# Patient Record
Sex: Male | Born: 1945 | Race: White | Hispanic: No | Marital: Married | Smoking: Never smoker
Health system: Southern US, Community
[De-identification: ages and names within clinical notes are randomized; demographics above are authoritative.]

## PROBLEM LIST (undated history)

## (undated) DIAGNOSIS — I208 Other forms of angina pectoris: Secondary | ICD-10-CM

## (undated) DIAGNOSIS — N400 Enlarged prostate without lower urinary tract symptoms: Secondary | ICD-10-CM

## (undated) DIAGNOSIS — F32A Depression, unspecified: Secondary | ICD-10-CM

## (undated) DIAGNOSIS — E7800 Pure hypercholesterolemia, unspecified: Secondary | ICD-10-CM

## (undated) DIAGNOSIS — Z860101 Personal history of adenomatous and serrated colon polyps: Secondary | ICD-10-CM

## (undated) DIAGNOSIS — Z9889 Other specified postprocedural states: Secondary | ICD-10-CM

## (undated) DIAGNOSIS — I2089 Other forms of angina pectoris: Secondary | ICD-10-CM

## (undated) DIAGNOSIS — R011 Cardiac murmur, unspecified: Secondary | ICD-10-CM

## (undated) DIAGNOSIS — E291 Testicular hypofunction: Secondary | ICD-10-CM

## (undated) DIAGNOSIS — Z9289 Personal history of other medical treatment: Secondary | ICD-10-CM

## (undated) DIAGNOSIS — C4491 Basal cell carcinoma of skin, unspecified: Secondary | ICD-10-CM

## (undated) DIAGNOSIS — J309 Allergic rhinitis, unspecified: Secondary | ICD-10-CM

## (undated) DIAGNOSIS — I10 Essential (primary) hypertension: Secondary | ICD-10-CM

## (undated) DIAGNOSIS — J302 Other seasonal allergic rhinitis: Secondary | ICD-10-CM

## (undated) DIAGNOSIS — M5030 Other cervical disc degeneration, unspecified cervical region: Secondary | ICD-10-CM

## (undated) DIAGNOSIS — Z859 Personal history of malignant neoplasm, unspecified: Secondary | ICD-10-CM

## (undated) DIAGNOSIS — Z85828 Personal history of other malignant neoplasm of skin: Secondary | ICD-10-CM

## (undated) DIAGNOSIS — M1990 Unspecified osteoarthritis, unspecified site: Secondary | ICD-10-CM

## (undated) DIAGNOSIS — F419 Anxiety disorder, unspecified: Secondary | ICD-10-CM

## (undated) DIAGNOSIS — K219 Gastro-esophageal reflux disease without esophagitis: Secondary | ICD-10-CM

## (undated) DIAGNOSIS — N529 Male erectile dysfunction, unspecified: Secondary | ICD-10-CM

## (undated) DIAGNOSIS — Z86010 Personal history of colonic polyps: Secondary | ICD-10-CM

## (undated) HISTORY — PX: CARDIAC CATHETERIZATION: SHX172

## (undated) HISTORY — PX: EYE SURGERY: SHX253

## (undated) HISTORY — DX: Male erectile dysfunction, unspecified: N52.9

## (undated) HISTORY — PX: MOHS SURGERY: SUR867

## (undated) HISTORY — PX: INGUINAL HERNIA REPAIR: SUR1180

## (undated) HISTORY — DX: Other seasonal allergic rhinitis: J30.2

## (undated) HISTORY — DX: Basal cell carcinoma of skin, unspecified: C44.91

## (undated) HISTORY — DX: Pure hypercholesterolemia, unspecified: E78.00

## (undated) HISTORY — PX: FINGER SURGERY: SHX640

## (undated) HISTORY — DX: Gastro-esophageal reflux disease without esophagitis: K21.9

## (undated) HISTORY — DX: Other forms of angina pectoris: I20.8

## (undated) HISTORY — DX: Allergic rhinitis, unspecified: J30.9

## (undated) HISTORY — DX: Other forms of angina pectoris: I20.89

## (undated) HISTORY — DX: Essential (primary) hypertension: I10

## (undated) HISTORY — PX: KNEE ARTHROSCOPY: SHX127

## (undated) HISTORY — PX: JOINT REPLACEMENT: SHX530

## (undated) HISTORY — PX: APPENDECTOMY: SHX54

## (undated) HISTORY — PX: PROSTATE BIOPSY: SHX241

## (undated) SURGERY — MONITORING, ESOPHAGEAL PH, 24 HOUR

---

## 1966-12-03 HISTORY — PX: RECONSTRUCTION OF NOSE: SHX2301

## 1970-12-03 HISTORY — PX: KNEE SURGERY: SHX244

## 2002-04-16 ENCOUNTER — Observation Stay (HOSPITAL_COMMUNITY): Admission: EM | Admit: 2002-04-16 | Discharge: 2002-04-17 | Payer: Self-pay | Admitting: Emergency Medicine

## 2002-04-16 ENCOUNTER — Encounter: Payer: Self-pay | Admitting: Emergency Medicine

## 2002-11-02 ENCOUNTER — Ambulatory Visit (HOSPITAL_BASED_OUTPATIENT_CLINIC_OR_DEPARTMENT_OTHER): Admission: RE | Admit: 2002-11-02 | Discharge: 2002-11-02 | Payer: Self-pay | Admitting: *Deleted

## 2002-11-02 ENCOUNTER — Encounter (INDEPENDENT_AMBULATORY_CARE_PROVIDER_SITE_OTHER): Payer: Self-pay | Admitting: *Deleted

## 2004-11-14 ENCOUNTER — Ambulatory Visit (HOSPITAL_BASED_OUTPATIENT_CLINIC_OR_DEPARTMENT_OTHER): Admission: RE | Admit: 2004-11-14 | Discharge: 2004-11-14 | Payer: Self-pay | Admitting: Orthopedic Surgery

## 2004-11-14 ENCOUNTER — Ambulatory Visit (HOSPITAL_COMMUNITY): Admission: RE | Admit: 2004-11-14 | Discharge: 2004-11-14 | Payer: Self-pay | Admitting: Orthopedic Surgery

## 2007-12-19 ENCOUNTER — Ambulatory Visit (HOSPITAL_BASED_OUTPATIENT_CLINIC_OR_DEPARTMENT_OTHER): Admission: RE | Admit: 2007-12-19 | Discharge: 2007-12-19 | Payer: Self-pay | Admitting: Orthopedic Surgery

## 2010-12-19 ENCOUNTER — Ambulatory Visit
Admission: RE | Admit: 2010-12-19 | Discharge: 2010-12-19 | Payer: Self-pay | Source: Home / Self Care | Attending: Orthopedic Surgery | Admitting: Orthopedic Surgery

## 2011-04-17 NOTE — Op Note (Signed)
NAME:  Dale [name], Dale [name] NO.:  [id number]   MEDICAL RECORD NO.:  [id number]          PATIENT TYPE:  AMB   LOCATION:  DSC                          FACILITY:  [facility]   PHYSICIAN:  Katy Fitch. Sypher, M.D. DATE OF BIRTH:  12-22-1945   DATE OF PROCEDURE:  12/19/2007  DATE OF DISCHARGE:                               OPERATIVE REPORT   PREOPERATIVE DIAGNOSIS:  Chronic swelling of left index finger DIP  (distal interphalangeal) joint with osteochondral loose body and  multiple mucoid cyst formation on dorsal nail fold.   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   OPERATION:  1. Arthrotomy of left index finger DIP joint with removal of large      osteochondral loose body measuring 6 x 4 mm.  2. Debridement of DIP joint of cartilage debris and removal of      osteophytes.  3. Debridement of mucoid cyst left index finger.   SURGEON:  Katy Fitch. Sypher, M.D.   ASSISTANT:  Jonni Sanger, P.A.   ANESTHESIA:  Two percent lidocaine metacarpal head level block left  index finger supplemented by IV sedation.   SUPERVISING ANESTHESIOLOGIST:  Dr. Randa Evens.   INDICATIONS:  Dale [name] is a 65 year old gentleman who has had  a history of degenerative arthritis and mucoid cyst development.  He had  a large osteophyte fracture within the left index finger DIP joint  causing an effusion and development of mucoid cyst.   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  Preoperatively he was advised of the potential risks and  benefits of surgery.  He understands we cannot alter the natural history  of his degenerative arthritis.   PROCEDURE IN DETAIL:  Dale [name] is brought to the operating  room and placed in supine position on the  operating table.   Following placement of a 2% lidocaine metacarpal head level block  satisfactory anesthesia of the left index finger was achieved.  The left  arm was prepped with Betadine soap solution and sterilely draped.  A  pneumatic tourniquet was deferred.  The left index finger was  exsanguinated with a gauze wrap and a 1/2 inch Penrose drain placed over  the proximal phalangeal segment as a digital tourniquet.   The procedure commenced with parallel incisions on the dorsal ulnar and  dorsoradial aspect of the joint.  Subcutaneous tissue were carefully  divided taking care to perform a synovectomy.  The mucoid cyst was  identified distally, drained of its contents and curetted to remove its  pseudo capsule.  The joint was entered on the dorsal ulnar aspect.  Marginal osteophytes were removed at the base of the distal phalanx and  head of the middle phalanx.  The large loose body was adherent to the  undersurface of the extensor tendon.  This was teased off the tendon  with a Therapist, nutritional followed by piecemeal removal.  The dorsoradial  aspect  of the joint was opened and marginal osteophytes removed at the  base of the distal phalanx and head of the middle phalanx followed by  thorough irrigation with a blunt dental needle.   The mucoid cysts were fully decompressed.  The joint was debrided  thoroughly.  There was bone-on-bone arthropathy at the distal  interphalangeal joint.   The wounds were then repaired with simple suture of 5-0 nylon.  A  compressive dressing was applied with Xeroflo sterile gauze and Coban.  Mr. Aikey was placed on doxycycline 100 mg p.o. b.i.d. as a  postoperative prophylactic antibiotic and will also use Vicodin 5 mg one  p.o. q.4-6 h p.r.n. pain 20 tablets without refill.      Katy Fitch Sypher, M.D.  Electronically Signed     RVS/MEDQ  D:  12/19/2007  T:  12/19/2007  Job:  [id number]   cc:   C. Duane Lope, M.D.

## 2011-04-20 NOTE — Op Note (Signed)
NAME:  Dale [name], Dale [name] NO.:  [id number]   MEDICAL RECORD NO.:  [id number]          PATIENT TYPE:  AMB   LOCATION:  DSC                          FACILITY:  [facility]   PHYSICIAN:  Katy Fitch. 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   OPERATION PERFORMED:  1.  Irrigation and debridement of distal interphalangeal joint with removal      of marginal osteophytes on the adjacent surfaces of the distal and      middle phalanges on the dorsal radial and dorsal ulnar aspect of the      right long finger distal interphalangeal joint.  2.  Resection of a subcutaneous mucous cyst along the radial aspect of the      right long finger nail fold.   SURGEON:  Katy Fitch. Sypher, M.D.   ASSISTANT:  Jonni Sanger, P.A.   ANESTHESIA:  0.25% Marcaine and 2% lidocaine metacarpal head level block of  right long finger supplemented by IV sedation.   SUPERVISING ANESTHESIOLOGIST:  Maren Beach, M.D.   INDICATIONS FOR PROCEDURE:  Dale [name] is a 65 year old man referred  by Norval Gable. [place], M.D. and Al Decant. Janey Greaser, MD for evaluation and  management of a right long finger nail deformity and chronic mucous cyst  predicament.  He has well recognized advanced osteoarthrosis of his distal  interphalangeal joints of virtually all of his fingers.  He developed a  draining mucous cyst in the past and has had prior incision and drainage  elsewhere.  He was left with a rather prominent nail  deformity.  At the  suggestion of Dr. Londell Moh, he sought a hand surgery consult.  On the  clinical examination, he was noted to have a very irregular nail plate due  to probable scarring of the nail fold as well as pressure from the mucous  cyst.  He had a palpable cyst along the dorsal radial aspect of his right  long finger distal interphalangeal joint and nail fold.  We offered  resection of the mucous cyst and joint debridement in an effort to prevent  recurrences of the mucous cyst phenomenon.  Preoperatively, he was carefully  advised that we could not guarantee that his nail deformity would improve as  this may be due to chronic scarring from his prior infection and or prior  incision and drainage.  Both he and his wife completely understood this  issue.  We did however, offer joint debridement at this time in an effort to  prevent further mucous cyst formation and drainage.  After informed consent,  Dale [name] is brought to the operating room at this time.  DESCRIPTION OF PROCEDURE:  Dale [name] was brought to the operating  room and placed in supine position on the operating table.  Following light  IV sedation, the right arm was prepped with Betadine soap and solution and  sterilely draped.  A metacarpal head level block was placed with 0.25%  Marcaine and 2% lidocaine.  When anesthesia was satisfactory, the long  finger was exsanguinated with a gauze wrap and a half inch Penrose drain  placed at the P1 segment as a digital tourniquet.  The procedure commenced  with exposure of the distal interphalangeal joint and extensor mechanism  through a lazy S incision.  Subcutaneous tissue were carefully divided  taking care to spare the dorsal sensory nerves and the dorsal veins.  A  triangular resection of the capsule between the proper radial collateral  ligament and the extensor mechanism and the proper ulnar collateral ligament  and extensor mechanism was accomplished  with a 15 blade.  Marginal  osteophytes at the base of the distal phalanx and middle phalanx were  debrided with a microcurette and fine rongeur.  The joint was then irrigated  through-and-through with saline until the effluent was clear of debris and  mucinous material.  A fine rongeur was then used to debride the subcutaneous  region of the radial nail fold.  Care was taken not to injure the dorsal,  intermediate or ventral nail matrix.  The wound was carefully inspected for  bleeding points and subsequently repaired with mattress sutures of 5-0  nylon.  A compressive dressing was applied with Xeroflo, sterile gauze and a  Coban dressing.  Dale [name] was given 1 g of Ancef as an IV  prophylactic antibiotic anticipating intra-articular procedure, also this  was indicated as he had a past history of infection of the nail fold.   For aftercare he was placed on Levaquin 500 mg one by mouth daily times four  days as a prophylactic antibiotic.  He was also given Vicodin 5 mg one by  mouth every four to six hours as needed for pain, 20 tablets without refill.  He will return to our office for follow-up in one week or sooner as needed  for problems.  He is carefully advised to keep his finger dressing  completely dry.      [place]obe   RVS/MEDQ  D:  11/14/2004  T:  11/14/2004  Job:  [id number]   cc:   Norval Gable. [place], M.D.  [address] [place]  [place] [address]  Fax: [phone]   Al Decant. Janey Greaser, MD  [address]  [place]  [place] [address]  Fax: [phone]

## 2011-04-20 NOTE — Op Note (Signed)
NAME:  Dale [name], Dale [name]                    ACCOUNT NO.:  [id number]   MEDICAL RECORD NO.:  [id number]                   PATIENT TYPE:  AMB   LOCATION:  DSC                                  FACILITY:  [facility]   PHYSICIAN:  Maisie Fus B. Samuella Cota, M.D.               DATE OF BIRTH:  10/19/1946   DATE OF PROCEDURE:  11/02/2002  DATE OF DISCHARGE:                                 OPERATIVE REPORT   CCS# [id number]   PREOPERATIVE DIAGNOSIS:  Right inguinal hernia.   POSTOPERATIVE DIAGNOSIS:  Right inguinal hernia.   OPERATION:  Repair of right inguinal hernia with mesh.   SURGEON:  Maisie Fus B. Samuella Cota, M.D.   ANESTHESIA:  Local (1% Xylocaine without epinephrine, 0.25% Marcaine without  epinephrine and sodium bicarbonate) with anesthesia monitoring.   ANESTHESIOLOGIST:  Bedelia Person, M.D. and CRNA   DESCRIPTION OF PROCEDURE:  The patient was taken to the operating room and  placed on the table in the supine position.  The right lower quadrant of the  abdomen was prepped and draped as a sterile field.  The patient had a  previous left inguinal herniorrhaphy, given a block incision.  A matching  incision was outlined with a skin marker.  The local was then used to block  the ilioinguinal nerve in the area for the incision.  Once the ilioinguinal  nerve was exposed, it was blocked directly.  The incision was made through  the skin and subcutaneous tissue with subcutaneous bleeders being cauterized  with the Bovie or ligated with 3-0 Vicryl.  External oblique aponeurosis was  divided in the line of its fibers to include the external ring.  The  ilioinguinal nerve was seen and retracted laterally.  The cord structures  were isolated with a Penrose drain.  The patient was noted to have fairly  large direct inguinal hernia coming off below the recurrent epigastric  vessels.  Above the recurrent epigastric vessels, the patient did have an  indirect hernia sac which was very thin walled.  This was dissected  free  from the cord structures.  The sac was opened and some small bowel was noted  in the abdominal cavity but no bowel was adherent to the hernia sac.  Under  direct vision, the high ligation was carried out with the suture ligature of  0 chromic catgut.  The stump was allowed to retract beneath the transversus  muscle.  The direct hernia was then imbricated with a running suture of 0  chromic catgut.  A piece of 3 inch x 6 inch atria mesh was then fashioned to  cover the entire inguinal floor and extend around the cord structure  superiorly and laterally.  The mesh was anchored inferiorly with a running  suture of 2-0 Novofil with the first suture being placed in the pubic  tubercle.  The other sutures in the shelving edge of Poupart's ligament.  The mesh was anchored anteriorly superiorly and  medially with interrupted  sutures of 0 Novofil.  A slit was made in the mesh to accommodate the cord  structures and the ilioinguinal nerve at the internal ring.  The mesh was  then sutured to itself superior and lateral to the internal ring.  The mesh  seemed to be lying nicely covering the inguinal floor without too much  tension.  The cord structures and ilioinguinal nerve were then returned to  their normal anatomical position and the external oblique aponeurosis was  reapproximated with interrupted sutures of 3-0 Vicryl.  The external ring  would easily admit the tip of the index finger.  Scarpa's fascia  was then closed with 3-0 Vicryl and the skin was closed with a running  subcuticular suture of 4-0 Monocryl.  Benzoin and 1/2 inch Steri-Strips were  used to reinforce the skin closure.  Dry sterile dressing was applied.  The  patient seemed to tolerate the procedure well and was taken to the PACU in  satisfactory condition.                                               Tailor B. Samuella Cota, M.D.    TBP/MEDQ  D:  11/02/2002  T:  11/02/2002  Job:  [id number]   cc:   Al Decant. Janey Greaser, M.D.   [address]  [place]  [place] [address]  Fax: [phone]

## 2011-08-23 LAB — POCT HEMOGLOBIN-HEMACUE: Hemoglobin: 14.2

## 2012-08-21 ENCOUNTER — Other Ambulatory Visit: Payer: Self-pay | Admitting: Family Medicine

## 2012-08-21 DIAGNOSIS — Z0000 Encounter for general adult medical examination without abnormal findings: Secondary | ICD-10-CM

## 2012-08-25 ENCOUNTER — Ambulatory Visit
Admission: RE | Admit: 2012-08-25 | Discharge: 2012-08-25 | Disposition: A | Discharge status: Home / Self Care | Payer: Medicare Other | Source: Ambulatory Visit | Attending: Family Medicine | Admitting: Family Medicine

## 2012-08-25 DIAGNOSIS — Z0000 Encounter for general adult medical examination without abnormal findings: Secondary | ICD-10-CM

## 2012-08-25 IMAGING — US US AORTA SCREENING (MEDICARE)
1 series · 14 of 14 positions shown · non-contrast
Comparison: None.

CLINICAL DATA: Routine general medical exam.  Screening for AAA.

ABDOMINAL AORTA SCREENING ULTRASOUND
TECHNIQUE: Ultrasound examination of the abdominal aorta was
performed as a screening evaluation for abdominal aortic aneurysm.

[Series 1: us aorta screening (medicare) · 0.30mm/px · 14 of 14 slices shown]
[im 1/14]
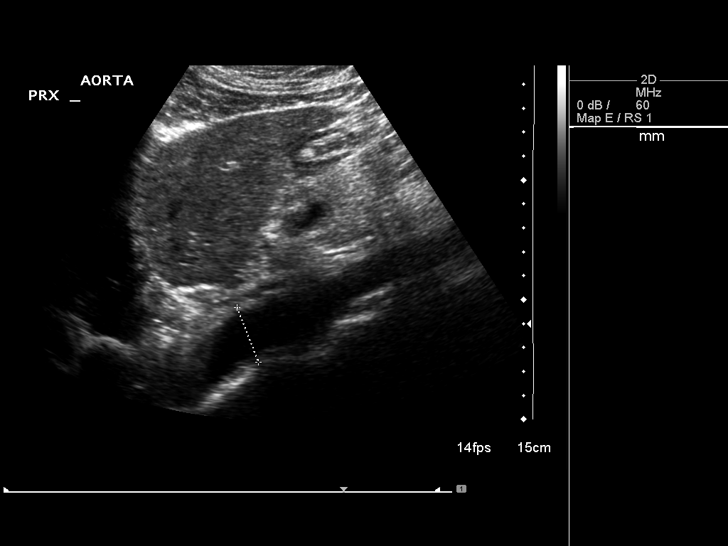
[im 2/14]
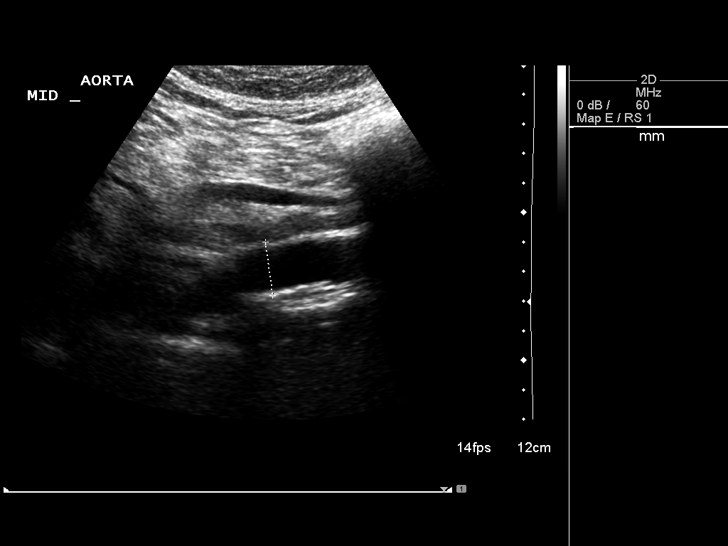
[im 3/14]
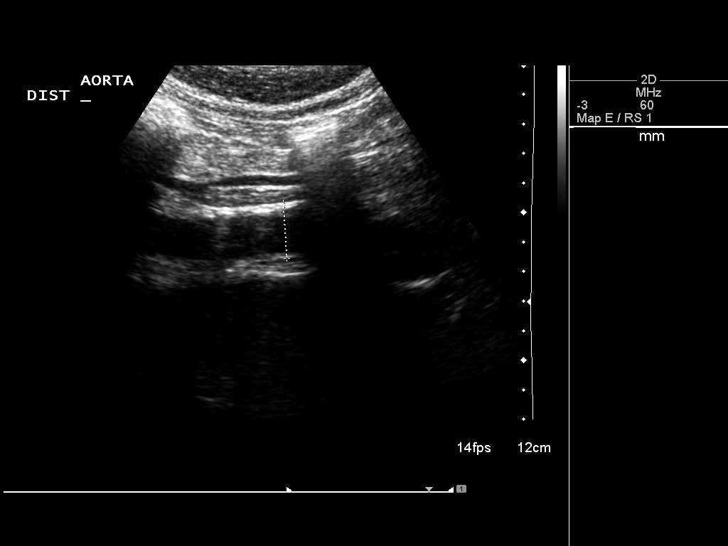
[im 4/14]
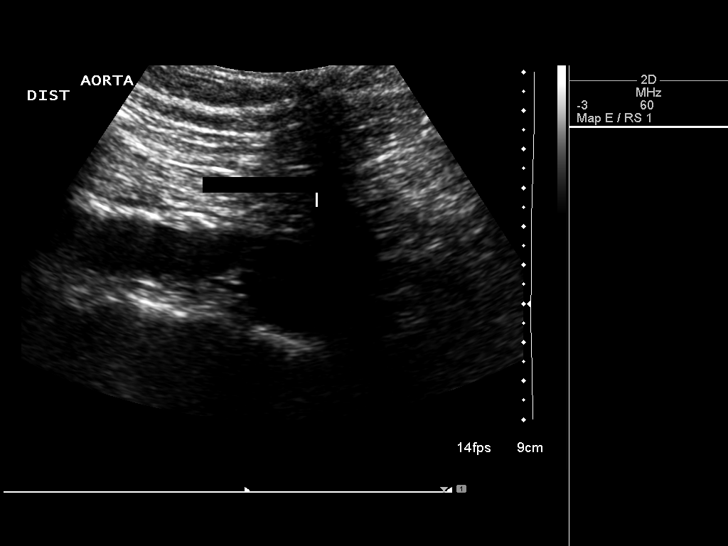
[im 5/14]
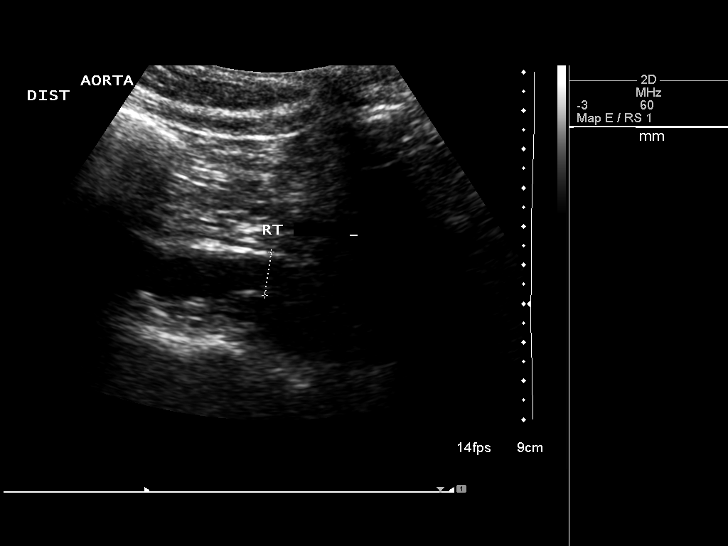
[im 6/14]
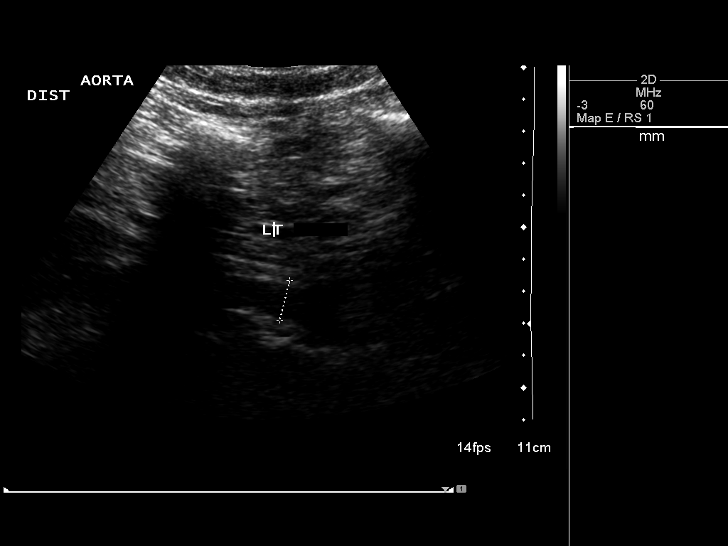
[im 7/14]
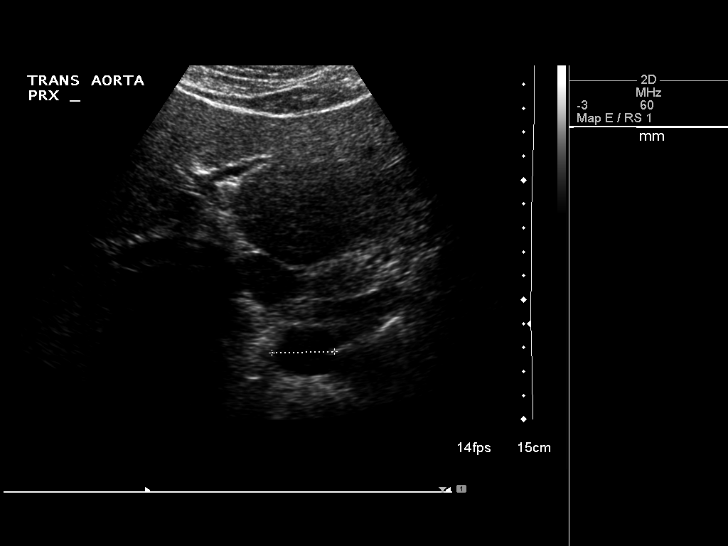
[im 8/14]
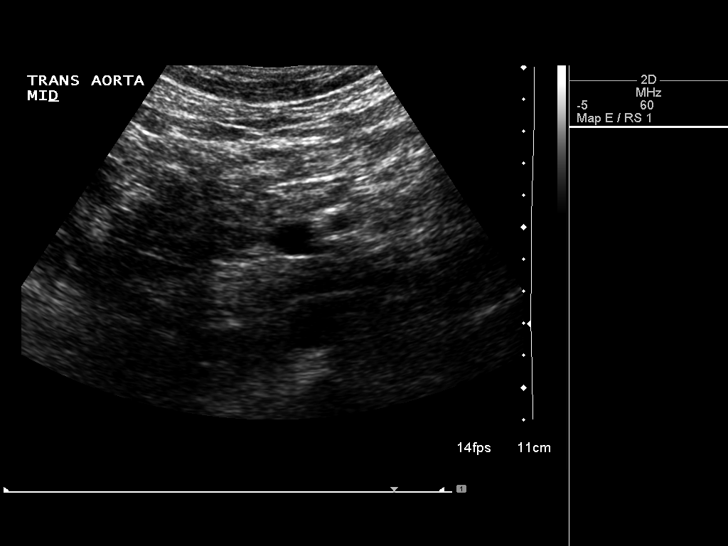
[im 9/14]
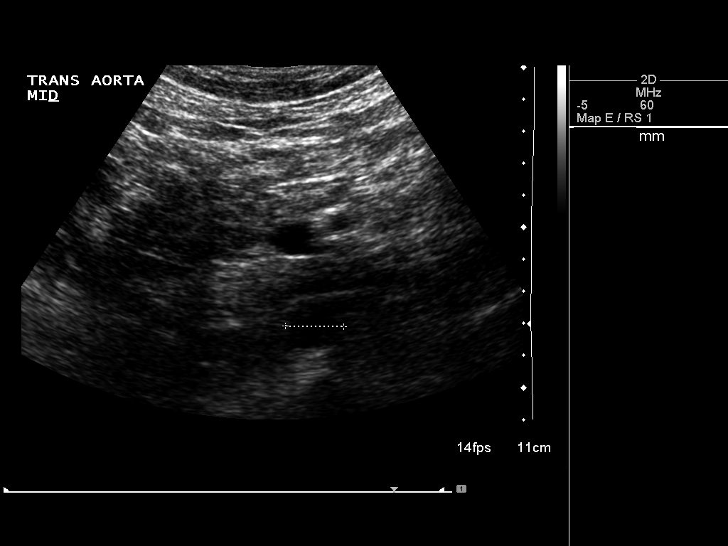
[im 10/14]
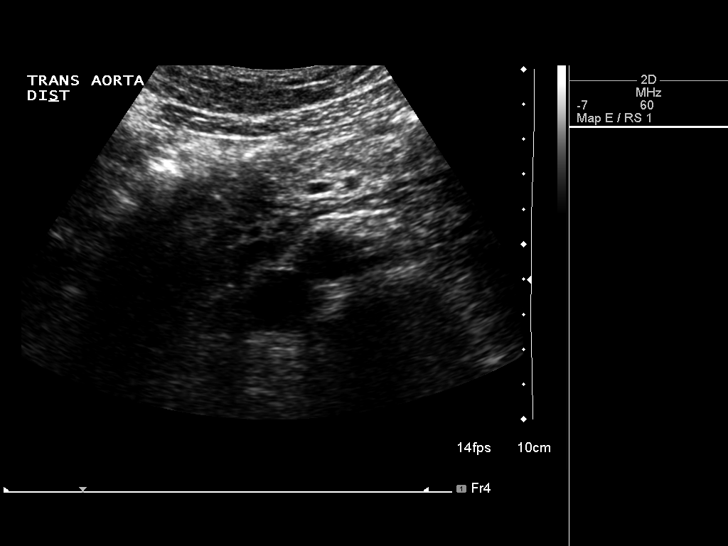
[im 11/14]
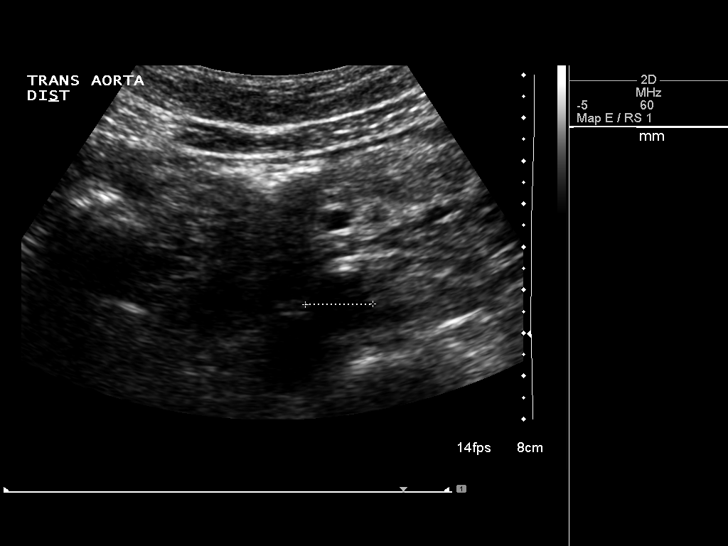
[im 12/14]
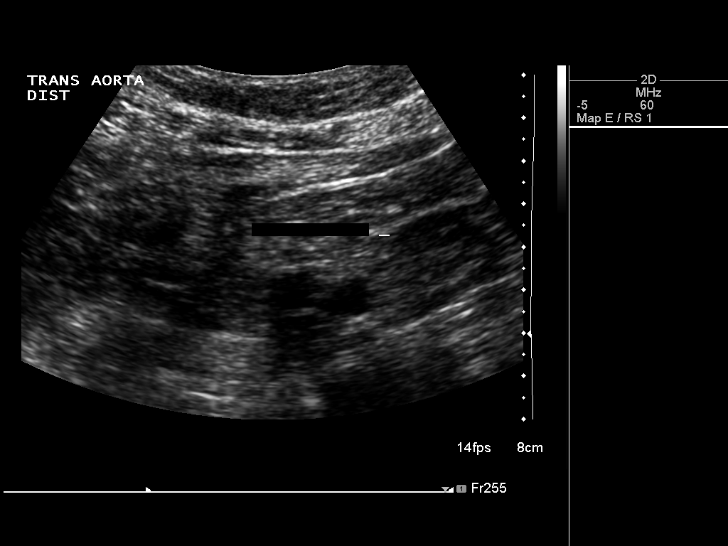
[im 13/14]
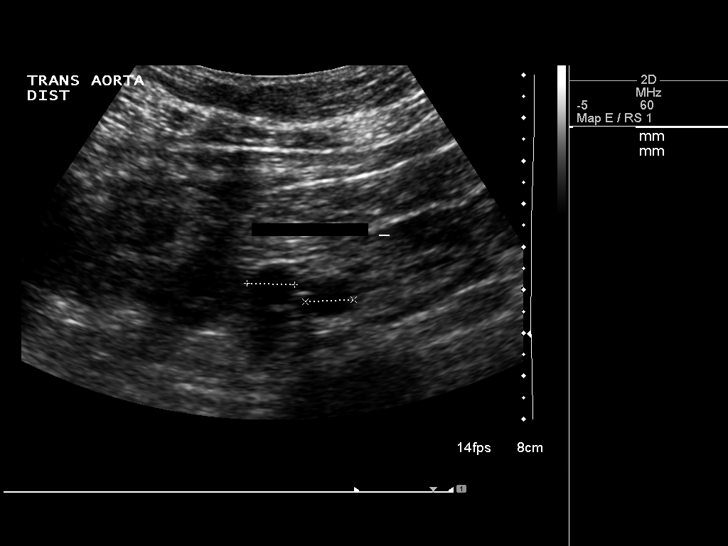
[im 14/14]
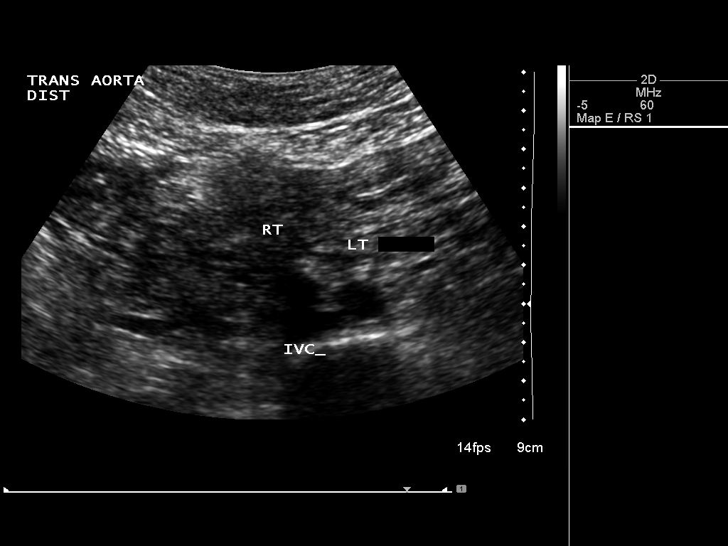

[14 of 14 positions shown; findings below may reference images not displayed]

Abdominal Aorta: No aneurysm identified.

      Maximum AP diameter:  2.5 cm.
      Maximum TRV diameter:  2.6 cm.
IMPRESSION: No abdominal aortic aneurysm identified.

## 2012-12-24 ENCOUNTER — Other Ambulatory Visit: Payer: Self-pay | Admitting: Family Medicine

## 2012-12-24 DIAGNOSIS — R1031 Right lower quadrant pain: Secondary | ICD-10-CM

## 2012-12-26 ENCOUNTER — Ambulatory Visit
Admission: RE | Admit: 2012-12-26 | Discharge: 2012-12-26 | Disposition: A | Discharge status: Home / Self Care | Payer: Medicare Other | Source: Ambulatory Visit | Attending: Family Medicine | Admitting: Family Medicine

## 2012-12-26 DIAGNOSIS — R1031 Right lower quadrant pain: Secondary | ICD-10-CM

## 2012-12-26 IMAGING — US US ABDOMEN COMPLETE
1 series · 14 of 25 positions shown · non-contrast
Comparison: [DATE]

CLINICAL DATA: Right lower quadrant pain.

COMPLETE ABDOMINAL ULTRASOUND

[Series 1: us abdomen complete · 0.37mm/px · 14 of 76 slices shown]
[im 1/76]
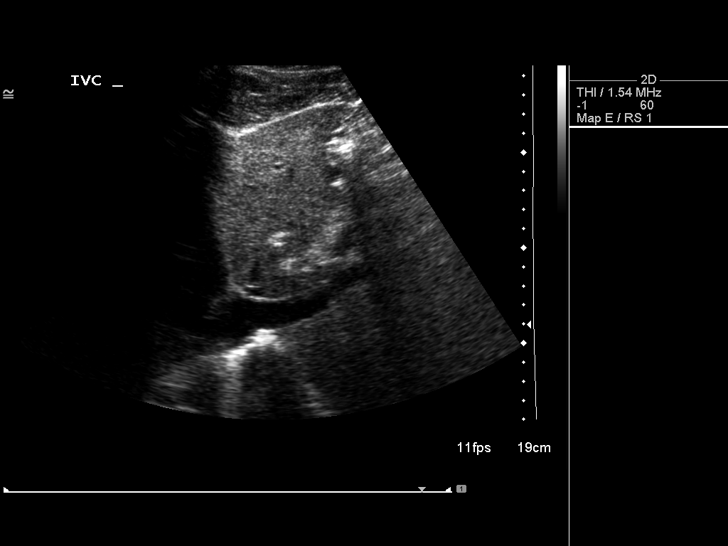
[im 7/76]
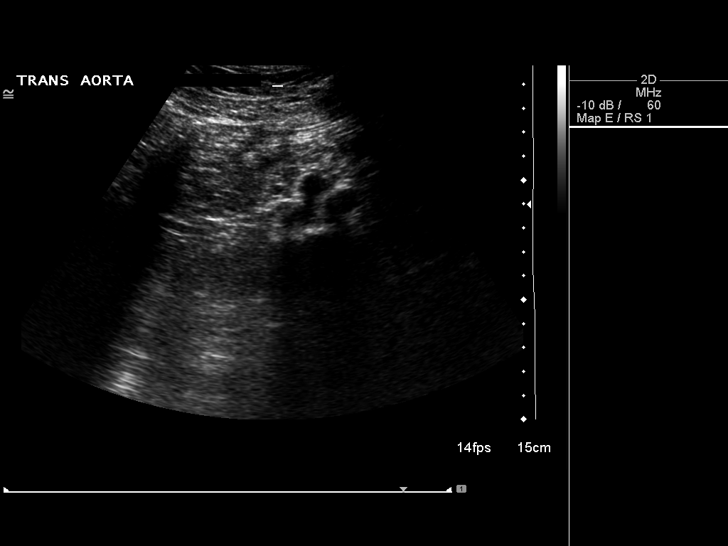
[im 13/76]
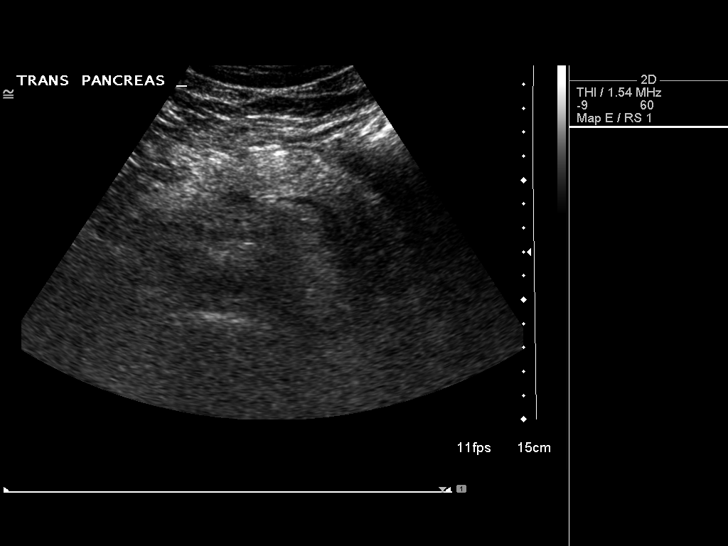
[im 19/76]
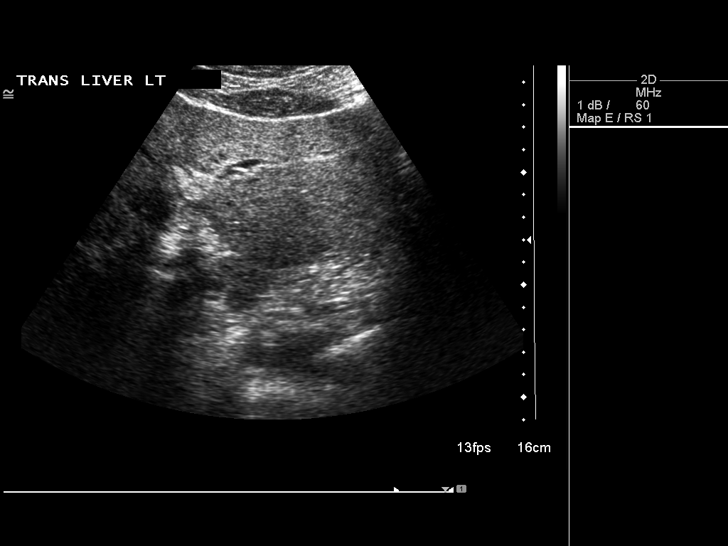
[im 26/76]
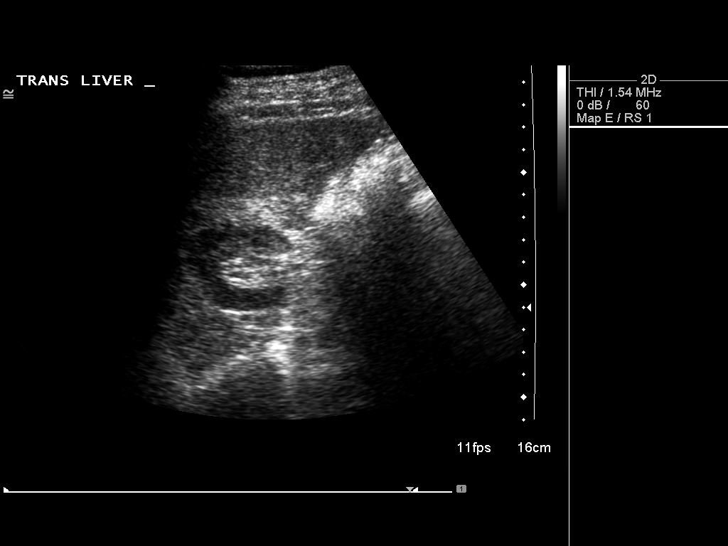
[im 29/76]
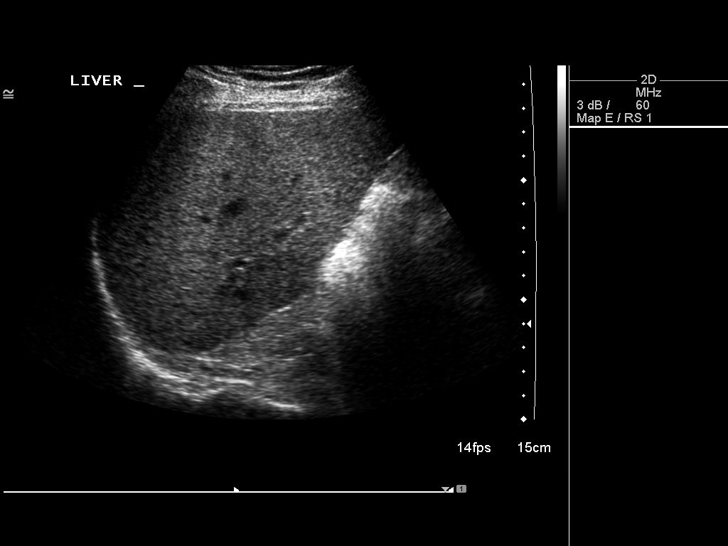
[im 35/76]
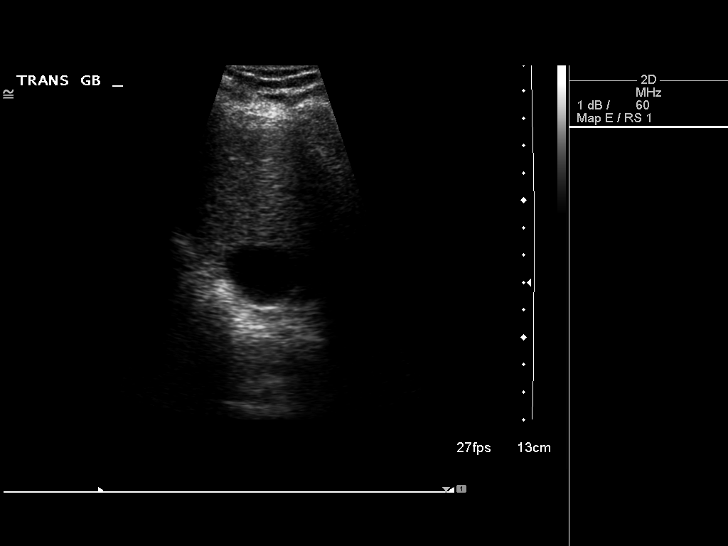
[im 41/76]
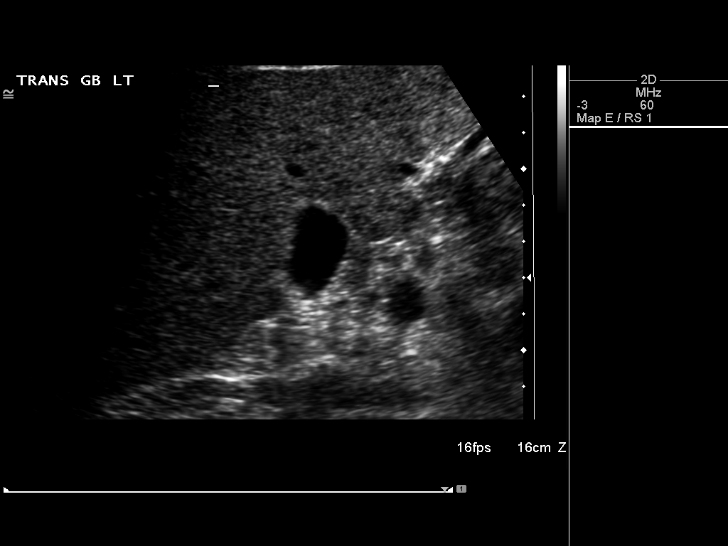
[im 47/76]
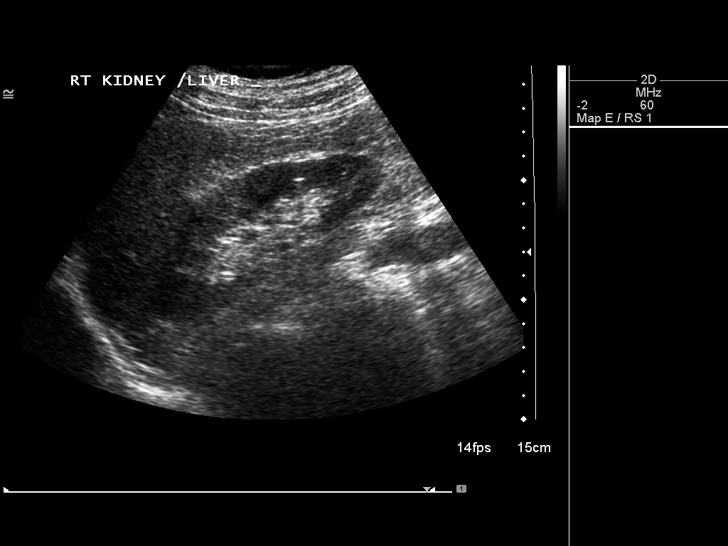
[im 51/76]
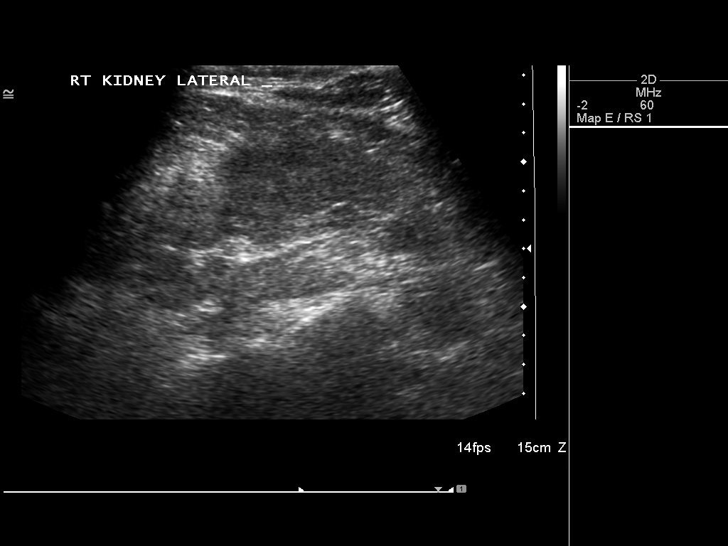
[im 57/76]
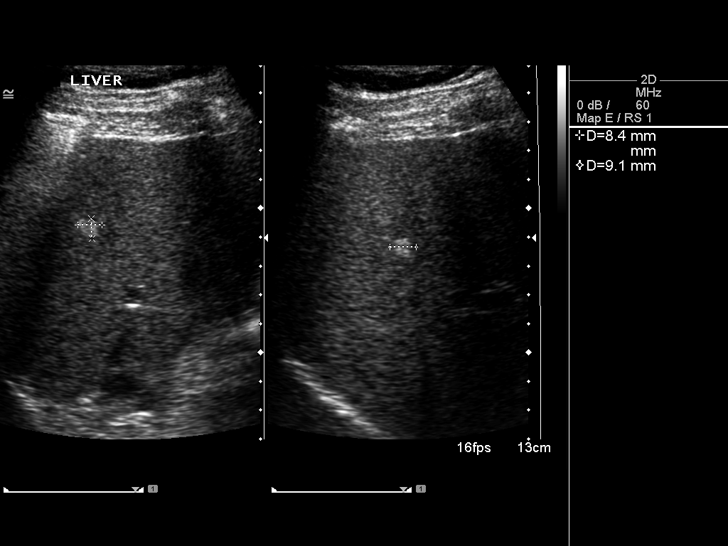
[im 63/76]
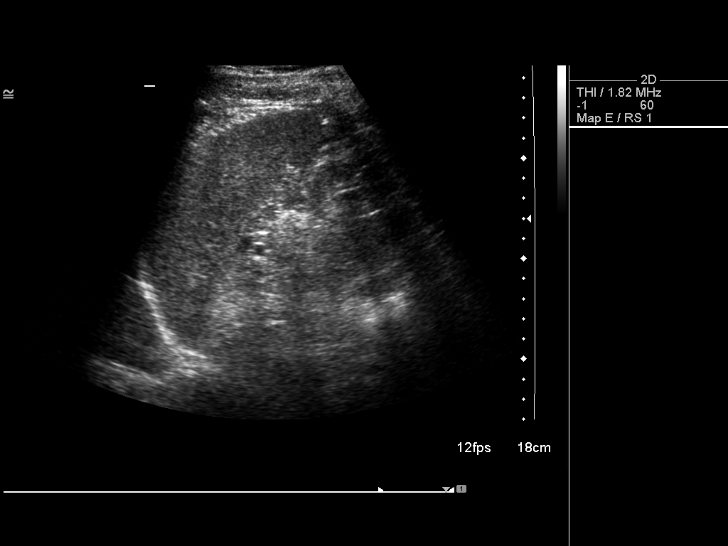
[im 69/76]
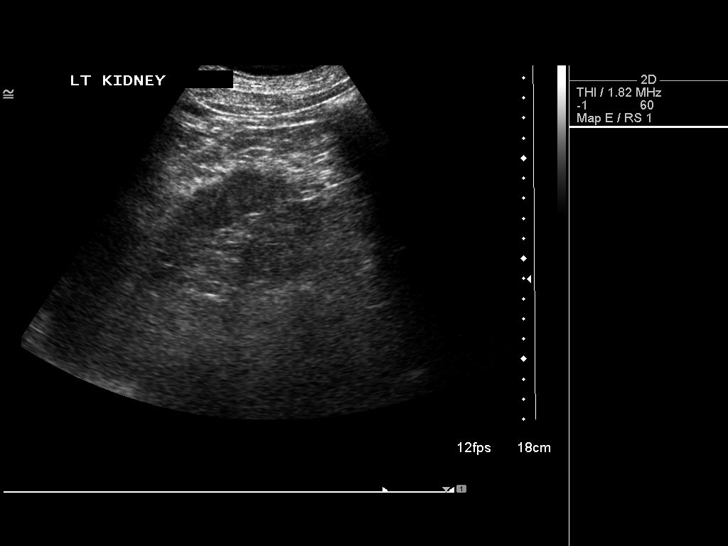
[im 76/76]
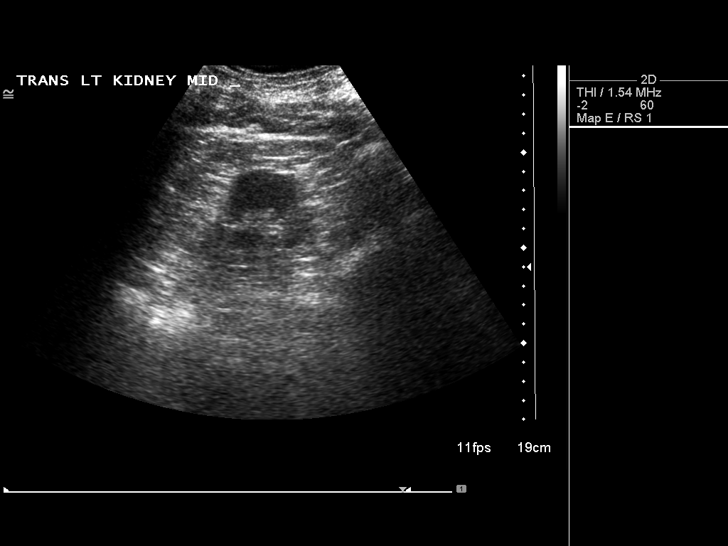

[14 of 25 positions shown; findings below may reference images not displayed]

FINDINGS: Gallbladder:  No stones or wall thickening.  Negative sonographic
CHANNING.

Common bile duct:   Normal caliber, 4 mm.

Liver:  9 mm hyperechoic focus within the right lobe of the liver,
likely small hemangioma.

IVC:  Appears normal.

Pancreas:  Suboptimal visualization of the head and tail.
Pancreatic body unremarkable.

Spleen:  Within normal limits in size and echotexture.

Right Kidney:   Normal in size and parenchymal echogenicity.  No
evidence of mass or hydronephrosis.

Left Kidney:  Normal in size and parenchymal echogenicity.  No
evidence of mass or hydronephrosis.

Abdominal aorta:  No aneurysm.
IMPRESSION: No acute findings in the abdomen.

Small hyperechoic area within the right lobe of the liver, likely
hemangioma.

## 2013-04-24 ENCOUNTER — Other Ambulatory Visit: Payer: Self-pay | Admitting: Interventional Cardiology

## 2013-04-28 ENCOUNTER — Ambulatory Visit (HOSPITAL_COMMUNITY)
Admission: RE | Admit: 2013-04-28 | Payer: Medicare Other | Source: Ambulatory Visit | Admitting: Interventional Cardiology

## 2013-04-28 ENCOUNTER — Encounter (HOSPITAL_COMMUNITY): Admission: RE | Payer: Self-pay | Source: Ambulatory Visit

## 2013-04-28 SURGERY — LEFT HEART CATHETERIZATION WITH CORONARY ANGIOGRAM
Anesthesia: LOCAL

## 2013-05-01 ENCOUNTER — Inpatient Hospital Stay (HOSPITAL_BASED_OUTPATIENT_CLINIC_OR_DEPARTMENT_OTHER)
Admission: RE | Admit: 2013-05-01 | Payer: Medicare Other | Source: Ambulatory Visit | Admitting: Interventional Cardiology

## 2013-05-01 ENCOUNTER — Encounter (HOSPITAL_COMMUNITY)
Admission: RE | Disposition: A | Discharge status: Home / Self Care | Payer: Self-pay | Source: Ambulatory Visit | Attending: Interventional Cardiology

## 2013-05-01 ENCOUNTER — Encounter (HOSPITAL_BASED_OUTPATIENT_CLINIC_OR_DEPARTMENT_OTHER): Admission: RE | Payer: Self-pay | Source: Ambulatory Visit

## 2013-05-01 ENCOUNTER — Ambulatory Visit (HOSPITAL_COMMUNITY)
Admission: RE | Admit: 2013-05-01 | Discharge: 2013-05-01 | Disposition: A | Discharge status: Home / Self Care | Payer: Medicare Other | Source: Ambulatory Visit | Attending: Interventional Cardiology | Admitting: Interventional Cardiology

## 2013-05-01 DIAGNOSIS — Z79899 Other long term (current) drug therapy: Secondary | ICD-10-CM | POA: Insufficient documentation

## 2013-05-01 DIAGNOSIS — I2510 Atherosclerotic heart disease of native coronary artery without angina pectoris: Secondary | ICD-10-CM | POA: Insufficient documentation

## 2013-05-01 DIAGNOSIS — E7800 Pure hypercholesterolemia, unspecified: Secondary | ICD-10-CM | POA: Insufficient documentation

## 2013-05-01 DIAGNOSIS — Z823 Family history of stroke: Secondary | ICD-10-CM | POA: Insufficient documentation

## 2013-05-01 DIAGNOSIS — Z791 Long term (current) use of non-steroidal anti-inflammatories (NSAID): Secondary | ICD-10-CM | POA: Insufficient documentation

## 2013-05-01 DIAGNOSIS — K219 Gastro-esophageal reflux disease without esophagitis: Secondary | ICD-10-CM | POA: Insufficient documentation

## 2013-05-01 DIAGNOSIS — I208 Other forms of angina pectoris: Secondary | ICD-10-CM | POA: Diagnosis present

## 2013-05-01 DIAGNOSIS — I739 Peripheral vascular disease, unspecified: Secondary | ICD-10-CM | POA: Insufficient documentation

## 2013-05-01 DIAGNOSIS — R9439 Abnormal result of other cardiovascular function study: Secondary | ICD-10-CM | POA: Insufficient documentation

## 2013-05-01 DIAGNOSIS — Z887 Allergy status to serum and vaccine status: Secondary | ICD-10-CM | POA: Insufficient documentation

## 2013-05-01 DIAGNOSIS — I10 Essential (primary) hypertension: Secondary | ICD-10-CM | POA: Insufficient documentation

## 2013-05-01 DIAGNOSIS — Z880 Allergy status to penicillin: Secondary | ICD-10-CM | POA: Insufficient documentation

## 2013-05-01 DIAGNOSIS — E785 Hyperlipidemia, unspecified: Secondary | ICD-10-CM | POA: Insufficient documentation

## 2013-05-01 DIAGNOSIS — Z85828 Personal history of other malignant neoplasm of skin: Secondary | ICD-10-CM | POA: Insufficient documentation

## 2013-05-01 DIAGNOSIS — N529 Male erectile dysfunction, unspecified: Secondary | ICD-10-CM | POA: Insufficient documentation

## 2013-05-01 HISTORY — PX: LEFT HEART CATHETERIZATION WITH CORONARY ANGIOGRAM: SHX5451

## 2013-05-01 SURGERY — JV LEFT HEART CATHETERIZATION WITH CORONARY ANGIOGRAM

## 2013-05-01 SURGERY — LEFT HEART CATHETERIZATION WITH CORONARY ANGIOGRAM
Anesthesia: LOCAL

## 2013-05-01 MED ORDER — LIDOCAINE HCL (PF) 1 % IJ SOLN
INTRAMUSCULAR | Status: AC
  Filled 2013-05-01: qty 30

## 2013-05-01 MED ORDER — DIAZEPAM 5 MG PO TABS
5.0000 mg | ORAL_TABLET | ORAL | Status: AC
  Administered 2013-05-01: 5 mg via ORAL
  Filled 2013-05-01: qty 1

## 2013-05-01 MED ORDER — SODIUM CHLORIDE 0.9 % IV SOLN: INTRAVENOUS | Status: DC

## 2013-05-01 MED ORDER — FENTANYL CITRATE 0.05 MG/ML IJ SOLN
INTRAMUSCULAR | Status: AC
  Filled 2013-05-01: qty 2

## 2013-05-01 MED ORDER — ASPIRIN 81 MG PO CHEW
324.0000 mg | CHEWABLE_TABLET | ORAL | Status: AC
  Administered 2013-05-01: 324 mg via ORAL
  Filled 2013-05-01: qty 4

## 2013-05-01 MED ORDER — MIDAZOLAM HCL 2 MG/2ML IJ SOLN
INTRAMUSCULAR | Status: AC
  Filled 2013-05-01: qty 2

## 2013-05-01 MED ORDER — SODIUM CHLORIDE 0.9 % IV SOLN
INTRAVENOUS | Status: DC
Start: 2013-05-01 — End: 2013-05-01
  Administered 2013-05-01: 08:00:00 via INTRAVENOUS

## 2013-05-01 MED ORDER — OXYCODONE-ACETAMINOPHEN 5-325 MG PO TABS: 1.0000 | ORAL_TABLET | ORAL | Status: DC | PRN

## 2013-05-01 MED ORDER — HEPARIN (PORCINE) IN NACL 2-0.9 UNIT/ML-% IJ SOLN
INTRAMUSCULAR | Status: AC
Start: 2013-05-01 — End: 2013-05-01
  Filled 2013-05-01: qty 1000

## 2013-05-01 MED ORDER — SODIUM CHLORIDE 0.9 % IV SOLN: 250.0000 mL | INTRAVENOUS | Status: DC | PRN

## 2013-05-01 MED ORDER — ONDANSETRON HCL 4 MG/2ML IJ SOLN: 4.0000 mg | Freq: Four times a day (QID) | INTRAMUSCULAR | Status: DC | PRN

## 2013-05-01 MED ORDER — ACETAMINOPHEN 325 MG PO TABS: 650.0000 mg | ORAL_TABLET | ORAL | Status: DC | PRN

## 2013-05-01 MED ORDER — SODIUM CHLORIDE 0.9 % IJ SOLN: 3.0000 mL | INTRAMUSCULAR | Status: DC | PRN

## 2013-05-01 MED ORDER — SODIUM CHLORIDE 0.9 % IJ SOLN: 3.0000 mL | Freq: Two times a day (BID) | INTRAMUSCULAR | Status: DC

## 2013-05-01 MED ORDER — NITROGLYCERIN IN D5W 200-5 MCG/ML-% IV SOLN
INTRAVENOUS | Status: AC
  Filled 2013-05-01: qty 250

## 2013-05-01 NOTE — CV Procedure (Signed)
     Diagnostic Cardiac Catheterization Report  Dale [name]  67 y.o.  male 06-26-1946  Procedure Date: 05/01/2013 Referring Physician: Duane Lope, MD Primary Cardiologist: HWBSmith, III, MD   PROCEDURE:  Left heart catheterization with selective coronary angiography, left ventriculogram.  INDICATIONS:  Exertional angina, abnormal nuclear study with inferolateral and apical ischemia, and nitroglycerin responsive chest discomfort.  The risks, benefits, and details of the procedure were explained to the patient.  The patient verbalized understanding and wanted to proceed.  Informed written consent was obtained.  PROCEDURE TECHNIQUE:  After Xylocaine anesthesia we will unable to successfully enter the right radial. 3 passes were made each time hitting the artery but being unable to thread the wire, likely related to vascular spasm.  We then switched to the femoral approach and placed a 5 French sheath in the right femoral artery with a single anterior needle wall stick.   Coronary angiography was done using a 5 French left and right #4 Judkins catheters.  Left ventriculography was done using a 5 [place] A2 MP catheter.    The maintenance hemostasis device was used with success.   CONTRAST:  Total of 115 cc.  COMPLICATIONS:  None.    HEMODYNAMICS:  Aortic pressure was 134/70 mmHg; LV pressure was 134/2 mmHg; LVEDP 5 mmHg.  There was no gradient between the left ventricle and aorta.    ANGIOGRAPHIC DATA:   The left main coronary artery is mildly calcified but widely patent..  The left anterior descending artery is moderately calcified with-use luminal irregularity from proximal to mid vessel. No focal obstruction is noted. Tortuosity is noted in the mid and distal vessel. 4 diagonal branches are patent and free of any significant obstruction. The ostial LAD contains less than 25% narrowing..  The left circumflex artery is essentially a large bifurcating obtuse marginal that  demonstrates no significant obstruction.  The right coronary artery is dominant giving 3 left ventricular branches. The initial image demonstrated diaphyses narrowing/constriction of the PDA and left ventricular branches. Intracoronary led to significant increase in vessel diameter. No obstructive disease is noted in the right coronary  LEFT VENTRICULOGRAM:  Left ventricular angiogram was done in the 30 RAO projection and revealed normal left ventricular wall motion and systolic function with an estimated ejection fraction of 60 %.    IMPRESSIONS:  1. No significant obstructive coronary disease is noted. Left coronary artery calcification -- diffuse plaquing is seen. 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  2. Inability to perform procedure from the right radial approach due to radial artery spasm preventing guidewire advancement on 3 separate occasions.  3. Normal left ventricular function and hemodynamics   RECOMMENDATION:  Medical therapy including aggressive risk factor modification with statin therapy, beta blocker therapy, and consideration of switching ACE inhibitor therapy to amlodipine to have a greater anti- vasoconstriction effect. Resume typical activity. Avoid pseudoephedrine.

## 2013-05-01 NOTE — H&P (Signed)
The patient has a several month history of exertional chest discomfort and an abnormal nuclear study showing the lateral and apical ischemia. He continues to have symptoms despite up titration of medical therapy with beta blocker. The studies being done to identify his anatomy and guide therapy.  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

## 2013-10-06 ENCOUNTER — Encounter: Payer: Self-pay | Admitting: Interventional Cardiology

## 2013-12-23 ENCOUNTER — Ambulatory Visit: Payer: Medicare Other | Admitting: Interventional Cardiology

## 2013-12-28 ENCOUNTER — Encounter: Payer: Self-pay | Admitting: Interventional Cardiology

## 2013-12-28 ENCOUNTER — Encounter: Payer: Self-pay | Admitting: *Deleted

## 2013-12-28 DIAGNOSIS — K219 Gastro-esophageal reflux disease without esophagitis: Secondary | ICD-10-CM | POA: Insufficient documentation

## 2013-12-28 DIAGNOSIS — E7800 Pure hypercholesterolemia, unspecified: Secondary | ICD-10-CM | POA: Insufficient documentation

## 2013-12-28 DIAGNOSIS — J309 Allergic rhinitis, unspecified: Secondary | ICD-10-CM | POA: Insufficient documentation

## 2013-12-28 DIAGNOSIS — N529 Male erectile dysfunction, unspecified: Secondary | ICD-10-CM | POA: Insufficient documentation

## 2013-12-28 DIAGNOSIS — I10 Essential (primary) hypertension: Secondary | ICD-10-CM | POA: Insufficient documentation

## 2014-01-04 ENCOUNTER — Ambulatory Visit (INDEPENDENT_AMBULATORY_CARE_PROVIDER_SITE_OTHER): Payer: Medicare Other | Admitting: Interventional Cardiology

## 2014-01-04 ENCOUNTER — Encounter: Payer: Self-pay | Admitting: Interventional Cardiology

## 2014-01-04 VITALS — BP 96/62 | HR 65 | Ht 67.0 in | Wt 147.8 lb

## 2014-01-04 DIAGNOSIS — I208 Other forms of angina pectoris: Secondary | ICD-10-CM

## 2014-01-04 DIAGNOSIS — I493 Ventricular premature depolarization: Secondary | ICD-10-CM | POA: Insufficient documentation

## 2014-01-04 DIAGNOSIS — I10 Essential (primary) hypertension: Secondary | ICD-10-CM

## 2014-01-04 DIAGNOSIS — I209 Angina pectoris, unspecified: Secondary | ICD-10-CM

## 2014-01-04 DIAGNOSIS — E7800 Pure hypercholesterolemia, unspecified: Secondary | ICD-10-CM

## 2014-01-04 DIAGNOSIS — I4949 Other premature depolarization: Secondary | ICD-10-CM

## 2014-01-04 MED ORDER — METOPROLOL SUCCINATE ER 50 MG PO TB24: 50.0000 mg | ORAL_TABLET | Freq: Every day | ORAL | Status: DC

## 2014-01-04 NOTE — Patient Instructions (Signed)
Your physician recommends that you continue on your current medications as directed. Please refer to the Current Medication list given to you today.  A refill for Metoprolol has been sent to your pharmacy  Your physician wants you to follow-up in: 1 year You will receive a reminder letter in the mail two months in advance. If you don't receive a letter, please call our office to schedule the follow-up appointment.

## 2014-01-04 NOTE — Progress Notes (Signed)
Patient ID: Dale [name], male   DOB: June 08, 1946, 68 y.o.   MRN: [id number] Past Medical History  GERD   Hyperlipidemia   HTN   Episodic arthritis   Allergies, seasonal   Colonoscopy, 2009, tubular adenoma, repeat 3 yr   Basal cell cancer, Dermatology follows- Dr Sherrye Payor   Nasal surgery   Bilateral inguinal hernia repair   Bilateral knee scopes      [address] [place],   [address] Phone: [phone] Fax:  [phone]  Date:  01/04/2014   ID:  Dale [name], DOB 20-Dec-1945, MRN [id number]  PCP:  Marcello Fennel, MD   ASSESSMENT:  1. Angina pectoris with angiographically patent coronary arteries. Presumed secondary to coronary vasoconstriction/microvascular disease. 2. Isolated PVC 3. Hypertension, and now with relatively low blood pressure after losing 15 pounds. 4. Hyperlipidemia  PLAN:  1. Continue current medical regimen, but consider discontinuing ACE inhibitor therapy 2. I will continue the beta blocker for blood pressure control and PVC suppression   SUBJECTIVE: Dale [name] is a 68 y.o. male who has widely patent coronaries but diffuse coronary spasm documented by catheterization. No angina since medication adjustments. He has lost weight and notes orthostatic dizziness. He monitors his blood pressure and has noted a significant decline in systolic pressure. 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 Exertional angina   . Abnormal nuclear stress test   . Hypercholesteremia   . GERD (gastroesophageal reflux disease)   . HTN (hypertension)   . ED (erectile dysfunction)   . Allergic rhinitis   . ED (erectile dysfunction)     Current  Outpatient Prescriptions  Medication Sig Dispense Refill  . aspirin EC 81 MG tablet Take 81 mg by mouth daily.      Marland Kitchen atorvastatin (LIPITOR) 10 MG tablet Take 10 mg by mouth at bedtime.      . Calcium Carbonate Antacid (TUMS E-X PO) Take 1,000 mg by mouth daily as needed (for stomach heartburn).      . diclofenac sodium (VOLTAREN) 1 % GEL Apply 2 g topically daily as needed (for muscle pains).      . fluticasone (FLONASE) 50 MCG/ACT nasal spray Place 1 spray into the nose daily as needed for rhinitis or allergies.      Marland Kitchen lisinopril (PRINIVIL,ZESTRIL) 10 MG tablet Take 5 mg by mouth daily.      . metaxalone (SKELAXIN) 800 MG tablet Take 800 mg by mouth daily as needed for pain.      . metoprolol succinate (TOPROL-XL) 50 MG 24 hr tablet Take 50 mg by mouth daily. Take with or immediately following a meal.      . nitroGLYCERIN (NITROSTAT) 0.4 MG SL tablet Place 0.4 mg under the tongue every 5 (five) minutes as needed for chest pain.      . pantoprazole (PROTONIX) 40 MG tablet Take 40 mg by mouth daily.      . Sildenafil Citrate (VIAGRA PO) Take by mouth as needed.      . Soft Lens Products (REWETTING DROPS) SOLN 1 drop by Does not apply route daily as needed (for dry eyes).  No current facility-administered medications for this visit.    Allergies:    Allergies  Allergen Reactions  . Penicillins Itching and Rash  . Pneumococcal Vaccines Swelling, Rash and Other (See Comments)    Low grade fever    Social History:  The patient  reports that he has never smoked. He does not have any smokeless tobacco history on file.   ROS:  Please see the history of present illness.   All other systems reviewed and negative.   OBJECTIVE: VS:  BP 96/62  Pulse 65  Ht 5\' 7"  (1.702 m)  Wt 147 lb 12.8 oz (67.042 kg)  BMI 23.14 kg/m2 Well nourished, well developed, in no acute distress, appears younger than stated age 70: normal Neck: JVD flat. Carotid bruit 2+ bilateral  Cardiac:  normal S1, S2;  RRR; no murmur Lungs:  clear to auscultation bilaterally, no wheezing, rhonchi or rales Abd: soft, nontender, no hepatomegaly Ext: Edema absent. Pulses 2+ bilateral Skin: warm and dry Neuro:  CNs 2-12 intact, no focal abnormalities noted  EKG:  Normal sinus rhythm with an isolated PVC.       Signed, Illene Labrador III, MD 01/04/2014 10:29 AM

## 2014-03-01 ENCOUNTER — Other Ambulatory Visit: Payer: Self-pay | Admitting: Gastroenterology

## 2014-03-02 ENCOUNTER — Encounter (HOSPITAL_COMMUNITY): Payer: Self-pay | Admitting: *Deleted

## 2014-03-02 ENCOUNTER — Ambulatory Visit (HOSPITAL_COMMUNITY)
Admission: RE | Admit: 2014-03-02 | Discharge: 2014-03-02 | Disposition: A | Discharge status: Home / Self Care | Payer: Medicare Other | Source: Ambulatory Visit | Attending: Gastroenterology | Admitting: Gastroenterology

## 2014-03-02 ENCOUNTER — Encounter (HOSPITAL_COMMUNITY)
Admission: RE | Disposition: A | Discharge status: Home / Self Care | Payer: Self-pay | Source: Ambulatory Visit | Attending: Gastroenterology

## 2014-03-02 DIAGNOSIS — K648 Other hemorrhoids: Secondary | ICD-10-CM | POA: Insufficient documentation

## 2014-03-02 DIAGNOSIS — Z79899 Other long term (current) drug therapy: Secondary | ICD-10-CM | POA: Insufficient documentation

## 2014-03-02 DIAGNOSIS — I10 Essential (primary) hypertension: Secondary | ICD-10-CM | POA: Insufficient documentation

## 2014-03-02 DIAGNOSIS — Z7982 Long term (current) use of aspirin: Secondary | ICD-10-CM | POA: Insufficient documentation

## 2014-03-02 DIAGNOSIS — Z9089 Acquired absence of other organs: Secondary | ICD-10-CM | POA: Insufficient documentation

## 2014-03-02 DIAGNOSIS — K219 Gastro-esophageal reflux disease without esophagitis: Secondary | ICD-10-CM | POA: Insufficient documentation

## 2014-03-02 DIAGNOSIS — E785 Hyperlipidemia, unspecified: Secondary | ICD-10-CM | POA: Insufficient documentation

## 2014-03-02 DIAGNOSIS — Z85828 Personal history of other malignant neoplasm of skin: Secondary | ICD-10-CM | POA: Insufficient documentation

## 2014-03-02 DIAGNOSIS — D126 Benign neoplasm of colon, unspecified: Secondary | ICD-10-CM

## 2014-03-02 HISTORY — PX: HOT HEMOSTASIS: SHX5433

## 2014-03-02 HISTORY — PX: COLONOSCOPY: SHX5424

## 2014-03-02 SURGERY — COLONOSCOPY
Anesthesia: Moderate Sedation

## 2014-03-02 MED ORDER — FENTANYL CITRATE 0.05 MG/ML IJ SOLN
INTRAMUSCULAR | Status: DC | PRN
  Administered 2014-03-02 (×3): 25 ug via INTRAVENOUS

## 2014-03-02 MED ORDER — MIDAZOLAM HCL 5 MG/5ML IJ SOLN
INTRAMUSCULAR | Status: DC | PRN
  Administered 2014-03-02 (×3): 2.5 mg via INTRAVENOUS

## 2014-03-02 MED ORDER — MIDAZOLAM HCL 10 MG/2ML IJ SOLN
INTRAMUSCULAR | Status: AC
  Filled 2014-03-02: qty 4

## 2014-03-02 MED ORDER — DIPHENHYDRAMINE HCL 50 MG/ML IJ SOLN
INTRAMUSCULAR | Status: AC
  Filled 2014-03-02: qty 1

## 2014-03-02 MED ORDER — FENTANYL CITRATE 0.05 MG/ML IJ SOLN
INTRAMUSCULAR | Status: AC
  Filled 2014-03-02: qty 4

## 2014-03-02 MED ORDER — SODIUM CHLORIDE 0.9 % IV SOLN: INTRAVENOUS | Status: DC

## 2014-03-02 NOTE — Discharge Instructions (Addendum)
Avoid Aspirin and Ibuprofen medicines for 2 weeks. Will call you when pathology results are complete.Colonoscopy, Care After Refer to this sheet in the next few weeks. These instructions provide you with information on caring for yourself after your procedure. Your health care provider may also give you more specific instructions. Your treatment has been planned according to current medical practices, but problems sometimes occur. Call your health care provider if you have any problems or questions after your procedure. WHAT TO EXPECT AFTER THE PROCEDURE  After your procedure, it is typical to have the following:  A small amount of blood in your stool.  Moderate amounts of gas and mild abdominal cramping or bloating. HOME CARE INSTRUCTIONS  Do not drive, operate machinery, or sign important documents for 24 hours.  You may shower and resume your regular physical activities, but move at a slower pace for the first 24 hours.  Take frequent rest periods for the first 24 hours.  Walk around or put a warm pack on your abdomen to help reduce abdominal cramping and bloating.  Drink enough fluids to keep your urine clear or pale yellow.  You may resume your normal diet as instructed by your health care provider. Avoid heavy or fried foods that are hard to digest.  Avoid drinking alcohol for 24 hours or as instructed by your health care provider.  Only take over-the-counter or prescription medicines as directed by your health care provider.  If a tissue sample (biopsy) was taken during your procedure:  Do not take aspirin or blood thinners for 7 days, or as instructed by your health care provider.  Do not drink alcohol for 7 days, or as instructed by your health care provider.  Eat soft foods for the first 24 hours. SEEK MEDICAL CARE IF: You have persistent spotting of blood in your stool 2 3 days after the procedure. SEEK IMMEDIATE MEDICAL CARE IF:  You have more than a small spotting of  blood in your stool.  You pass large blood clots in your stool.  Your abdomen is swollen (distended).  You have nausea or vomiting.  You have a fever.  You have increasing abdominal pain that is not relieved with medicine. Document Released: 07/03/2004 Document Revised: 09/09/2013 Document Reviewed: 07/27/2013 [facility] Patient Information 2014 [place].

## 2014-03-02 NOTE — Interval H&P Note (Signed)
History and Physical Interval Note:  03/02/2014 8:47 AM  Dale [name]  has presented today for surgery, with the diagnosis of hx. of polyps  The various methods of treatment have been discussed with the patient and family. After consideration of risks, benefits and other options for treatment, the patient has consented to  Procedure(s): COLONOSCOPY (N/A) HOT HEMOSTASIS (ARGON PLASMA COAGULATION/BICAP) (N/A) as a surgical intervention .  The patient's history has been reviewed, patient examined, no change in status, stable for surgery.  I have reviewed the patient's chart and labs.  Questions were answered to the patient's satisfaction.     [place] C.

## 2014-03-02 NOTE — H&P (Signed)
Date of Initial H&P: 02/23/14  History reviewed, patient examined, no change in status, stable for surgery.

## 2014-03-02 NOTE — Addendum Note (Signed)
Addended by: Nikolaus Pienta on: 03/02/2014 08:48 AM   Modules accepted: Orders  

## 2014-03-02 NOTE — Op Note (Signed)
[facility] [place], [address]   COLONOSCOPY PROCEDURE REPORT  PATIENT: Dale [name], Dale [name]  MR#: [id number] BIRTHDATE: [birth year] , 76  yrs. old GENDER: Male ENDOSCOPIST: Wilford Corner, MD REFERRED HE:RDEY Ross, M.D. PROCEDURE DATE:  03/02/2014 PROCEDURE:   Colonoscopy with snare polypectomy ASA CLASS:   Class II INDICATIONS:Patient's personal history of adenomatous colon polyps and follow up of colonic polyps. MEDICATIONS: Versed-Detailed 7.5 mg IV and Fentanyl 75 mcg IV  DESCRIPTION OF PROCEDURE:   After the risks benefits and alternatives of the procedure were thoroughly explained, informed consent was obtained.  The     endoscope was introduced through the anus and advanced to the cecum, which was identified by both the appendix and ileocecal valve , limited by No adverse events experienced.   The quality of the prep was good. .  The instrument was then slowly withdrawn as the colon was fully examined.     FINDINGS:  Rectal exam unremarkable.  Pediatric colonoscope inserted into the colon and advanced to the cecum, where the appendiceal orifice and ileocecal valve were identified. During insertion the previously tattooed area of the colon was seen at the splenic flexure where an 8 mm sessile polyp was noted likely representing residual polyp.    On careful withdrawal of the colonoscope this residual polyp was again noted and was removed with snare cautery. Retroflexion revealed small internal hemorrhoids.  COMPLICATIONS: None  IMPRESSION:     Colon polyp X 1 removed from splenic flexure Internal hemorrhoids  RECOMMENDATIONS: F/U on path; Avoid NSAIDs for 2 weeks    ______________________________ eSigned:  Wilford Corner, MD 03/02/2014 10:18 AM   CX:KGYJ Harrington Challenger, MD

## 2014-03-03 ENCOUNTER — Encounter (HOSPITAL_COMMUNITY): Payer: Self-pay | Admitting: Gastroenterology

## 2014-11-11 ENCOUNTER — Encounter (HOSPITAL_COMMUNITY): Payer: Self-pay | Admitting: Interventional Cardiology

## 2015-01-16 ENCOUNTER — Other Ambulatory Visit: Payer: Self-pay | Admitting: Interventional Cardiology

## 2015-03-03 ENCOUNTER — Ambulatory Visit (INDEPENDENT_AMBULATORY_CARE_PROVIDER_SITE_OTHER): Payer: Medicare Other | Admitting: Interventional Cardiology

## 2015-03-03 ENCOUNTER — Encounter: Payer: Self-pay | Admitting: Interventional Cardiology

## 2015-03-03 VITALS — BP 104/68 | HR 56 | Ht 66.0 in | Wt 158.1 lb

## 2015-03-03 DIAGNOSIS — I493 Ventricular premature depolarization: Secondary | ICD-10-CM | POA: Diagnosis not present

## 2015-03-03 DIAGNOSIS — I208 Other forms of angina pectoris: Secondary | ICD-10-CM | POA: Diagnosis not present

## 2015-03-03 DIAGNOSIS — E7800 Pure hypercholesterolemia, unspecified: Secondary | ICD-10-CM

## 2015-03-03 DIAGNOSIS — I10 Essential (primary) hypertension: Secondary | ICD-10-CM | POA: Diagnosis not present

## 2015-03-03 DIAGNOSIS — R9439 Abnormal result of other cardiovascular function study: Secondary | ICD-10-CM

## 2015-03-03 DIAGNOSIS — I209 Angina pectoris, unspecified: Secondary | ICD-10-CM | POA: Diagnosis not present

## 2015-03-03 DIAGNOSIS — R931 Abnormal findings on diagnostic imaging of heart and coronary circulation: Secondary | ICD-10-CM

## 2015-03-03 MED ORDER — METOPROLOL SUCCINATE ER 50 MG PO TB24: ORAL_TABLET | ORAL | Status: DC

## 2015-03-03 NOTE — Progress Notes (Signed)
Cardiology Office Note   Date:  03/03/2015   ID:  Dale [name], Dale [name] [birth year], MRN [id number]  PCP:   Melinda Crutch, MD  Cardiologist:   [name], MD   No chief complaint on file.     History of Present Illness: Dale [name] is a 69 y.o. male who presents for angina pectoris and microvascular coronary disease. He is a well with medication adjustment and had no angina. 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 He has had no limitations. No medication side effects. Lisinopril HCT dose was adjusted downward to accommodate significant lowering and the blood pressure that occur with beta blocker therapy.    Past Medical History  Diagnosis Date  . Exertional angina   . Abnormal nuclear stress test   . Hypercholesteremia   . GERD (gastroesophageal reflux disease)   . HTN (hypertension)   . ED (erectile dysfunction)   . Allergic rhinitis   . ED (erectile dysfunction)     Past Surgical History  Procedure Laterality Date  . Appendectomy    . Reconstruction of nose    . Knee surgery    . Basal cell removed from nose    . Colonoscopy N/A 03/02/2014    Procedure: COLONOSCOPY;  Surgeon: [name], MD;  Location: [facility] ENDOSCOPY;  Service: Endoscopy;  Laterality: N/A;  . Hot hemostasis N/A 03/02/2014    Procedure: HOT HEMOSTASIS (ARGON PLASMA COAGULATION/BICAP);  Surgeon: [name], MD;  Location: [facility];  Service: Endoscopy;  Laterality: N/A;  . Left heart catheterization with coronary angiogram N/A 05/01/2013    Procedure: LEFT HEART CATHETERIZATION WITH CORONARY ANGIOGRAM;  Surgeon: [name], MD;  Location: [facility] CATH LAB;  Service: Cardiovascular;  Laterality: N/A;     Current Outpatient Prescriptions  Medication Sig Dispense Refill  . aspirin EC 81 MG tablet Take 81 mg by mouth daily.    Marland Kitchen atorvastatin (LIPITOR) 10 MG tablet Take 10 mg by mouth at bedtime.    . Calcium Carbonate Antacid  (TUMS E-X PO) Take 1,000 mg by mouth daily as needed (for stomach heartburn).    . diclofenac sodium (VOLTAREN) 1 % GEL Apply 2 g topically daily as needed (for muscle pains).    . fluticasone (FLONASE) 50 MCG/ACT nasal spray Place 1 spray into the nose daily as needed for rhinitis or allergies.    Marland Kitchen lisinopril (PRINIVIL,ZESTRIL) 10 MG tablet Take 5 mg by mouth daily.    . metoprolol succinate (TOPROL-XL) 50 MG 24 hr tablet TAKE 1 TABLET (50 MG TOTAL) BY MOUTH DAILY. TAKE WITH OR IMMEDIATELY FOLLOWING A MEAL. 90 tablet 3  . nitroGLYCERIN (NITROSTAT) 0.4 MG SL tablet Place 0.4 mg under the tongue every 5 (five) minutes as needed for chest pain.    . pantoprazole (PROTONIX) 40 MG tablet Take 40 mg by mouth daily.    . Sildenafil Citrate (VIAGRA PO) Take by mouth as needed.    . Soft Lens Products (REWETTING DROPS) SOLN 1 drop by Does not apply route daily as needed (for dry eyes).    Marland Kitchen tiZANidine (ZANAFLEX) 2 MG tablet Take 2 mg by mouth daily as needed. Every 8 hours daily by mouth as needed muscle spasms.  1   No current facility-administered medications for this visit.    Allergies:   Penicillins and Pneumococcal vaccines    Social History:  The patient  reports that he has never smoked. He does not  have any smokeless tobacco history on file. He reports that he does not drink alcohol.   Family History:  The patient's family history includes Clotting disorder in his brother; Heart disease in his father; Hypertension in his mother; Stroke in his mother.    ROS:  Please see the history of present illness.   Otherwise, review of systems are positive for elevated PSA.   All other systems are reviewed and negative.    PHYSICAL EXAM: VS:  BP 104/68 mmHg  Pulse 56  Ht 5\' 6"  (1.676 m)  Wt 158 lb 1.9 oz (71.723 kg)  BMI 25.53 kg/m2 , BMI Body mass index is 25.53 kg/(m^2). GEN: Well nourished, well developed, in no acute distress HEENT: normal Neck: no JVD, carotid bruits, or masses Cardiac:  RRR; no murmurs, rubs, or gallops,no edema  Respiratory:  clear to auscultation bilaterally, normal work of breathing GI: soft, nontender, nondistended, + BS MS: no deformity or atrophy Skin: warm and dry, no rash Neuro:  Strength and sensation are intact Psych: euthymic mood, full affect   EKG:  EKG is ordered today. The ekg ordered today demonstrates sinus bradycardia 56 bpm, and otherwise normal.   Recent Labs: No results found for requested labs within last 365 days.    Lipid Panel No results found for: CHOL, TRIG, HDL, CHOLHDL, VLDL, LDLCALC, LDLDIRECT    Wt Readings from Last 3 Encounters:  03/03/15 158 lb 1.9 oz (71.723 kg)  03/02/14 145 lb (65.772 kg)  01/04/14 147 lb 12.8 oz (67.042 kg)      Other studies Reviewed: Additional studies/ records that were reviewed today include: .    ASSESSMENT AND PLAN:  Exertional angina: Resolved  Abnormal nuclear stress test  Essential hypertension: Excellent control Premature ventricular contractions  Hypercholesteremia: LDL 85 on recent evaluation. Total cholesterol 150      Current medicines are reviewed at length with the patient today.  The patient does not have concerns regarding medicines.  The following changes have been made:  None  Labs/ tests ordered today include:   Orders Placed This Encounter  Procedures  . EKG 12-Lead     Disposition:   FU with Linard Millers 1 year    Signed, [name], MD  03/03/2015 9:00 AM    [place], [place], [place]  [address] Phone: [phone]; Fax: [phone]

## 2015-03-03 NOTE — Patient Instructions (Signed)
Your physician recommends that you continue on your current medications as directed. Please refer to the Current Medication list given to you today.  Your physician wants you to follow-up in: 1 year with Dr.Smith You will receive a reminder letter in the mail two months in advance. If you don't receive a letter, please call our office to schedule the follow-up appointment.  

## 2015-09-07 ENCOUNTER — Other Ambulatory Visit: Payer: Self-pay | Admitting: Adult Health

## 2016-04-22 ENCOUNTER — Other Ambulatory Visit: Payer: Self-pay | Admitting: Interventional Cardiology

## 2016-05-21 ENCOUNTER — Other Ambulatory Visit: Payer: Self-pay | Admitting: Interventional Cardiology

## 2016-05-29 ENCOUNTER — Telehealth: Payer: Self-pay | Admitting: Interventional Cardiology

## 2016-05-29 NOTE — Telephone Encounter (Addendum)
Pt has appt with Dr.Smith 07-24-16-paper referral came in from [place] to see Smith-pt calling wanting sooner appt with Tamala Julian- pls advise -will be out of town 7-8 thru 7-15

## 2016-05-30 NOTE — Telephone Encounter (Signed)
Returned pt call. Pt aware appt with Dr.Smith moved up to 7/31 @ 12pm. Pt verbalized understanding and voiced appreciation

## 2016-06-21 ENCOUNTER — Encounter: Payer: Self-pay | Admitting: *Deleted

## 2016-06-21 ENCOUNTER — Other Ambulatory Visit: Payer: Self-pay | Admitting: Gastroenterology

## 2016-06-21 DIAGNOSIS — R1084 Generalized abdominal pain: Secondary | ICD-10-CM

## 2016-06-26 ENCOUNTER — Ambulatory Visit
Admission: RE | Admit: 2016-06-26 | Discharge: 2016-06-26 | Disposition: A | Discharge status: Home / Self Care | Payer: Medicare Other | Source: Ambulatory Visit | Attending: Gastroenterology | Admitting: Gastroenterology

## 2016-06-26 DIAGNOSIS — R1084 Generalized abdominal pain: Secondary | ICD-10-CM

## 2016-06-26 IMAGING — CT CT ABDOMEN W/ CM
3 of 5 series · 13 of 36 positions shown, 19 images · IV contrast (READICAT/WATER & [ID] ISOVUE 300)
Comparison: None.

CLINICAL DATA: RIGHT-sided pain. Pain following eating. Pain began
[DATE]. Bilateral inguinal hernia repair. Prior appendectomy.

EXAM:
CT ABDOMEN WITH CONTRAST
TECHNIQUE: Multidetector CT imaging of the abdomen was performed using the
standard protocol following bolus administration of intravenous
contrast.
CONTRAST:  100mL [FG] IOPAMIDOL ([FG]) INJECTION 61%

[Series 3: abd/pelvis with · axial · 0.77mm/px · z∈[-188,+32]mm · 6 of 62 slices shown, 11 images]
[im 9/62  soft-tissue]
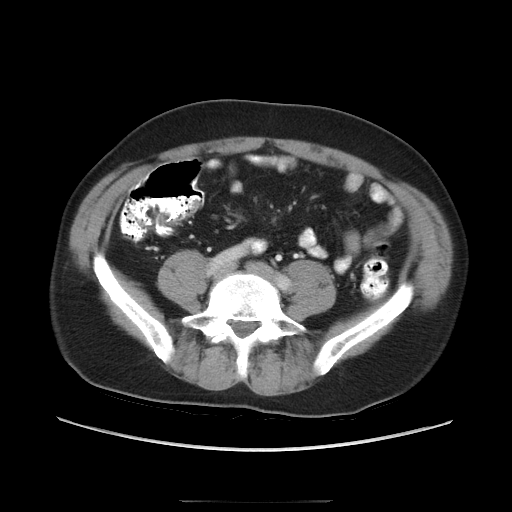
[im 9/62  bone]
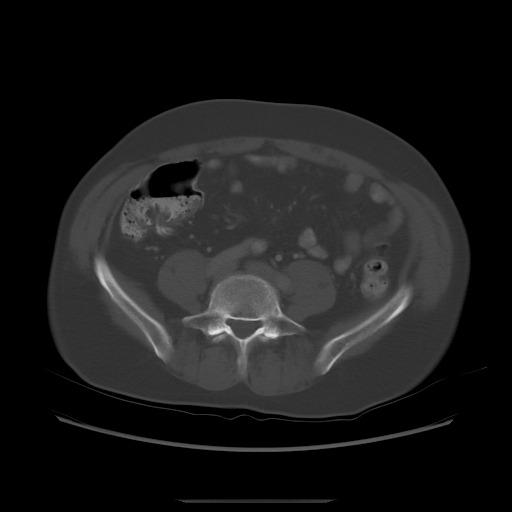
[im 18/62  soft-tissue]
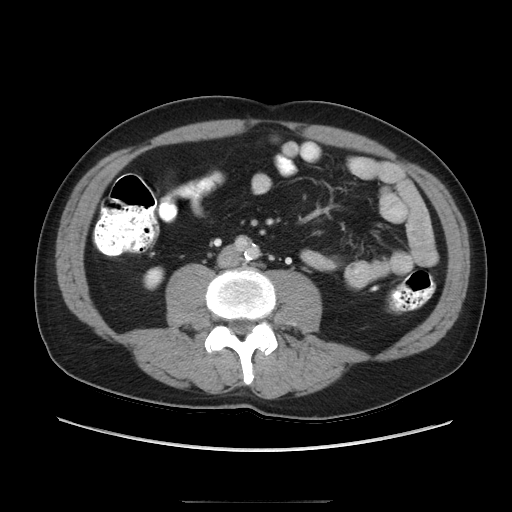
[im 27/62  soft-tissue]
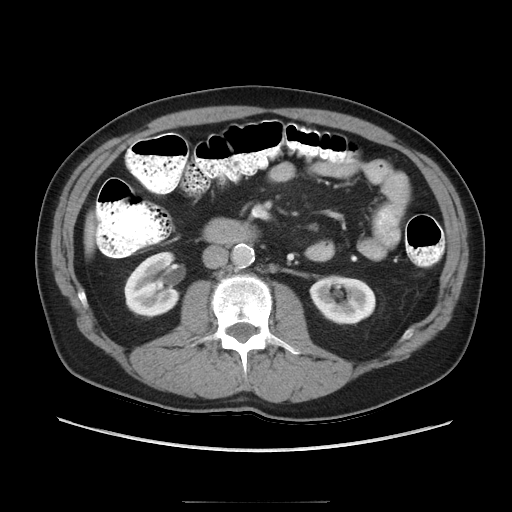
[im 27/62  lung]
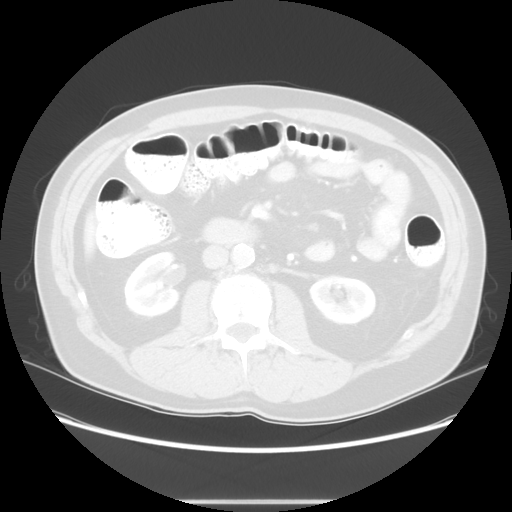
[im 35/62  soft-tissue]
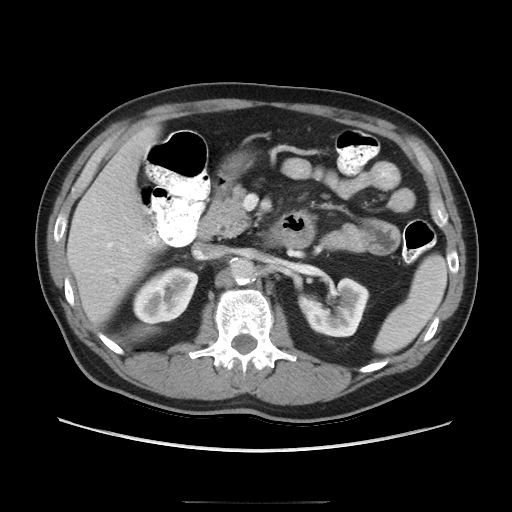
[im 35/62  lung]
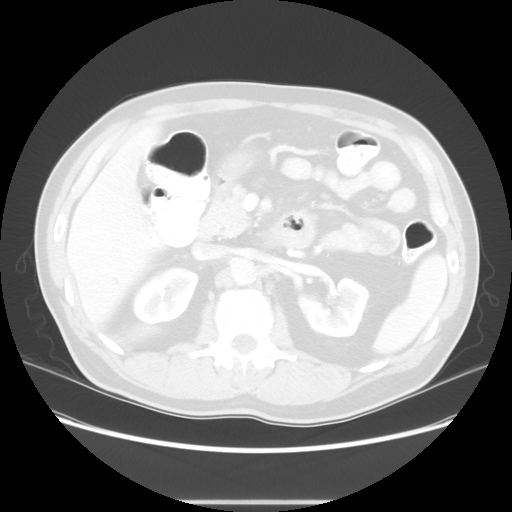
[im 44/62  soft-tissue]
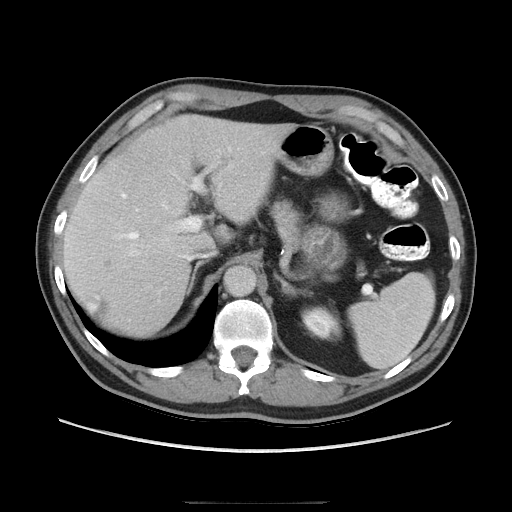
[im 44/62  lung]
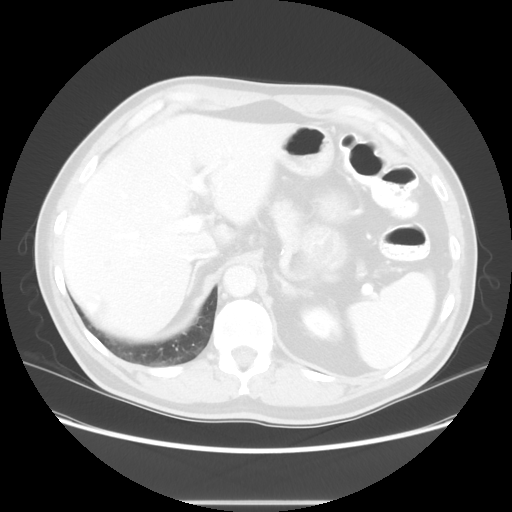
[im 53/62  soft-tissue]
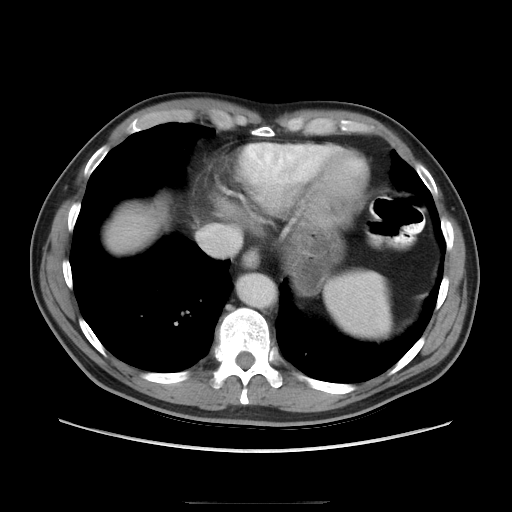
[im 53/62  lung]
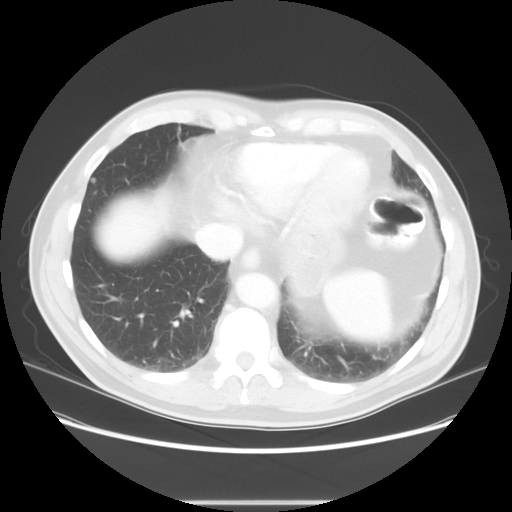

[Series 601: coronal body · coronal · 0.77mm/px · 1 of 111 slices shown, 2 images]
[im 37/111  soft-tissue]
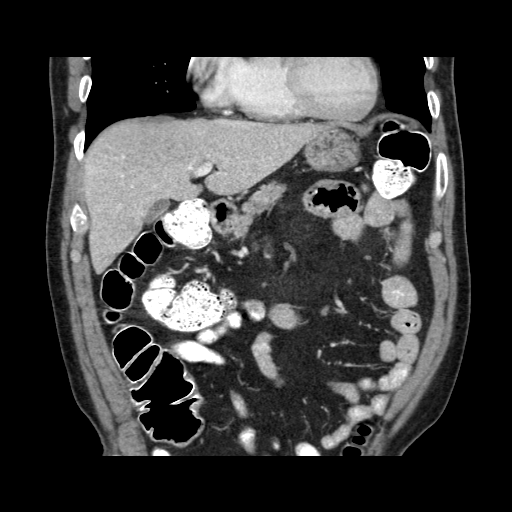
[im 37/111  bone]
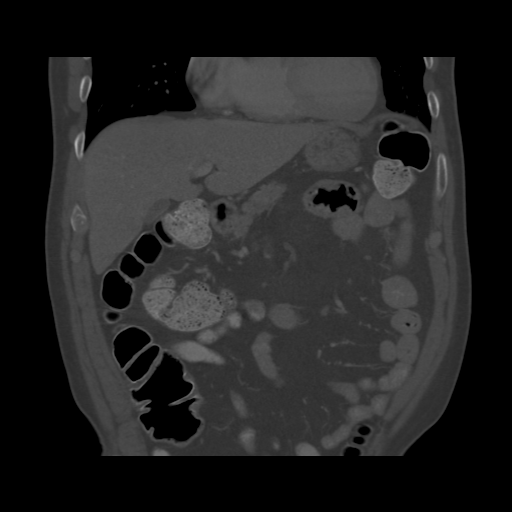

[Series 602: sagittal body · sagittal · 0.77mm/px · 6 of 145 slices shown]
[im 10/145  soft-tissue]
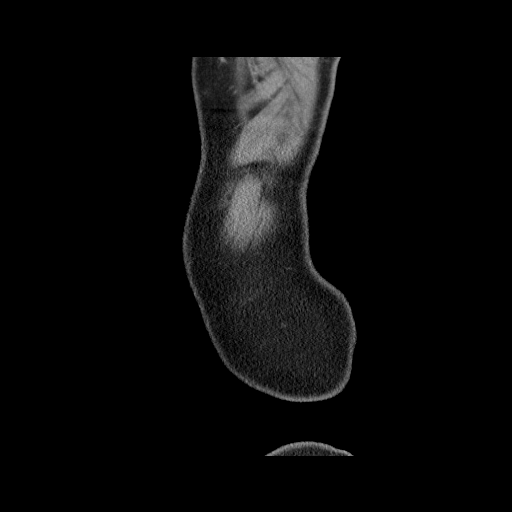
[im 28/145  soft-tissue]
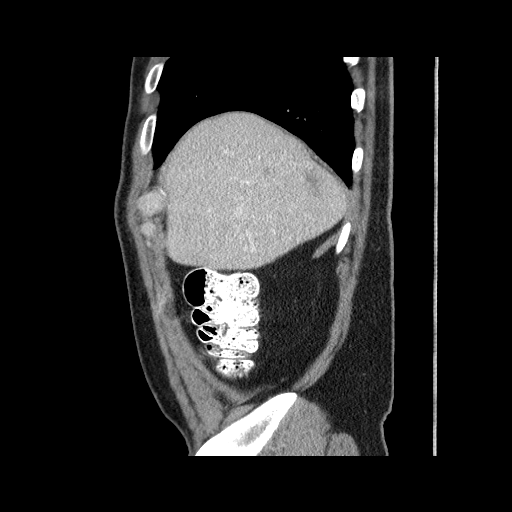
[im 46/145  soft-tissue]
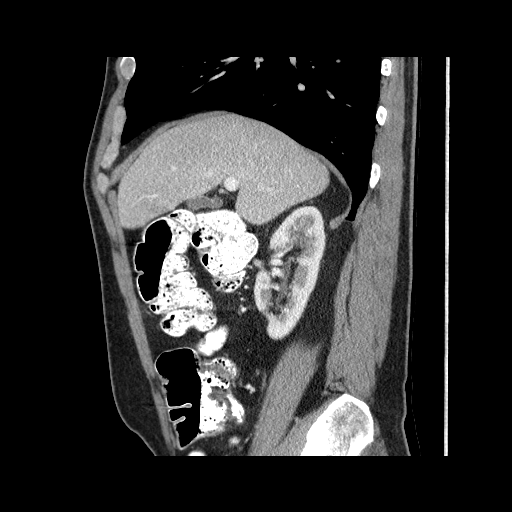
[im 64/145  soft-tissue]
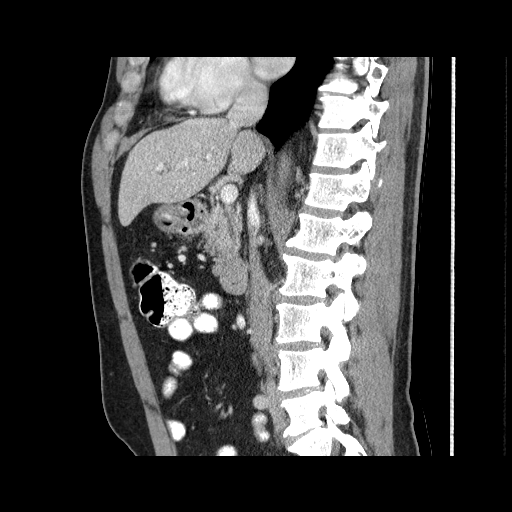
[im 82/145  soft-tissue]
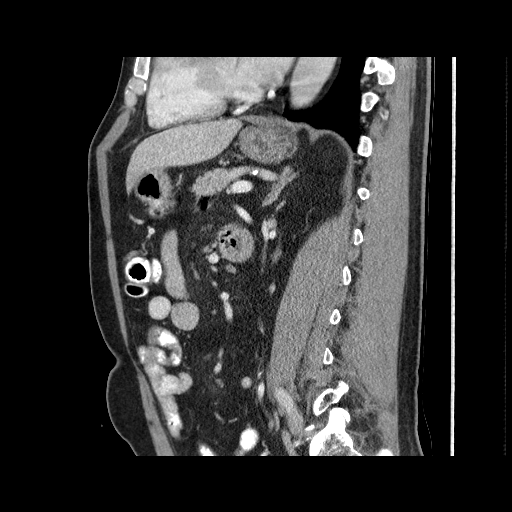
[im 100/145  soft-tissue]
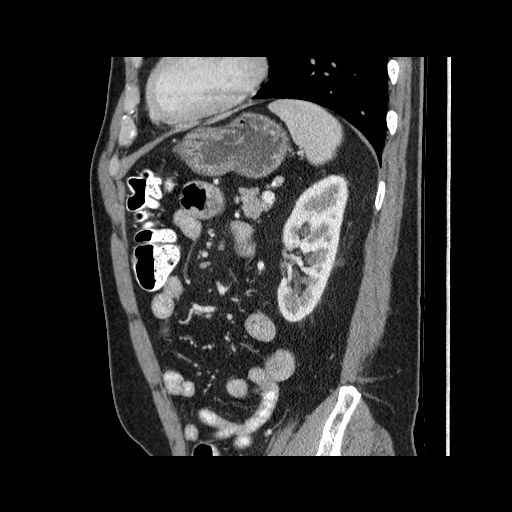

[13 of 36 positions shown; findings below may reference images not displayed]

FINDINGS: Lower chest:  Lung bases are clear.

Hepatobiliary: Peripherally enhancing lesion in the subcapsular
RIGHT hepatic lobe measuring 2.2 cm on image 20, series 3 has
imaging characteristics most suggestive of a benign hemangioma.

No biliary duct dilatation. Gallbladder is normal and collapsed.
Common bile duct normal caliber.

Pancreas: Normal pancreatic parenchymal intensity. No ductal
dilatation or inflammation.

Spleen: Normal spleen.

Adrenals/urinary tract: Adrenal glands, kidneys and upper ureters
normal.

Stomach/Bowel: Stomach and limited of the small bowel is
unremarkable

Vascular/Lymphatic: Abdominal aortic normal caliber. No
retroperitoneal periportal lymphadenopathy.

Musculoskeletal: No aggressive osseous lesion
IMPRESSION: 1. Normal gallbladder.
2. No biliary duct dilatation.
3. No pancreatic inflammation.
4. Enhancing lesions in subcapsular RIGHT hepatic lobe is most
suggestive of a benign hemangioma. Finding could be confirmed with
contrast abdominal MRI.

## 2016-06-26 MED ORDER — IOPAMIDOL (ISOVUE-300) INJECTION 61%
100.0000 mL | Freq: Once | INTRAVENOUS | Status: AC | PRN
  Administered 2016-06-26: 100 mL via INTRAVENOUS

## 2016-07-02 ENCOUNTER — Encounter: Payer: Self-pay | Admitting: Interventional Cardiology

## 2016-07-02 ENCOUNTER — Ambulatory Visit (INDEPENDENT_AMBULATORY_CARE_PROVIDER_SITE_OTHER): Payer: Medicare Other | Admitting: Interventional Cardiology

## 2016-07-02 VITALS — BP 126/68 | HR 56 | Ht 66.0 in | Wt 158.0 lb

## 2016-07-02 DIAGNOSIS — R931 Abnormal findings on diagnostic imaging of heart and coronary circulation: Secondary | ICD-10-CM | POA: Diagnosis not present

## 2016-07-02 DIAGNOSIS — I208 Other forms of angina pectoris: Secondary | ICD-10-CM

## 2016-07-02 DIAGNOSIS — R9439 Abnormal result of other cardiovascular function study: Secondary | ICD-10-CM

## 2016-07-02 DIAGNOSIS — I493 Ventricular premature depolarization: Secondary | ICD-10-CM

## 2016-07-02 DIAGNOSIS — I10 Essential (primary) hypertension: Secondary | ICD-10-CM

## 2016-07-02 DIAGNOSIS — E7800 Pure hypercholesterolemia, unspecified: Secondary | ICD-10-CM | POA: Diagnosis not present

## 2016-07-02 NOTE — Progress Notes (Signed)
Cardiology Office Note    Date:  07/02/2016   ID:  Dale [name] 10-27-1946, MRN [id number]  PCP:   Melinda Crutch, MD  Cardiologist: [name], MD   Chief Complaint  Patient presents with  . Coronary Artery Disease    History of Present Illness:  Dale [name] is a 70 y.o. 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  Dale [name] has been having gastrointestinal/right upper quadrant discomfort that occurs sporadically. He is undergoing a workup to discover the etiology. He will have further GI testing done this coming week at [facility]. He denies exertion-related chest discomfort. No significant dyspnea. 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  One episode of chest tightness occurring at rest in June that was in the epigastric area radiating up into the midsternal region. This is not recurred. He was seen by Dr. Zadie Rhine in June when this episode occurred and had a normal EKG with the exception of sinus bradycardia which is chronic.  Past Medical History:  Diagnosis Date  . Abnormal nuclear stress test   . Allergic rhinitis   . Arthritis    episodic  . ED (erectile dysfunction)   . Exertional angina (HCC)   . GERD (gastroesophageal reflux disease)   . H/O colonoscopy with polypectomy    tubular adenomas and hyperplastic polyps  . HTN (hypertension)   . Hypercholesteremia   . Seasonal allergies   . Skin cancer, basal cell     Past Surgical History:  Procedure Laterality Date  . APPENDECTOMY    . basal cell removed from nose    . COLONOSCOPY N/A 03/02/2014   Procedure: COLONOSCOPY;  Surgeon: [name], MD;  Location: [facility] ENDOSCOPY;  Service: Endoscopy;  Laterality: N/A;    . HOT HEMOSTASIS N/A 03/02/2014   Procedure: HOT HEMOSTASIS (ARGON PLASMA COAGULATION/BICAP);  Surgeon: [name], MD;  Location: [facility];  Service: Endoscopy;  Laterality: N/A;  . INGUINAL HERNIA REPAIR Bilateral   . KNEE ARTHROSCOPY Bilateral   . KNEE SURGERY    . LEFT HEART CATHETERIZATION WITH CORONARY ANGIOGRAM N/A 05/01/2013   Procedure: LEFT HEART CATHETERIZATION WITH CORONARY ANGIOGRAM;  Surgeon: [name], MD;  Location[facility] CATH LAB;  Service: Cardiovascular;  Laterality: N/A;  . RECONSTRUCTION OF NOSE      Current Medications: Outpatient Medications Prior to Visit  Medication Sig Dispense Refill  . aspirin EC 81 MG tablet Take 81 mg by mouth daily.    Marland Kitchen atorvastatin (LIPITOR) 10 MG tablet Take 10 mg by mouth at bedtime.    . Calcium Carbonate Antacid (TUMS E-X PO) Take 1,000 mg by mouth daily as needed (for stomach heartburn).    . diclofenac sodium (VOLTAREN) 1 % GEL Apply 2 g topically daily as needed (for muscle pains).    . fluticasone (FLONASE) 50 MCG/ACT nasal spray Place 1 spray into the nose daily as needed for rhinitis or allergies.    Marland Kitchen lisinopril (PRINIVIL,ZESTRIL) 10 MG tablet Take 5 mg by mouth daily.    . metoprolol succinate (TOPROL-XL) 50 MG 24 hr tablet Take 1 tablet (50 mg total) by mouth daily. 30 tablet 1  . nitroGLYCERIN (NITROSTAT) 0.4 MG SL tablet Place 0.4 mg under the tongue every 5 (five)  minutes as needed for chest pain.    . pantoprazole (PROTONIX) 40 MG tablet Take 40 mg by mouth daily.    . Sildenafil Citrate (VIAGRA PO) Take by mouth as needed.    . Soft Lens Products (REWETTING DROPS) SOLN 1 drop by Does not apply route daily as needed (for dry eyes).    Marland Kitchen tiZANidine (ZANAFLEX) 2 MG tablet Take 2 mg by mouth daily as needed. Every 8 hours daily by mouth as needed muscle spasms.  1   No facility-administered medications prior to visit.      Allergies:   Penicillins and Pneumococcal vaccines   Social History   Social  History  . Marital status: Married    Spouse name: N/A  . Number of children: 2  . Years of education: N/A   Social History Main Topics  . Smoking status: Never Smoker  . Smokeless tobacco: None  . Alcohol use No  . Drug use: Unknown  . Sexual activity: Not Asked   Other Topics Concern  . None   Social History Narrative  . None     Family History:  The patient's family history includes Clotting disorder in his brother; Heart disease in his father; Hypertension in his mother; Stroke in his mother.   ROS:   Please see the history of present illness.    Recurring abdominal pain. Low back discomfort that limits heavy physical activity such as running/spraining. Constipation.  All other systems reviewed and are negative.   PHYSICAL EXAM:   VS:  BP 126/68   Pulse (!) 56   Ht 5\' 6"  (1.676 m)   Wt 158 lb (71.7 kg)   BMI 25.50 kg/m    GEN: Well nourished, well developed, in no acute distress  HEENT: normal  Neck: no JVD, carotid bruits, or masses Cardiac: RRR; no murmurs, rubs, or gallops,no edema  Respiratory:  clear to auscultation bilaterally, normal work of breathing GI: soft, nontender, nondistended, + BS MS: no deformity or atrophy  Skin: warm and dry, no rash Neuro:  Alert and Oriented x 3, Strength and sensation are intact Psych: euthymic mood, full affect  Wt Readings from Last 3 Encounters:  07/02/16 158 lb (71.7 kg)  03/03/15 158 lb 1.9 oz (71.7 kg)  03/02/14 145 lb (65.8 kg)      Studies/Labs Reviewed:   EKG:  EKG  Performed at [facility] on 05/24/2016 reveals sinus bradycardia at 49 bpm but otherwise normal. No changes noted when compared to prior tracings.  Recent Labs: No results found for requested labs within last 8760 hours.   Lipid Panel No results found for: CHOL, TRIG, HDL, CHOLHDL, VLDL, LDLCALC, LDLDIRECT  Additional studies/ records that were reviewed today include:  No new data     ASSESSMENT:    1. Essential hypertension   2.  Abnormal nuclear stress test   3. Hypercholesteremia   4. Premature ventricular contractions   5. Exertional angina (HCC)      PLAN:  In order of problems listed above:  1. Low salt diet and continue to monitor. Target is 130/90 mmHg. 2. No further testing required 3. Followed by primary care. LDL target should be at least 100 and preferably close to 70 4. Asymptomatic 5. Asymptomatic at this time. No limitations to physical activity other than as produced by exertion related symptoms of angina.    Medication Adjustments/Labs and Tests Ordered: Current medicines are reviewed at length with the patient today.  Concerns regarding medicines are outlined above.  Medication changes, Labs and Tests ordered today are listed in the Patient Instructions below. There are no Patient Instructions on file for this visit.   Signed, [name], MD  07/02/2016 12:58 PM    [facility] [place], [place], [place]  [address] Phone: [phone]; Fax: [phone]

## 2016-07-02 NOTE — Patient Instructions (Signed)

## 2016-07-06 ENCOUNTER — Other Ambulatory Visit: Payer: Self-pay | Admitting: Gastroenterology

## 2016-07-11 ENCOUNTER — Other Ambulatory Visit (HOSPITAL_COMMUNITY): Payer: Self-pay | Admitting: Gastroenterology

## 2016-07-11 DIAGNOSIS — R1084 Generalized abdominal pain: Secondary | ICD-10-CM

## 2016-07-16 ENCOUNTER — Ambulatory Visit (HOSPITAL_COMMUNITY)
Admission: RE | Admit: 2016-07-16 | Discharge: 2016-07-16 | Disposition: A | Discharge status: Home / Self Care | Payer: Medicare Other | Source: Ambulatory Visit | Attending: Gastroenterology | Admitting: Gastroenterology

## 2016-07-16 ENCOUNTER — Other Ambulatory Visit: Payer: Self-pay | Admitting: Interventional Cardiology

## 2016-07-16 DIAGNOSIS — R1084 Generalized abdominal pain: Secondary | ICD-10-CM | POA: Diagnosis present

## 2016-07-16 IMAGING — NM NM HEPATO W/GB/PHARM/[PERSON_NAME]
2 series · 12 of 12 positions shown · non-contrast
Comparison: CT scan of the abdomen pelvis of [DATE] and
abdominal ultrasound [DATE].

CLINICAL DATA: Four months of right upper quadrant pain especially
with fatty foods

EXAM:
NUCLEAR MEDICINE HEPATOBILIARY IMAGING WITH GALLBLADDER EF
TECHNIQUE: Sequential images of the abdomen were obtained [DATE] minutes
following intravenous administration of radiopharmaceutical. After
oral ingestion of Ensure, gallbladder ejection fraction was
determined. At 60 min, normal ejection fraction is greater than 33%.
RADIOPHARMACEUTICALS:  5.2 mCi [FO]  Choletec IV

[he hepatobiliary · 3.43mm/px · 6 of 57 frames shown (1 of 2)]
[frame 5/57]
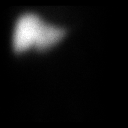
[frame 15/57]
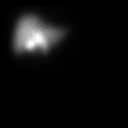
[frame 24/57]
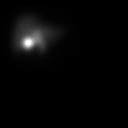
[frame 34/57]
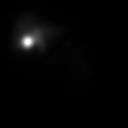
[frame 43/57]
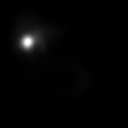
[frame 53/57]
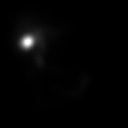

[he hepatobiliary · 3.43mm/px · 6 of 60 frames shown (2 of 2)]
[frame 6/60]
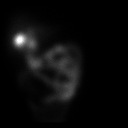
[frame 16/60]
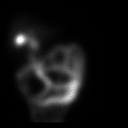
[frame 26/60]
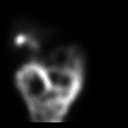
[frame 36/60]
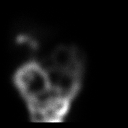
[frame 46/60]
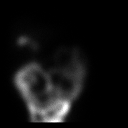
[frame 56/60]
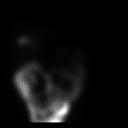

[12 of 12 positions shown; findings below may reference images not displayed]

FINDINGS: Prompt uptake and biliary excretion of activity by the liver is
seen. Gallbladder activity is visualized, consistent with patency of
cystic duct. Biliary activity passes into small bowel, consistent
with patent common bile duct.

Calculated gallbladder ejection fraction is 93%. (Normal gallbladder
ejection fraction with Ensure is greater than 33%.)
IMPRESSION: Normal hepatobiliary scan with normal gallbladder ejection fraction.

## 2016-07-16 MED ORDER — TECHNETIUM TC 99M MEBROFENIN IV KIT: 5.0000 | PACK | Freq: Once | INTRAVENOUS | Status: DC | PRN

## 2016-07-24 ENCOUNTER — Ambulatory Visit: Payer: Medicare Other | Admitting: Interventional Cardiology

## 2017-02-20 ENCOUNTER — Ambulatory Visit (INDEPENDENT_AMBULATORY_CARE_PROVIDER_SITE_OTHER): Payer: Medicare Other

## 2017-02-20 ENCOUNTER — Encounter (INDEPENDENT_AMBULATORY_CARE_PROVIDER_SITE_OTHER): Payer: Self-pay | Admitting: Orthopedic Surgery

## 2017-02-20 ENCOUNTER — Ambulatory Visit (INDEPENDENT_AMBULATORY_CARE_PROVIDER_SITE_OTHER): Payer: Medicare Other | Admitting: Orthopedic Surgery

## 2017-02-20 VITALS — BP 162/84 | HR 64 | Resp 13 | Ht 68.0 in | Wt 168.0 lb

## 2017-02-20 DIAGNOSIS — M1712 Unilateral primary osteoarthritis, left knee: Secondary | ICD-10-CM

## 2017-02-20 DIAGNOSIS — M25562 Pain in left knee: Secondary | ICD-10-CM

## 2017-02-20 MED ORDER — LIDOCAINE HCL 1 % IJ SOLN
5.0000 mL | INTRAMUSCULAR | Status: AC | PRN
  Administered 2017-02-20: 5 mL

## 2017-02-20 MED ORDER — BUPIVACAINE HCL 0.5 % IJ SOLN
3.0000 mL | INTRAMUSCULAR | Status: AC | PRN
  Administered 2017-02-20: 3 mL via INTRA_ARTICULAR

## 2017-02-20 MED ORDER — METHYLPREDNISOLONE ACETATE 40 MG/ML IJ SUSP
80.0000 mg | INTRAMUSCULAR | Status: AC | PRN
  Administered 2017-02-20: 80 mg

## 2017-02-20 NOTE — Progress Notes (Signed)
Office Visit Note   Patient: Dale [name]           Date of Birth: [birth year]           MRN: [id number] Visit Date: 02/20/2017              Requested by: [name], MD [place], [place] [address] PCP: Melinda Crutch, MD   Assessment & Plan: Visit Diagnoses:  1. Unilateral primary osteoarthritis, left knee   2. Acute pain of left knee     Plan: #1: Aspiration and injection of the left knee was performed #2: Shown bracing that may be of benefit for him. He did not take any at this time. #3: Fluid was sent for analysis #4: Limit his activity for the next 24-48 hours. #5: He may give [place] a call. Would like to consider Visco supplementation and we can precertified. #6: Certainly his exam as well as radiographic studies would tend to lean towards total knee replacement.  Follow-Up Instructions: Return if symptoms worsen or fail to improve.   Orders:  Orders Placed This Encounter  Procedures  . Large Joint Injection/Arthrocentesis  . Large Joint Injection/Arthrocentesis  . Body Fluid Culture  . XR KNEE 3 VIEW LEFT  . Synovial cell count + diff, w/ crystals   No orders of the defined types were placed in this encounter.     Procedures: Large Joint Inj Date/Time: 02/20/2017 5:43 PM Performed by: Biagio Borg D Authorized by: Biagio Borg D   Consent Given by:  Patient Timeout: prior to procedure the correct patient, procedure, and site was verified   Indications:  Pain and joint swelling Location:  Knee Site:  L knee Prep: patient was prepped and draped in usual sterile fashion   Needle Size:  25 G Needle Length:  1.5 inches Approach:  Anteromedial Ultrasound Guidance: No   Fluoroscopic Guidance: No   Arthrogram: No   Medications:  5 mL lidocaine 1 %; 80 mg methylPREDNISolone acetate 40 MG/ML; 3 mL bupivacaine 0.5 % Aspiration Attempted: Yes   Aspirate amount (mL):  75 Aspirate characteristics: The aspirate was to carefully bloody  but it was red tinged. Patient tolerance:  Patient tolerated the procedure well with no immediate complications     Clinical Data: No additional findings.   Subjective: Chief Complaint  Patient presents with  . Left Knee - Pain    Dale [name] is seen today for evaluation of his left knee. He was apparently playing pickle ball he thinks he may have hurt knee. He developed pain and discomfort in the knee and also some swelling. He iced it and used Voltaren. He also tried meloxicam and some benefit also. He complains of difficulty walking and is noting popping, clicking, and grinding noises. He feels that the knee is warm to touch. He did have a major knee surgery in 1972 that a probably medial meniscectomy open procedure he has also had 3 arthroscopic debridements to this knee by Dr. Durward Fortes. Seen today for evaluation.      Review of Systems  Constitutional: Negative.   HENT: Negative.   Respiratory: Negative.   Cardiovascular:       History of hypertension  Gastrointestinal: Negative.   Genitourinary: Negative.   Skin: Negative.   Neurological: Negative.   Hematological: Negative.   Psychiatric/Behavioral: Negative.      Objective: Vital Signs: BP (!) 162/84 (BP Location: Right Arm, Patient Position: Sitting, Cuff Size: Normal)   Pulse 64  Resp 13   Ht 5\' 8"  (1.727 m)   Wt 168 lb (76.2 kg)   BMI 25.54 kg/m   Physical Exam  Constitutional: He is oriented to person, place, and time. He appears well-developed and well-nourished.  HENT:  Head: Normocephalic and atraumatic.  Eyes: EOM are normal. Pupils are equal, round, and reactive to light.  Pulmonary/Chest: Effort normal.  Musculoskeletal:       Left knee: He exhibits effusion (2+).  Neurological: He is alert and oriented to person, place, and time.  Skin: Skin is warm and dry.  Psychiatric: He has a normal mood and affect. His behavior is normal. Judgment and thought content normal.    Left Knee Exam     Tenderness  The patient is experiencing tenderness in the medial joint line.  Range of Motion  Left knee extension: 5.  Left knee flexion: 95.   Tests  McMurray:  Medial - positive   Other  Scars: present Sensation: normal Pulse: present Effusion: effusion (2+) present  Comments:  Marked patellofemoral and tibiofemoral crepitus with range of motion. Pseudo-laxity with valgus stressing with an endpoint. Dopplerable mass posterior medial aspect consistent with the radiographic findings of calcification posteriorly.      Specialty Comments:  No specialty comments available.  Imaging: Xr Knee 3 View Left  Result Date: 02/20/2017 Three-view x-rays left knee reveals degenerative changes tricompartmental of the left knee. He has multiple loose bodies in calcification which appears to be in the medial and posterior aspect of his knee. He does have some translation of the femur medially on the tibial plateau. Periarticular bone spurs are prominent more on the medial joint space but also on the lateral. He also has cystic changes in the posterior femoral condyle. Bone-on-bone on the lateral standing film. Side degenerative changes in the patellofemoral joint with periarticular spurring.    PMFS History: Patient Active Problem List   Diagnosis Date Noted  . Unilateral primary osteoarthritis, left knee 02/20/2017  . Benign neoplasm of colon 03/02/2014  . Premature ventricular contractions 01/04/2014  . Hypercholesteremia   . GERD (gastroesophageal reflux disease)   . HTN (hypertension)   . ED (erectile dysfunction)   . Allergic rhinitis   . Exertional angina (HCC) 05/01/2013    Class: Acute  . Abnormal nuclear stress test 05/01/2013    Class: Acute   Past Medical History:  Diagnosis Date  . Abnormal nuclear stress test   . Allergic rhinitis   . Arthritis    episodic  . ED (erectile dysfunction)   . Exertional angina (HCC)   . GERD (gastroesophageal reflux disease)   .  H/O colonoscopy with polypectomy    tubular adenomas and hyperplastic polyps  . HTN (hypertension)   . Hypercholesteremia   . Seasonal allergies   . Skin cancer, basal cell     Family History  Problem Relation Age of Onset  . Heart disease Father   . Hypertension Mother   . Stroke Mother     Past Surgical History:  Procedure Laterality Date  . APPENDECTOMY    . basal cell removed from nose    . COLONOSCOPY N/A 03/02/2014   Procedure: COLONOSCOPY;  Surgeon: [name], MD;  Location: [facility];  Service: Endoscopy;  Laterality: N/A;  . HOT HEMOSTASIS N/A 03/02/2014   Procedure: HOT HEMOSTASIS (ARGON PLASMA COAGULATION/BICAP);  Surgeon: [name], MD;  Location: [facility];  Service: Endoscopy;  Laterality: N/A;  . INGUINAL HERNIA REPAIR Bilateral   . KNEE ARTHROSCOPY  Bilateral   . KNEE SURGERY    . LEFT HEART CATHETERIZATION WITH CORONARY ANGIOGRAM N/A 05/01/2013   Procedure: LEFT HEART CATHETERIZATION WITH CORONARY ANGIOGRAM;  Surgeon: [name], MD;  Location: [facility] CATH LAB;  Service: Cardiovascular;  Laterality: N/A;  . RECONSTRUCTION OF NOSE     Social History   Occupational History  . Not on file.   Social History Main Topics  . Smoking status: Never Smoker  . Smokeless tobacco: Never Used  . Alcohol use No  . Drug use: No  . Sexual activity: Not on file

## 2017-02-21 LAB — SYNOVIAL CELL COUNT + DIFF, W/ CRYSTALS
Basophils, %: 0 %
Eosinophils-Synovial: 0 % (ref 0–2)
LYMPHOCYTES-SYNOVIAL FLD: 34 % (ref 0–74)
MONOCYTE/MACROPHAGE: 7 % (ref 0–69)
NEUTROPHIL, SYNOVIAL: 59 % — AB (ref 0–24)
Synoviocytes, %: 0 % (ref 0–15)
WBC, Synovial: 5730 cells/uL — ABNORMAL HIGH (ref <150)

## 2017-02-25 LAB — BODY FLUID CULTURE
Gram Stain: NONE SEEN
Organism ID, Bacteria: NO GROWTH

## 2017-05-22 ENCOUNTER — Ambulatory Visit (INDEPENDENT_AMBULATORY_CARE_PROVIDER_SITE_OTHER): Payer: Medicare Other | Admitting: Orthopedic Surgery

## 2017-05-29 ENCOUNTER — Ambulatory Visit (INDEPENDENT_AMBULATORY_CARE_PROVIDER_SITE_OTHER): Payer: Medicare Other

## 2017-05-29 ENCOUNTER — Ambulatory Visit (INDEPENDENT_AMBULATORY_CARE_PROVIDER_SITE_OTHER): Payer: Medicare Other | Admitting: Orthopedic Surgery

## 2017-05-29 ENCOUNTER — Encounter (INDEPENDENT_AMBULATORY_CARE_PROVIDER_SITE_OTHER): Payer: Self-pay | Admitting: Orthopedic Surgery

## 2017-05-29 VITALS — BP 136/83 | HR 68 | Ht 66.0 in | Wt 156.0 lb

## 2017-05-29 DIAGNOSIS — M7542 Impingement syndrome of left shoulder: Secondary | ICD-10-CM

## 2017-05-29 DIAGNOSIS — M25512 Pain in left shoulder: Secondary | ICD-10-CM

## 2017-05-29 MED ORDER — LIDOCAINE HCL 1 % IJ SOLN
2.0000 mL | INTRAMUSCULAR | Status: AC | PRN
  Administered 2017-05-29: 2 mL

## 2017-05-29 MED ORDER — BUPIVACAINE HCL 0.5 % IJ SOLN
2.0000 mL | INTRAMUSCULAR | Status: AC | PRN
  Administered 2017-05-29: 2 mL via INTRA_ARTICULAR

## 2017-05-29 MED ORDER — METHYLPREDNISOLONE ACETATE 40 MG/ML IJ SUSP
80.0000 mg | INTRAMUSCULAR | Status: AC | PRN
  Administered 2017-05-29: 80 mg

## 2017-05-29 NOTE — Progress Notes (Signed)
Office Visit Note   Patient: Dale [name]           Date of Birth: Dec 20, 1945           MRN: [id number] Visit Date: 05/29/2017              Requested by: [name], [place], [place] [address] PCP: [name], MD   Assessment & Plan: Visit Diagnoses:  1. Acute pain of left shoulder   2. Impingement syndrome of left shoulder     Plan:  #1: Subacromial corticosteroid injection to the left shoulder #2: 1 week of complete rest of the left shoulder and then he may try to do some pickle ball. #3: If no better then call and we'll schedule an MRI scan of the left shoulder.  Follow-Up Instructions: Return if symptoms worsen or fail to improve.   Orders:  Orders Placed This Encounter  Procedures  . XR Shoulder Left   No orders of the defined types were placed in this encounter.     Procedures: Large Joint Inj Date/Time: 05/29/2017 3:03 PM Performed by: Biagio Borg D Authorized by: Biagio Borg D   Consent Given by:  Patient Timeout: prior to procedure the correct patient, procedure, and site was verified   Indications:  Pain Location:  Shoulder Site:  L subacromial bursa Prep: patient was prepped and draped in usual sterile fashion   Needle Size:  25 G Needle Length:  1.5 inches Approach:  Lateral Ultrasound Guidance: No   Fluoroscopic Guidance: No   Arthrogram: No   Medications:  80 mg methylPREDNISolone acetate 40 MG/ML; 2 mL lidocaine 1 %; 2 mL bupivacaine 0.5 % Aspiration Attempted: No   Patient tolerance:  Patient tolerated the procedure well with no immediate complications     Clinical Data: No additional findings.   Subjective: Chief Complaint  Patient presents with  . Shoulder Pain    left shoulder pain, 6 - 8 weeks. no fall, no injury. elevating arm or stretching left arm above head causes most pain.  alternates from sharp to dull pain depending on motion.  meloxicam for pain.    Dale [name] is a very  pleasant 71 year old white male who is seen today for new problem. He states over the last 6-8 weeks he is developed left shoulder pain especially when he is playing pickle ball. This started about 8 weeks ago or so and after about the 2 weeks of rest his pain improved in the shoulder area and he went back to playing. He did one game and he started having the pain again so then he took the next 4 weeks off. Since that time he has had increasing pain in the shoulder. His pain is mainly in the shoulder joint itself some anteriorly but it does radiate down to about the midportion of the proximal humerus. He denies any numbness or tingling. He did have some pain and discomfort in the lateral epicondylar area and down into the forearm but that the improved with rest and stretching. He is not complaining of pain at that area now. He also hurts more as he goes into forward flexion and lives there with his arms stretched out above his head. He has been using Voltaren gel intermittently also with his meloxicam. Seen today for evaluation. Denies any cervical spine pain. Denies any elbow pain at this time.     Review of Systems  All other systems reviewed and are negative.  Objective: Vital Signs: BP 136/83 (BP Location: Right Arm, Patient Position: Sitting, Cuff Size: Normal)   Pulse 68   Ht 5\' 6"  (1.676 m)   Wt 156 lb (70.8 kg)   BMI 25.18 kg/m   Physical Exam  Constitutional: He is oriented to person, place, and time. He appears well-developed and well-nourished.  HENT:  Head: Normocephalic and atraumatic.  Eyes: EOM are normal. Pupils are equal, round, and reactive to light.  Pulmonary/Chest: Effort normal.  Neurological: He is alert and oriented to person, place, and time.  Skin: Skin is warm and dry.  Psychiatric: He has a normal mood and affect. His behavior is normal. Judgment and thought content normal.    Ortho Exam  Exam today reveals 170 of forward flexion 160 of abduction of the  left shoulder. With the arm at 90 of abduction he has around 80 of external rotation with some pain. Internal rotation to about 70. He is not particular painful over his before meals joint. Empty can testing does give him some pain at this time but he states is mild. Negative apprehension. Range of motion of the cervical spine does not elicit any pain into her shoulder up. He has no lateral epicondylar pain today nor does he have any pain with the resistance against the dorsiflexion of the wrist with the elbow in full extension. Specialty Comments:  No specialty comments available.  Imaging: Xr Shoulder Left  Result Date: 05/29/2017 Three-view x-ray of the left shoulder reveals a type II acromion. Some acromioclavicular joint sclerosing. Maintaining joint space glenohumeral.   Current Outpatient Prescriptions  Medication Sig Dispense Refill  . aspirin EC 81 MG tablet Take 81 mg by mouth daily.    Marland Kitchen atorvastatin (LIPITOR) 10 MG tablet Take 10 mg by mouth at bedtime.    . B-D 3CC LUER-LOK SYR 22GX1" 22G X 1" 3 ML MISC See admin instructions.  0  . Calcium & Magnesium Carbonates (MYLANTA PO) Take 10 mLs by mouth. Before meals    . Calcium Carbonate Antacid (TUMS E-X PO) Take 1,000 mg by mouth daily as needed (for stomach heartburn).    . clonazePAM (KLONOPIN) 0.5 MG tablet TAKE 1/2 TAB BY MOUTH 3 TIMES A DAY AS NEEDED  0  . diclofenac sodium (VOLTAREN) 1 % GEL Apply 2 g topically daily as needed (for muscle pains).    . fluticasone (FLONASE) 50 MCG/ACT nasal spray Place 1 spray into the nose daily as needed for rhinitis or allergies.    . hydrocortisone 2.5 % cream 1 APPLICATION APPLY ON THE SKIN AS DIRECTED APPLY AS DIRECTED WITH FLARES ONLY  2  . lisinopril (PRINIVIL,ZESTRIL) 5 MG tablet Take 5 mg by mouth daily.  1  . metoprolol succinate (TOPROL-XL) 50 MG 24 hr tablet TAKE 1 TABLET (50 MG TOTAL) BY MOUTH DAILY. 30 tablet 11  . nitroGLYCERIN (NITROSTAT) 0.4 MG SL tablet Place 0.4 mg under  the tongue every 5 (five) minutes as needed for chest pain.    . pantoprazole (PROTONIX) 40 MG tablet Take 40 mg by mouth daily.    . ranitidine (ZANTAC) 150 MG tablet Take 150 mg by mouth 2 (two) times daily.    . sertraline (ZOLOFT) 50 MG tablet Take 50 mg by mouth daily.  0  . Sildenafil Citrate (VIAGRA PO) Take by mouth as needed.    . Soft Lens Products (REWETTING DROPS) SOLN 1 drop by Does not apply route daily as needed (for dry eyes).    Marland Kitchen testosterone  cypionate (DEPOTESTOSTERONE CYPIONATE) 200 MG/ML injection INJECT 1/2ML INTRAMUSCULARLY EVERY WEEK  0  . tiZANidine (ZANAFLEX) 2 MG tablet Take 2 mg by mouth daily as needed. Every 8 hours daily by mouth as needed muscle spasms.  1   No current facility-administered medications for this visit.     PMFS History: Patient Active Problem List   Diagnosis Date Noted  . Unilateral primary osteoarthritis, left knee 02/20/2017  . Benign neoplasm of colon 03/02/2014  . Premature ventricular contractions 01/04/2014  . Hypercholesteremia   . GERD (gastroesophageal reflux disease)   . HTN (hypertension)   . ED (erectile dysfunction)   . Allergic rhinitis   . Exertional angina (HCC) 05/01/2013    Class: Acute  . Abnormal nuclear stress test 05/01/2013    Class: Acute   Past Medical History:  Diagnosis Date  . Abnormal nuclear stress test   . Allergic rhinitis   . Arthritis    episodic  . ED (erectile dysfunction)   . Exertional angina (HCC)   . GERD (gastroesophageal reflux disease)   . H/O colonoscopy with polypectomy    tubular adenomas and hyperplastic polyps  . HTN (hypertension)   . Hypercholesteremia   . Seasonal allergies   . Skin cancer, basal cell     Family History  Problem Relation Age of Onset  . Heart disease Father   . Hypertension Mother   . Stroke Mother     Past Surgical History:  Procedure Laterality Date  . APPENDECTOMY    . basal cell removed from nose    . COLONOSCOPY N/A 03/02/2014   Procedure:  COLONOSCOPY;  Surgeon: [name], MD;  Location: [facility];  Service: Endoscopy;  Laterality: N/A;  . HOT HEMOSTASIS N/A 03/02/2014   Procedure: HOT HEMOSTASIS (ARGON PLASMA COAGULATION/BICAP);  Surgeon: [name], MD;  Location: [facility];  Service: Endoscopy;  Laterality: N/A;  . INGUINAL HERNIA REPAIR Bilateral   . KNEE ARTHROSCOPY Bilateral   . KNEE SURGERY    . LEFT HEART CATHETERIZATION WITH CORONARY ANGIOGRAM N/A 05/01/2013   Procedure: LEFT HEART CATHETERIZATION WITH CORONARY ANGIOGRAM;  Surgeon: [name], MD;  Location: [facility] CATH LAB;  Service: Cardiovascular;  Laterality: N/A;  . RECONSTRUCTION OF NOSE     Social History   Occupational History  . Not on file.   Social History Main Topics  . Smoking status: Never Smoker  . Smokeless tobacco: Never Used  . Alcohol use No  . Drug use: No  . Sexual activity: Not on file

## 2017-05-30 ENCOUNTER — Other Ambulatory Visit: Payer: Self-pay | Admitting: Interventional Cardiology

## 2017-06-24 NOTE — Progress Notes (Signed)
Cardiology Office Note    Date:  06/25/2017   ID:  Kayo, Zion 01/21/1946, MRN [id number]  PCP:  [name], MD  Cardiologist: [name], MD   Chief Complaint  Patient presents with  . Chest Pain    History of Present Illness:  Dale [name] is a 71 y.o. 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  He is doing well. He is exercising. His only limitation is knee arthritis. He is beginning to work out for the upcoming football season. There are no palpitations or specific cardiac complaints.  Past Medical History:  Diagnosis Date  . Abnormal nuclear stress test   . Allergic rhinitis   . Arthritis    episodic  . ED (erectile dysfunction)   . Exertional angina (HCC)   . GERD (gastroesophageal reflux disease)   . H/O colonoscopy with polypectomy    tubular adenomas and hyperplastic polyps  . HTN (hypertension)   . Hypercholesteremia   . Seasonal allergies   . Skin cancer, basal cell     Past Surgical History:  Procedure Laterality Date  . APPENDECTOMY    . basal cell removed from nose    . COLONOSCOPY N/A 03/02/2014   Procedure: COLONOSCOPY;  Surgeon: [name], MD;  Location: [facility];  Service: Endoscopy;  Laterality: N/A;  . HOT HEMOSTASIS N/A 03/02/2014   Procedure: HOT HEMOSTASIS (ARGON PLASMA COAGULATION/BICAP);  Surgeon: [name], MD;  Location: [facility];  Service: Endoscopy;  Laterality: N/A;  . INGUINAL HERNIA REPAIR Bilateral   . KNEE ARTHROSCOPY Bilateral   . KNEE SURGERY    . LEFT HEART CATHETERIZATION WITH CORONARY ANGIOGRAM N/A 05/01/2013   Procedure: LEFT HEART CATHETERIZATION WITH CORONARY ANGIOGRAM;  Surgeon: [name], MD;  Location: [facility] CATH LAB;  Service: Cardiovascular;  Laterality: N/A;  . RECONSTRUCTION OF NOSE      Current Medications: Outpatient  Medications Prior to Visit  Medication Sig Dispense Refill  . aspirin EC 81 MG tablet Take 81 mg by mouth daily.    Marland Kitchen atorvastatin (LIPITOR) 10 MG tablet Take 10 mg by mouth at bedtime.    . B-D 3CC LUER-LOK SYR 22GX1" 22G X 1" 3 ML MISC See admin instructions.  0  . Calcium & Magnesium Carbonates (MYLANTA PO) Take 10 mLs by mouth. Before meals    . Calcium Carbonate Antacid (TUMS E-X PO) Take 1,000 mg by mouth daily as needed (for stomach heartburn).    . clonazePAM (KLONOPIN) 0.5 MG tablet TAKE 1/2 TAB BY MOUTH 3 TIMES A DAY AS NEEDED  0  . diclofenac sodium (VOLTAREN) 1 % GEL Apply 2 g topically daily as needed (for muscle pains).    . fluticasone (FLONASE) 50 MCG/ACT nasal spray Place 1 spray into the nose daily as needed for rhinitis or allergies.    . hydrocortisone 2.5 % cream 1 APPLICATION APPLY ON THE SKIN AS DIRECTED APPLY AS DIRECTED WITH FLARES ONLY  2  . lisinopril (PRINIVIL,ZESTRIL) 5 MG tablet Take 5 mg by mouth daily.  1  . metoprolol succinate (TOPROL-XL) 50 MG 24 hr tablet TAKE 1 TABLET (50 MG TOTAL) BY MOUTH DAILY. 30 tablet 0  . nitroGLYCERIN (NITROSTAT) 0.4 MG SL tablet Place 0.4 mg under the tongue every 5 (five) minutes as needed for chest pain.    . pantoprazole (PROTONIX) 40 MG tablet Take 40 mg by mouth daily.    Marland Kitchen  ranitidine (ZANTAC) 150 MG tablet Take 150 mg by mouth 2 (two) times daily.    . sertraline (ZOLOFT) 50 MG tablet Take 50 mg by mouth daily.  0  . Sildenafil Citrate (VIAGRA PO) Take by mouth as needed.    . Soft Lens Products (REWETTING DROPS) SOLN 1 drop by Does not apply route daily as needed (for dry eyes).    Marland Kitchen testosterone cypionate (DEPOTESTOSTERONE CYPIONATE) 200 MG/ML injection INJECT 1/2ML INTRAMUSCULARLY EVERY WEEK  0  . tiZANidine (ZANAFLEX) 2 MG tablet Take 2 mg by mouth daily as needed. Every 8 hours daily by mouth as needed muscle spasms.  1   No facility-administered medications prior to visit.      Allergies:   Penicillins and  Pneumococcal vaccines   Social History   Social History  . Marital status: Married    Spouse name: N/A  . Number of children: 2  . Years of education: N/A   Social History Main Topics  . Smoking status: Never Smoker  . Smokeless tobacco: Never Used  . Alcohol use No  . Drug use: No  . Sexual activity: Not Asked   Other Topics Concern  . None   Social History Narrative  . None     Family History:  The patient's family history includes Heart disease in his father; Hypertension in his mother; Stroke in his mother.   ROS:   Please see the history of present illness.    Bilateral knee arthritis, easy bruising, otherwise no complaints. Has some depression and anxiety that has been straightened out with medication since the last visit. Difficulty with low back discomfort as well. All other systems reviewed and are negative.   PHYSICAL EXAM:   VS:  BP 116/70   Pulse 70   Ht 5\' 7"  (1.702 m)   Wt 158 lb (71.7 kg)   SpO2 98%   BMI 24.75 kg/m    GEN: Well nourished, well developed, in no acute distress  HEENT: normal  Neck: no JVD, carotid bruits, or masses Cardiac: RRR; no murmurs, rubs, or gallops,no edema  Respiratory:  clear to auscultation bilaterally, normal work of breathing GI: soft, nontender, nondistended, + BS MS: no deformity or atrophy  Skin: warm and dry, no rash Neuro:  Alert and Oriented x 3, Strength and sensation are intact Psych: euthymic mood, full affect  Wt Readings from Last 3 Encounters:  06/25/17 158 lb (71.7 kg)  05/29/17 156 lb (70.8 kg)  02/20/17 168 lb (76.2 kg)      Studies/Labs Reviewed:   EKG:  EKG  Normal sinus rhythm, normal overall appearance.  Recent Labs: No results found for requested labs within last 8760 hours.   Lipid Panel No results found for: CHOL, TRIG, HDL, CHOLHDL, VLDL, LDLCALC, LDLDIRECT  Additional studies/ records that were reviewed today include:  None    ASSESSMENT:    1. Exertional angina (HCC)   2.  Essential hypertension   3. Premature ventricular contractions   4. Hypercholesteremia      PLAN:  In order of problems listed above:  1. Does not limit the patient and no recent episodes over the past year. 2. Excellent control. Low salt diet and weight control area 3. Asymptomatic. 4. Lipids are managed with atorvastatin 10 mg per day. LDL target should remain less than 100 and preferably at 70.  We discussed when necessary follow-up. He prefers yearly follow-up at least for one more year until he gets to his final 4 biopsies and  is around for repeat. The plan then will be for him to return in one year.  Medication Adjustments/Labs and Tests Ordered: Current medicines are reviewed at length with the patient today.  Concerns regarding medicines are outlined above.  Medication changes, Labs and Tests ordered today are listed in the Patient Instructions below. Patient Instructions  Medication Instructions:  None  Labwork: None  Testing/Procedures: None  Follow-Up: Your physician wants you to follow-up in: 1 year with Dr. Tamala Julian.  You will receive a reminder letter in the mail two months in advance. If you don't receive a letter, please call our office to schedule the follow-up appointment.   Any Other Special Instructions Will Be Listed Below (If Applicable).     If you need a refill on your cardiac medications before your next appointment, please call your pharmacy.      Signed, [name], MD  06/25/2017 9:43 AM    [facility] [address], [place], [place]  [address] Phone: [phone]; Fax: [phone]

## 2017-06-25 ENCOUNTER — Encounter: Payer: Self-pay | Admitting: Interventional Cardiology

## 2017-06-25 ENCOUNTER — Ambulatory Visit (INDEPENDENT_AMBULATORY_CARE_PROVIDER_SITE_OTHER): Payer: Medicare Other | Admitting: Interventional Cardiology

## 2017-06-25 ENCOUNTER — Other Ambulatory Visit: Payer: Self-pay | Admitting: Interventional Cardiology

## 2017-06-25 VITALS — BP 116/70 | HR 70 | Ht 67.0 in | Wt 158.0 lb

## 2017-06-25 DIAGNOSIS — E7800 Pure hypercholesterolemia, unspecified: Secondary | ICD-10-CM | POA: Diagnosis not present

## 2017-06-25 DIAGNOSIS — I493 Ventricular premature depolarization: Secondary | ICD-10-CM | POA: Diagnosis not present

## 2017-06-25 DIAGNOSIS — I10 Essential (primary) hypertension: Secondary | ICD-10-CM | POA: Diagnosis not present

## 2017-06-25 DIAGNOSIS — I2089 Other forms of angina pectoris: Secondary | ICD-10-CM

## 2017-06-25 DIAGNOSIS — I208 Other forms of angina pectoris: Secondary | ICD-10-CM

## 2017-06-25 NOTE — Patient Instructions (Signed)

## 2017-07-10 ENCOUNTER — Ambulatory Visit (INDEPENDENT_AMBULATORY_CARE_PROVIDER_SITE_OTHER): Payer: Medicare Other

## 2017-07-10 ENCOUNTER — Encounter (INDEPENDENT_AMBULATORY_CARE_PROVIDER_SITE_OTHER): Payer: Self-pay | Admitting: Orthopedic Surgery

## 2017-07-10 ENCOUNTER — Ambulatory Visit (INDEPENDENT_AMBULATORY_CARE_PROVIDER_SITE_OTHER): Payer: Medicare Other | Admitting: Orthopedic Surgery

## 2017-07-10 VITALS — BP 132/57 | HR 60 | Resp 15 | Ht 67.0 in | Wt 155.0 lb

## 2017-07-10 DIAGNOSIS — M545 Low back pain, unspecified: Secondary | ICD-10-CM

## 2017-07-10 DIAGNOSIS — G8929 Other chronic pain: Secondary | ICD-10-CM

## 2017-07-10 DIAGNOSIS — M51379 Other intervertebral disc degeneration, lumbosacral region without mention of lumbar back pain or lower extremity pain: Secondary | ICD-10-CM | POA: Insufficient documentation

## 2017-07-10 DIAGNOSIS — M5137 Other intervertebral disc degeneration, lumbosacral region: Secondary | ICD-10-CM | POA: Diagnosis not present

## 2017-07-10 MED ORDER — PREDNISONE 5 MG (21) PO TBPK: ORAL_TABLET | ORAL | 0 refills | Status: DC

## 2017-07-10 NOTE — Progress Notes (Signed)
Office Visit Note   Patient: Dale [name]           Date of Birth: 27-Sep-1946           MRN: [id number] Visit Date: 07/10/2017              Requested by: Dale [name], [place], [place] [address] PCP: Dale Cruel, MD   Assessment & Plan: Visit Diagnoses:  1. Left-sided low back pain without sciatica, unspecified chronicity   2. Chronic bilateral low back pain without sciatica   3. DDD (degenerative disc disease), lumbosacral     Plan:  #1: Since he has had this in the past and was treated with a dose pack and physical therapy that will be our plan today. #2: If he does not have resolution of this within the next week or so then we should plan on an MRI scan of this area looking for pathology. #3: Follow back up in 1 week if he is not doing well.  Follow-Up Instructions: Return in about 1 week (around 07/17/2017).   Orders:  Orders Placed This Encounter  Procedures  . XR Lumbar Spine 2-3 Views  . XR Pelvis 1-2 Views   Meds ordered this encounter  Medications  . predniSONE (STERAPRED UNI-PAK 21 TAB) 5 MG (21) TBPK tablet    Sig: Use as directed per package instructions    Dispense:  21 tablet    Refill:  0    Order Specific Question:   Supervising Provider    Answer:   Dale [name] [[id number]]      Procedures: No procedures performed   Clinical Data: No additional findings.   Subjective: Chief Complaint  Patient presents with  . Lower Back - Pain  . Back Pain    Low back pain x 7 weeks, difficulty bending, no injury, no surgery, not diabetic, meloxicam, heat, walking, stretching, physical therapy in past, no sciatica    Dale [name] a very pleasant 71 year old white male who presents today with a seventy-week history of lower back pain without radicular symptoms. He does not remember any particular history of injury or trauma at this time. But he has noted that he is having difficulty with the pain. He's tried his stretching,  Voltaren gel, heat, meloxicam, and walking. In the past he has had difficulties and was treated with physical therapy he states as well as  prednisone. 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 Denies any numbness or tingling in the lower extremities. No bowel or bladder incontinence. He is more tender to the left side of his lumbar spine near the SI joint..          Review of Systems  Constitutional: Negative for fever.  Eyes: Negative for visual disturbance.  Respiratory: Negative for shortness of breath.   Cardiovascular: Negative for leg swelling.  Gastrointestinal: Negative for constipation.  Endocrine: Negative for polyuria.  Genitourinary: Negative for frequency.  Musculoskeletal: Positive for back pain and neck pain. Negative for arthralgias, gait problem and joint swelling.  Neurological: Negative for weakness and numbness.  Hematological: Negative for adenopathy.  Psychiatric/Behavioral: Negative for sleep disturbance. The patient is not nervous/anxious.   All other systems reviewed and are negative.    Objective: Vital Signs: BP (!) 132/57 (BP Location: Left Arm, Patient Position: Sitting, Cuff Size: Normal)   Pulse 60   Resp 15   Ht 5\' 7"  (1.702  m)   Wt 155 lb (70.3 kg)   BMI 24.28 kg/m   Physical Exam  Constitutional: He is oriented to person, place, and time. He appears well-developed and well-nourished.  HENT:  Head: Normocephalic and atraumatic.  Eyes: Pupils are equal, round, and reactive to light. EOM are normal.  Pulmonary/Chest: Effort normal.  Neurological: He is alert and oriented to person, place, and time.  Skin: Skin is warm and dry.  Psychiatric: He has a normal mood and affect. His behavior is normal. Judgment and thought content normal.    Ortho Exam  He is tender to palpation over the SI joint more on the left than on the right. He has negative straight leg raising. Sensation is intact  to light touch. Excellent strength in the lower extremities bilateral symmetric. He certainly does have crepitance of the left knee with range of motion. Deep tendon reflexes were trace to 1+ bilateral and symmetric in the lower extremities. Calves are supple nontender. Neurovascular intact distally. Internal and external rotation of the hip does cause some pain in the lumbar spine. No groin pain. Mr. Dale [name] is a very pleasant 71 year old white male who is seen today for evaluation of his left knee and thigh. He states he was walking down the sidewalk and apparently missed a step and came down fairly hard on his left leg. He did not fall.  Specialty Comments:  No specialty comments available.  Imaging: Xr Lumbar Spine 2-3 Views  Result Date: 07/10/2017 Two-view x-ray of the lumbar spine reveals L5-S1 disc space narrowing with the anterior osteophyte off of the inferior L5 vertebral body. He does have some facet changes on the lateral. He does have some left lateral osteophyte formation which appears to be at L4-5.  Xr Pelvis 1-2 Views  Result Date: 07/10/2017 Single view x-ray of the pelvis reveals some degenerative changes in the left  SI joint. Hip appears to be well-maintained with good joint space.    PMFS History: Patient Active Problem List   Diagnosis Date Noted  . DDD (degenerative disc disease), lumbosacral 07/10/2017  . Unilateral primary osteoarthritis, left knee 02/20/2017  . Benign neoplasm of colon 03/02/2014  . Premature ventricular contractions 01/04/2014  . Hypercholesteremia   . GERD (gastroesophageal reflux disease)   . HTN (hypertension)   . ED (erectile dysfunction)   . Allergic rhinitis   . Exertional angina (HCC) 05/01/2013    Class: Acute  . Abnormal nuclear stress test 05/01/2013    Class: Acute   Past Medical History:  Diagnosis Date  . Abnormal nuclear stress test   . Allergic rhinitis   . Arthritis    episodic  . ED (erectile dysfunction)   .  Exertional angina (HCC)   . GERD (gastroesophageal reflux disease)   . H/O colonoscopy with polypectomy    tubular adenomas and hyperplastic polyps  . HTN (hypertension)   . Hypercholesteremia   . Seasonal allergies   . Skin cancer, basal cell     Family History  Problem Relation Age of Onset  . Heart disease Father   . Hypertension Mother   . Stroke Mother     Past Surgical History:  Procedure Laterality Date  . APPENDECTOMY    . basal cell removed from nose    . COLONOSCOPY N/A 03/02/2014   Procedure: COLONOSCOPY;  Surgeon: [name], MD;  Location: [facility];  Service: Endoscopy;  Laterality: N/A;  . HOT HEMOSTASIS N/A 03/02/2014   Procedure: HOT HEMOSTASIS (ARGON PLASMA COAGULATION/BICAP);  Surgeon: [name], MD;  Location: [facility];  Service: Endoscopy;  Laterality: N/A;  . INGUINAL HERNIA REPAIR Bilateral   . KNEE ARTHROSCOPY Bilateral   . KNEE SURGERY    . LEFT HEART CATHETERIZATION WITH CORONARY ANGIOGRAM N/A 05/01/2013   Procedure: LEFT HEART CATHETERIZATION WITH CORONARY ANGIOGRAM;  Surgeon: [name], MD;  Location: [facility] CATH LAB;  Service: Cardiovascular;  Laterality: N/A;  . RECONSTRUCTION OF NOSE     Social History   Occupational History  . Not on file.   Social History Main Topics  . Smoking status: Never Smoker  . Smokeless tobacco: Never Used  . Alcohol use No  . Drug use: No  . Sexual activity: Not on file

## 2017-07-11 ENCOUNTER — Other Ambulatory Visit: Payer: Self-pay | Admitting: *Deleted

## 2017-07-11 ENCOUNTER — Other Ambulatory Visit (INDEPENDENT_AMBULATORY_CARE_PROVIDER_SITE_OTHER): Payer: Self-pay

## 2017-07-11 ENCOUNTER — Telehealth (INDEPENDENT_AMBULATORY_CARE_PROVIDER_SITE_OTHER): Payer: Self-pay | Admitting: Orthopaedic Surgery

## 2017-07-11 DIAGNOSIS — M545 Low back pain, unspecified: Secondary | ICD-10-CM

## 2017-07-11 MED ORDER — PREDNISONE 5 MG (21) PO TBPK: ORAL_TABLET | ORAL | 0 refills | Status: DC

## 2017-07-11 NOTE — Telephone Encounter (Signed)
Patient states rx for Prednisone dose pack did not get to pharmacy. [facility]. Please resend, and call patient to let him know when rx sent go.

## 2017-07-11 NOTE — Telephone Encounter (Signed)
Paper faxed in rx

## 2018-03-16 ENCOUNTER — Other Ambulatory Visit: Payer: Self-pay | Admitting: Interventional Cardiology

## 2018-03-17 NOTE — Telephone Encounter (Signed)
Outpatient Medication Detail    Disp Refills Start End   metoprolol succinate (TOPROL-XL) 50 MG 24 hr tablet 30 tablet 11 06/25/2017    Sig - Route: TAKE 1 TABLET (50 MG TOTAL) BY MOUTH DAILY. - Oral   Sent to pharmacy as: metoprolol succinate (TOPROL-XL) 50 MG 24 hr tablet   E-Prescribing Status: Receipt confirmed by pharmacy (06/25/2017 12:31 PM EDT)   Pharmacy   [id number] - [place] - [place]

## 2018-07-03 HISTORY — PX: CATARACT EXTRACTION W/ INTRAOCULAR LENS  IMPLANT, BILATERAL: SHX1307

## 2018-08-19 NOTE — Progress Notes (Signed)
Cardiology Office Note:    Date:  08/21/2018   ID:  LUIE LANEVE, DOB Dec 06, 1945, MRN [id number]  PCP:  [name], MD  Cardiologist:  No primary care provider on file.   Referring MD: [name], MD   Chief Complaint  Patient presents with  . Coronary Artery Disease    History of Present Illness:    Dale [name] is a 72 y.o. 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   No cardiac complaints.  Staying active but limited by her knees.  No nitroglycerin use.  Denies orthopnea, PND, and edema.   Past Medical History:  Diagnosis Date  . Abnormal nuclear stress test   . Allergic rhinitis   . Arthritis    episodic  . ED (erectile dysfunction)   . Exertional angina (HCC)   . GERD (gastroesophageal reflux disease)   . H/O colonoscopy with polypectomy    tubular adenomas and hyperplastic polyps  . HTN (hypertension)   . Hypercholesteremia   . Seasonal allergies   . Skin cancer, basal cell     Past Surgical History:  Procedure Laterality Date  . APPENDECTOMY    . basal cell removed from nose    . COLONOSCOPY N/A 03/02/2014   Procedure: COLONOSCOPY;  Surgeon: [name], MD;  Location: [facility];  Service: Endoscopy;  Laterality: N/A;  . HOT HEMOSTASIS N/A 03/02/2014   Procedure: HOT HEMOSTASIS (ARGON PLASMA COAGULATION/BICAP);  Surgeon: [name], MD;  Location: [facility];  Service: Endoscopy;  Laterality: N/A;  . INGUINAL HERNIA REPAIR Bilateral   . KNEE ARTHROSCOPY Bilateral   . KNEE SURGERY    . LEFT HEART CATHETERIZATION WITH CORONARY ANGIOGRAM N/A 05/01/2013   Procedure: LEFT HEART CATHETERIZATION WITH CORONARY ANGIOGRAM;  Surgeon: Dale Grooms, MD;  Location: [facility] CATH LAB;  Service: Cardiovascular;  Laterality: N/A;  . RECONSTRUCTION OF NOSE      Current Medications: Current  Meds  Medication Sig  . aspirin EC 81 MG tablet Take 81 mg by mouth daily.  Marland Kitchen atorvastatin (LIPITOR) 10 MG tablet Take 10 mg by mouth at bedtime.  . B-D 3CC LUER-LOK SYR 22GX1" 22G X 1" 3 ML MISC See admin instructions.  . Calcium & Magnesium Carbonates (MYLANTA PO) Take 10 mLs by mouth. Before meals  . Calcium Carbonate Antacid (TUMS E-X PO) Take 1,000 mg by mouth daily as needed (for stomach heartburn).  . clonazePAM (KLONOPIN) 0.5 MG tablet TAKE 1/2 TAB BY MOUTH 3 TIMES A DAY AS NEEDED  . diclofenac sodium (VOLTAREN) 1 % GEL Apply 2 g topically daily as needed (for muscle pains).  . fluticasone (FLONASE) 50 MCG/ACT nasal spray Place 1 spray into the nose daily as needed for rhinitis or allergies.  . hydrocortisone 2.5 % cream 1 APPLICATION APPLY ON THE SKIN AS DIRECTED APPLY AS DIRECTED WITH FLARES ONLY  . metoprolol succinate (TOPROL-XL) 50 MG 24 hr tablet TAKE 1 TABLET (50 MG TOTAL) BY MOUTH DAILY.  . nitroGLYCERIN (NITROSTAT) 0.4 MG SL tablet Place 0.4 mg under the tongue every 5 (five) minutes as needed for chest pain.  . pantoprazole (PROTONIX) 40 MG tablet Take 40 mg by mouth as needed.   . sertraline (ZOLOFT) 50 MG tablet Take 50 mg by mouth daily.  . Sildenafil Citrate (VIAGRA PO) Take by mouth as needed.  . Soft Lens Products (REWETTING DROPS) SOLN 1 drop  by Does not apply route daily as needed (for dry eyes).  Marland Kitchen testosterone cypionate (DEPOTESTOSTERONE CYPIONATE) 200 MG/ML injection INJECT 1/2ML INTRAMUSCULARLY EVERY WEEK  . tiZANidine (ZANAFLEX) 2 MG tablet Take 2 mg by mouth daily as needed. Every 8 hours daily by mouth as needed muscle spasms.  . [DISCONTINUED] lisinopril (PRINIVIL,ZESTRIL) 5 MG tablet Take 5 mg by mouth daily.     Allergies:   Penicillins and Pneumococcal vaccines   Social History   Socioeconomic History  . Marital status: Married    Spouse name: Not on file  . Number of children: 2  . Years of education: Not on file  . Highest education level: Not on  file  Occupational History  . Not on file  Social Needs  . Financial resource strain: Not on file  . Food insecurity:    Worry: Not on file    Inability: Not on file  . Transportation needs:    Medical: Not on file    Non-medical: Not on file  Tobacco Use  . Smoking status: Never Smoker  . Smokeless tobacco: Never Used  Substance and Sexual Activity  . Alcohol use: No  . Drug use: No  . Sexual activity: Not on file  Lifestyle  . Physical activity:    Days per week: Not on file    Minutes per session: Not on file  . Stress: Not on file  Relationships  . Social connections:    Talks on phone: Not on file    Gets together: Not on file    Attends religious service: Not on file    Active member of club or organization: Not on file    Attends meetings of clubs or organizations: Not on file    Relationship status: Not on file  Other Topics Concern  . Not on file  Social History Narrative  . Not on file     Family History: The patient's family history includes Heart disease in his father; Hypertension in his mother; Stroke in his mother.  ROS:   Please see the history of present illness.    Bilateral significant knee deformity with ACL and osteoarthritis.  Low testosterone levels being supplemented.  All other systems reviewed and are negative.  EKGs/Labs/Other Studies Reviewed:    The following studies were reviewed today: None  EKG:  EKG iso ordered today.  The ekg ordered today demonstrates sinus rhythm with left ventricular hypertrophy.  No change compared to prior  Recent Labs: No results found for requested labs within last 8760 hours.  Recent Lipid Panel No results found for: CHOL, TRIG, HDL, CHOLHDL, VLDL, LDLCALC, LDLDIRECT  Physical Exam:    VS:  BP (!) 154/76   Pulse (!) 59   Ht 5\' 7"  (1.702 m)   Wt 158 lb (71.7 kg)   BMI 24.75 kg/m     Wt Readings from Last 3 Encounters:  08/21/18 158 lb (71.7 kg)  07/10/17 155 lb (70.3 kg)  06/25/17 158 lb  (71.7 kg)     GEN:  Well nourished, well developed in no acute distress HEENT: Normal NECK: No JVD. LYMPHATICS: No lymphadenopathy CARDIAC: RRR, nomurmur, nogallop, no edema. VASCULAR: 2+ bilateral radial and carotid pulses.  No bruits. RESPIRATORY:  Clear to auscultation without rales, wheezing or rhonchi  ABDOMEN: Soft, non-tender, non-distended, No pulsatile mass, MUSCULOSKELETAL: No deformity  SKIN: Warm and dry NEUROLOGIC:  Alert and oriented x 3 PSYCHIATRIC:  Normal affect   ASSESSMENT:    1. Exertional angina (HCC)  2. Essential hypertension   3. Hypercholesteremia   4. Systolic murmur    PLAN:    In order of problems listed above:  1. None. 2. Elevated and multifactorial including testosterone injections and intrinsic tendency towards hypertension.  Increase lisinopril to 20 mg/day.  Bmet in 2 weeks.  Blood pressure clinic in 1 month.  May need to have lisinopril switch to lisinopril HCTZ if we are not at target. 3. LDL target was adequate when evaluated in April 2019 with LDL of 85.  Clinical follow-up with me in 1 year blood pressure clinic in 1 month.  Basic metabolic panel in 1 to 2 weeks.   Medication Adjustments/Labs and Tests Ordered: Current medicines are reviewed at length with the patient today.  Concerns regarding medicines are outlined above.  Orders Placed This Encounter  Procedures  . Basic metabolic panel  . EKG 12-Lead   Meds ordered this encounter  Medications  . DISCONTD: lisinopril (PRINIVIL,ZESTRIL) 20 MG tablet    Sig: Take 1 tablet (20 mg total) by mouth daily.    Dispense:  90 tablet    Refill:  3    Dose change  . lisinopril (PRINIVIL,ZESTRIL) 20 MG tablet    Sig: Take 1 tablet (20 mg total) by mouth daily.    Dispense:  90 tablet    Refill:  3    Dose change    Patient Instructions  Medication Instructions:  1) INCREASE Lisinopril to 20mg  once daily  Labwork: Your physician recommends that you return for lab work in: 7-14  days (BMET)   Testing/Procedures: None  Follow-Up: Your physician recommends that you schedule a follow-up appointment in: 3-4 weeks with [facility]  Your physician wants you to follow-up in: 1 year with Dr. Tamala Julian.  You will receive a reminder letter in the mail two months in advance. If you don't receive a letter, please call our office to schedule the follow-up appointment.    Any Other Special Instructions Will Be Listed Below (If Applicable).     If you need a refill on your cardiac medications before your next appointment, please call your pharmacy.      Signed, Dale Grooms, MD  08/21/2018 9:17 AM    [place]

## 2018-08-21 ENCOUNTER — Ambulatory Visit (INDEPENDENT_AMBULATORY_CARE_PROVIDER_SITE_OTHER): Payer: Medicare Other | Admitting: Interventional Cardiology

## 2018-08-21 ENCOUNTER — Encounter: Payer: Self-pay | Admitting: Interventional Cardiology

## 2018-08-21 VITALS — BP 154/76 | HR 59 | Ht 67.0 in | Wt 158.0 lb

## 2018-08-21 DIAGNOSIS — I2089 Other forms of angina pectoris: Secondary | ICD-10-CM

## 2018-08-21 DIAGNOSIS — I10 Essential (primary) hypertension: Secondary | ICD-10-CM

## 2018-08-21 DIAGNOSIS — E7800 Pure hypercholesterolemia, unspecified: Secondary | ICD-10-CM

## 2018-08-21 DIAGNOSIS — I208 Other forms of angina pectoris: Secondary | ICD-10-CM

## 2018-08-21 DIAGNOSIS — R011 Cardiac murmur, unspecified: Secondary | ICD-10-CM

## 2018-08-21 MED ORDER — LISINOPRIL 20 MG PO TABS: 20.0000 mg | ORAL_TABLET | Freq: Every day | ORAL | 3 refills | Status: DC

## 2018-08-21 NOTE — Patient Instructions (Signed)
Medication Instructions:  1) INCREASE Lisinopril to 20mg  once daily  Labwork: Your physician recommends that you return for lab work in: 7-14 days (BMET)   Testing/Procedures: None  Follow-Up: Your physician recommends that you schedule a follow-up appointment in: 3-4 weeks with Hypertension Clinic  Your physician wants you to follow-up in: 1 year with Dr. Tamala Julian.  You will receive a reminder letter in the mail two months in advance. If you don't receive a letter, please call our office to schedule the follow-up appointment.    Any Other Special Instructions Will Be Listed Below (If Applicable).     If you need a refill on your cardiac medications before your next appointment, please call your pharmacy.

## 2018-09-01 ENCOUNTER — Telehealth: Payer: Self-pay

## 2018-09-01 NOTE — Telephone Encounter (Signed)
   [facility] Pre-operative Risk Assessment    Request for surgical clearance:  1. What type of surgery is being performed?  Left Total Knee   2. When is this surgery scheduled? 09/16/18   3. What type of clearance is required (medical clearance vs. Pharmacy clearance to hold med vs. Both)? Both  4. Are there any medications that need to be held prior to surgery and how long? 7 days prior; ASA   5. Practice name and name of physician performing surgery? Dr Vida Roller [facility]   6. What is your office phone number [phone]    7.   What is your office fax number [phone]  8.   Anesthesia type (None, local, MAC, general) ? General   Dale [name] 09/01/2018, 5:32 PM  _________________________________________________________________   (provider comments below)

## 2018-09-02 NOTE — Telephone Encounter (Signed)
The patient is cleared for upcoming surgery.  Okay to hold aspirin 7 days prior to procedure.  Recently seen in the office and was very stable.  No preprocedure testing is needed.

## 2018-09-02 NOTE — Telephone Encounter (Signed)
   Primary Cardiologist: [name], MD  Chart reviewed as part of pre-operative protocol coverage.   Dr. Tamala Julian, can you provide clearance? You have seen the patient on 08/21/18.  Please forward your response to [facility] PREOP.   Thank you  Leanor Kail, PA 09/02/2018, 1:16 PM

## 2018-09-03 ENCOUNTER — Other Ambulatory Visit: Payer: Medicare Other | Admitting: *Deleted

## 2018-09-03 DIAGNOSIS — I208 Other forms of angina pectoris: Secondary | ICD-10-CM

## 2018-09-03 DIAGNOSIS — I10 Essential (primary) hypertension: Secondary | ICD-10-CM

## 2018-09-03 LAB — BASIC METABOLIC PANEL
BUN / CREAT RATIO: 12 (ref 10–24)
BUN: 16 mg/dL (ref 8–27)
CALCIUM: 10.4 mg/dL — AB (ref 8.6–10.2)
CHLORIDE: 102 mmol/L (ref 96–106)
CO2: 26 mmol/L (ref 20–29)
CREATININE: 1.34 mg/dL — AB (ref 0.76–1.27)
GFR calc Af Amer: 61 mL/min/{1.73_m2} (ref >59)
GFR calc non Af Amer: 53 mL/min/{1.73_m2} — ABNORMAL LOW (ref >59)
GLUCOSE: 85 mg/dL (ref 65–99)
Potassium: 4.1 mmol/L (ref 3.5–5.2)
Sodium: 143 mmol/L (ref 134–144)

## 2018-09-08 NOTE — Patient Instructions (Signed)
Dale [name]  02/08/1946    Your procedure is scheduled on:  09-16-2018    Report to [facility]  Entrance,  Report to admitting at 10:30 AM    Call this number if you have problems the morning of surgery [phone]     Remember: Do not eat food After Midnight.  May have clear liquid diet from midnight until 6:45 AM day of surgery then nothing by mouth.               BRUSH YOUR TEETH MORNING OF SURGERY AND RINSE YOUR MOUTH OUT, NO CHEWING GUM CANDY OR MINTS.     Take these medicines the morning of surgery with A SIP OF WATER:  Sertraline(zoloft), Metoprolol, Pantoprazole(protonix)                                You may not have any metal on your body including hair pins and              piercings               Do not wear jewelry lotions, powders or perfumes, deodorant              Men may shave face and neck.   Do not bring valuables to the hospital. [place].  Contacts, dentures or bridgework may not be worn into surgery.  Leave suitcase in the car. After surgery it may be brought to your room.     _____________________________________________________________________    CLEAR LIQUID DIET   Foods Allowed                                                                     Foods Excluded  Coffee and tea, regular and decaf                             liquids that you cannot  Plain Jell-O in any flavor                                             see through such as: Fruit ices (not with fruit pulp)                                     milk, soups, orange juice  Iced Popsicles                                    All solid food Carbonated beverages, regular and diet  Cranberry, grape and apple juices Sports drinks like Gatorade Lightly seasoned clear broth or consume(fat free) Sugar, honey syrup  Sample Menu Breakfast                                 Lunch                                     Supper Cranberry juice                    Beef broth                            Chicken broth Jell-O                                     Grape juice                           Apple juice Coffee or tea                        Jell-O                                      Popsicle                                                Coffee or tea                        Coffee or tea  _____________________________________________________________________            [facility] - Preparing for Surgery Before surgery, you can play an important role.  Because skin is not sterile, your skin needs to be as free of germs as possible.  You can reduce the number of germs on your skin by washing with CHG (chlorahexidine gluconate) soap before surgery.  CHG is an antiseptic cleaner which kills germs and bonds with the skin to continue killing germs even after washing. Please DO NOT use if you have an allergy to CHG or antibacterial soaps.  If your skin becomes reddened/irritated stop using the CHG and inform your nurse when you arrive at Short Stay. Do not shave (including legs and underarms) for at least 48 hours prior to the first CHG shower.  You may shave your face/neck. Please follow these instructions carefully:  1.  Shower with CHG Soap the night before surgery and the  morning of Surgery.  2.  If you choose to wash your hair, wash your hair first as usual with your  normal  shampoo.  3.  After you shampoo, rinse your hair and body thoroughly to remove the  shampoo.                            4.  Use CHG as you would any other liquid soap.  You can apply chg  directly  to the skin and wash                       Gently with a scrungie or clean washcloth.  5.  Apply the CHG Soap to your body ONLY FROM THE NECK DOWN.   Do not use on face/ open                           Wound or open sores. Avoid contact with eyes, ears mouth and genitals (private parts).                        Wash face,  Genitals (private parts) with your normal soap.             6.  Wash thoroughly, paying special attention to the area where your surgery  will be performed.  7.  Thoroughly rinse your body with warm water from the neck down.  8.  DO NOT shower/wash with your normal soap after using and rinsing off  the CHG Soap.             9.  Pat yourself dry with a clean towel.            10.  Wear clean pajamas.            11.  Place clean sheets on your bed the night of your first shower and do not  sleep with pets. Day of Surgery : Do not apply any lotions/deodorants the morning of surgery.  Please wear clean clothes to the hospital/surgery center.  FAILURE TO FOLLOW THESE INSTRUCTIONS MAY RESULT IN THE CANCELLATION OF YOUR SURGERY PATIENT SIGNATURE_________________________________  NURSE SIGNATURE__________________________________  ________________________________________________________________________   Dale [name]  An incentive spirometer is a tool that can help keep your lungs clear and active. This tool measures how well you are filling your lungs with each breath. Taking long deep breaths may help reverse or decrease the chance of developing breathing (pulmonary) problems (especially infection) following:  A long period of time when you are unable to move or be active. BEFORE THE PROCEDURE   If the spirometer includes an indicator to show your best effort, your nurse or respiratory therapist will set it to a desired goal.  If possible, sit up straight or lean slightly forward. Try not to slouch.  Hold the incentive spirometer in an upright position. INSTRUCTIONS FOR USE  1. Sit on the edge of your bed if possible, or sit up as far as you can in bed or on a chair. 2. Hold the incentive spirometer in an upright position. 3. Breathe out normally. 4. Place the mouthpiece in your mouth and seal your lips tightly around it. 5. Breathe in slowly and as deeply as  possible, raising the piston or the ball toward the top of the column. 6. Hold your breath for 3-5 seconds or for as long as possible. Allow the piston or ball to fall to the bottom of the column. 7. Remove the mouthpiece from your mouth and breathe out normally. 8. Rest for a few seconds and repeat Steps 1 through 7 at least 10 times every 1-2 hours when you are awake. Take your time and take a few normal breaths between deep breaths. 9. The spirometer may include an indicator to show your best effort. Use the indicator as a goal to work toward during each repetition. 10. After each  set of 10 deep breaths, practice coughing to be sure your lungs are clear. If you have an incision (the cut made at the time of surgery), support your incision when coughing by placing a pillow or rolled up towels firmly against it. Once you are able to get out of bed, walk around indoors and cough well. You may stop using the incentive spirometer when instructed by your caregiver.  RISKS AND COMPLICATIONS  Take your time so you do not get dizzy or light-headed.  If you are in pain, you may need to take or ask for pain medication before doing incentive spirometry. It is harder to take a deep breath if you are having pain. AFTER USE  Rest and breathe slowly and easily.  It can be helpful to keep track of a log of your progress. Your caregiver can provide you with a simple table to help with this. If you are using the spirometer at home, follow these instructions: [place] IF:   You are having difficultly using the spirometer.  You have trouble using the spirometer as often as instructed.  Your pain medication is not giving enough relief while using the spirometer.  You develop fever of 100.5 F (38.1 C) or higher. SEEK IMMEDIATE MEDICAL CARE IF:   You cough up bloody sputum that had not been present before.  You develop fever of 102 F (38.9 C) or greater.  You develop worsening pain at or near  the incision site. MAKE SURE YOU:   Understand these instructions.  Will watch your condition.  Will get help right away if you are not doing well or get worse. Document Released: 04/01/2007 Document Revised: 02/11/2012 Document Reviewed: 06/02/2007 ExitCare Patient Information 2014 Exit[employer], [place].   ________________________________________________________________________  WHAT IS A BLOOD TRANSFUSION? Blood Transfusion Information  A transfusion is the replacement of blood or some of its parts. Blood is made up of multiple cells which provide different functions.  Red blood cells carry oxygen and are used for blood loss replacement.  White blood cells fight against infection.  Platelets control bleeding.  Plasma helps clot blood.  Other blood products are available for specialized needs, such as hemophilia or other clotting disorders. BEFORE THE TRANSFUSION  Who gives blood for transfusions?   Healthy volunteers who are fully evaluated to make sure their blood is safe. This is blood bank blood. Transfusion therapy is the safest it has ever been in the practice of medicine. Before blood is taken from a donor, a complete history is taken to make sure that person has no history of diseases nor engages in risky social behavior (examples are intravenous drug use or sexual activity with multiple partners). The donor's travel history is screened to minimize risk of transmitting infections, such as malaria. The donated blood is tested for signs of infectious diseases, such as HIV and hepatitis. The blood is then tested to be sure it is compatible with you in order to minimize the chance of a transfusion reaction. If you or a relative donates blood, this is often done in anticipation of surgery and is not appropriate for emergency situations. It takes many days to process the donated blood. RISKS AND COMPLICATIONS Although transfusion therapy is very safe and saves many lives, the main  dangers of transfusion include:   Getting an infectious disease.  Developing a transfusion reaction. This is an allergic reaction to something in the blood you were given. Every precaution is taken to prevent this. The decision to have  a blood transfusion has been considered carefully by your caregiver before blood is given. Blood is not given unless the benefits outweigh the risks. AFTER THE TRANSFUSION  Right after receiving a blood transfusion, you will usually feel much better and more energetic. This is especially true if your red blood cells have gotten low (anemic). The transfusion raises the level of the red blood cells which carry oxygen, and this usually causes an energy increase.  The nurse administering the transfusion will monitor you carefully for complications. HOME CARE INSTRUCTIONS  No special instructions are needed after a transfusion. You may find your energy is better. Speak with your caregiver about any limitations on activity for underlying diseases you may have. SEEK MEDICAL CARE IF:   Your condition is not improving after your transfusion.  You develop redness or irritation at the intravenous (IV) site. SEEK IMMEDIATE MEDICAL CARE IF:  Any of the following symptoms occur over the next 12 hours:  Shaking chills.  You have a temperature by mouth above 102 F (38.9 C), not controlled by medicine.  Chest, back, or muscle pain.  People around you feel you are not acting correctly or are confused.  Shortness of breath or difficulty breathing.  Dizziness and fainting.  You get a rash or develop hives.  You have a decrease in urine output.  Your urine turns a dark color or changes to pink, red, or brown. Any of the following symptoms occur over the next 10 days:  You have a temperature by mouth above 102 F (38.9 C), not controlled by medicine.  Shortness of breath.  Weakness after normal activity.  The white part of the eye turns yellow  (jaundice).  You have a decrease in the amount of urine or are urinating less often.  Your urine turns a dark color or changes to pink, red, or brown. Document Released: 11/16/2000 Document Revised: 02/11/2012 Document Reviewed: 07/05/2008 [facility] Patient Information 2014 [place], [place].  _______________________________________________________________________

## 2018-09-09 ENCOUNTER — Encounter (HOSPITAL_COMMUNITY): Payer: Self-pay

## 2018-09-09 ENCOUNTER — Other Ambulatory Visit: Payer: Self-pay

## 2018-09-09 ENCOUNTER — Encounter (HOSPITAL_COMMUNITY)
Admission: RE | Admit: 2018-09-09 | Discharge: 2018-09-09 | Disposition: A | Discharge status: Home / Self Care | Payer: Medicare Other | Source: Ambulatory Visit | Attending: Orthopedic Surgery | Admitting: Orthopedic Surgery

## 2018-09-09 DIAGNOSIS — Z01818 Encounter for other preprocedural examination: Secondary | ICD-10-CM | POA: Diagnosis not present

## 2018-09-09 DIAGNOSIS — M1712 Unilateral primary osteoarthritis, left knee: Secondary | ICD-10-CM | POA: Insufficient documentation

## 2018-09-09 HISTORY — DX: Personal history of malignant neoplasm, unspecified: Z85.9

## 2018-09-09 HISTORY — DX: Personal history of adenomatous and serrated colon polyps: Z86.0101

## 2018-09-09 HISTORY — DX: Other specified postprocedural states: Z98.890

## 2018-09-09 HISTORY — DX: Testicular hypofunction: E29.1

## 2018-09-09 HISTORY — DX: Personal history of other malignant neoplasm of skin: Z98.890

## 2018-09-09 HISTORY — DX: Cardiac murmur, unspecified: R01.1

## 2018-09-09 HISTORY — DX: Unspecified osteoarthritis, unspecified site: M19.90

## 2018-09-09 HISTORY — DX: Personal history of colonic polyps: Z86.010

## 2018-09-09 HISTORY — DX: Benign prostatic hyperplasia without lower urinary tract symptoms: N40.0

## 2018-09-09 HISTORY — DX: Personal history of other malignant neoplasm of skin: Z85.828

## 2018-09-09 LAB — CBC
HEMATOCRIT: 49.5 % (ref 39.0–52.0)
Hemoglobin: 17.1 g/dL — ABNORMAL HIGH (ref 13.0–17.0)
MCH: 33.3 pg (ref 26.0–34.0)
MCHC: 34.5 g/dL (ref 30.0–36.0)
MCV: 96.3 fL (ref 80.0–100.0)
PLATELETS: 191 10*3/uL (ref 150–400)
RBC: 5.14 MIL/uL (ref 4.22–5.81)
RDW: 13.2 % (ref 11.5–15.5)
WBC: 7.2 10*3/uL (ref 4.0–10.5)
nRBC: 0 % (ref 0.0–0.2)

## 2018-09-09 LAB — SURGICAL PCR SCREEN
MRSA, PCR: NEGATIVE
Staphylococcus aureus: NEGATIVE

## 2018-09-09 LAB — ABO/RH: ABO/RH(D): A POS

## 2018-09-09 NOTE — Progress Notes (Signed)
Surgical clearance from pt pcp, dr c. Melinda Crutch dated 09-05-2018  with chart. Telephone surgical cardiologist clearance, dr h. Smith dated 09-02-2018 in epic. [place] cardiology note , dr h. Tamala Julian, dated 08-21-2018 in epic. EKG dated 08-21-2018 in epic. BMP result dated 09-03-2018 in epic.

## 2018-09-15 NOTE — H&P (Signed)
TOTAL KNEE ADMISSION H&P  Patient is being admitted for left total knee arthroplasty.  Subjective:  Chief Complaint:  Left knee primary OA / pain  HPI: Nawaf Strange Gorder, 72 y.o. male, has a history of pain and functional disability in the left knee due to arthritis and has failed non-surgical conservative treatments for greater than 12 weeks to include NSAID's and/or analgesics, corticosteriod injections and activity modification.  Onset of symptoms was gradual, starting >10 years ago with gradually worsening course since that time. The patient noted prior procedures on the knee to include  arthroscopy and open menisectomy on the left knee(s).  Patient currently rates pain in the left knee(s) at 6 out of 10 with activity. Patient has worsening of pain with activity and weight bearing, pain that interferes with activities of daily living, pain with passive range of motion, crepitus and joint swelling.  Patient has evidence of periarticular osteophytes and joint space narrowing by imaging studies.  There is no active infection.  Risks, benefits and expectations were discussed with the patient.  Risks including but not limited to the risk of anesthesia, blood clots, nerve damage, blood vessel damage, failure of the prosthesis, infection and up to and including death.  Patient understand the risks, benefits and expectations and wishes to proceed with surgery.   PCP: [name], MD  D/C Plans:       Home   Post-op Meds:       No Rx given   Tranexamic Acid:      To be given - IV   Decadron:      Is to be given  FYI:      ASA  [place]  DME:   Pt already has equipment   PT:   OPPT Rx given    Patient Active Problem List   Diagnosis Date Noted  . DDD (degenerative disc disease), lumbosacral 07/10/2017  . Unilateral primary osteoarthritis, left knee 02/20/2017  . Benign neoplasm of colon 03/02/2014  . Premature ventricular contractions 01/04/2014  . Hypercholesteremia   . GERD  (gastroesophageal reflux disease)   . HTN (hypertension)   . ED (erectile dysfunction)   . Allergic rhinitis   . Exertional angina (HCC) 05/01/2013    Class: Acute  . Abnormal nuclear stress test 05/01/2013    Class: Acute   Past Medical History:  Diagnosis Date  . Allergic rhinitis   . BPH (benign prostatic hyperplasia)   . ED (erectile dysfunction)   . Exertional angina [facility])    cardiologist-  [name]---  04-21-2013 abnormal myoview with epicardial coronaries ;  per cardiac cath 05-01-2013 no sig. obstructive CAD (LAD calcification, diffuse plaqueing) ;  evidence dRCA and LV branch vasoconstriction  . GERD (gastroesophageal reflux disease)   . History of adenomatous polyp of colon   . History of basal cell carcinoma (BCC) excision   . History of squamous cell carcinoma excision    09/ 27/ 2019  right lower leg   . HTN (hypertension)   . Hypercholesteremia   . Hypogonadism in male   . OA (osteoarthritis)    knees, lumbar, fingers  . Seasonal allergies   . Skin cancer, basal cell   . Systolic murmur     Past Surgical History:  Procedure Laterality Date  . APPENDECTOMY  age 82  . CARDIAC CATHETERIZATION    . CATARACT EXTRACTION W/ INTRAOCULAR LENS  IMPLANT, BILATERAL  07/2018  . COLONOSCOPY N/A 03/02/2014   Procedure: COLONOSCOPY;  Surgeon: [name],  MD;  Location: [facility] ENDOSCOPY;  Service: Endoscopy;  Laterality: N/A;  . FINGER SURGERY     11-14-2004 right long finger;  12-19-2007 left index finger (both done by dr sypher)  . HOT HEMOSTASIS N/A 03/02/2014   Procedure: HOT HEMOSTASIS (ARGON PLASMA COAGULATION/BICAP);  Surgeon: [name], MD;  Location: [facility];  Service: Endoscopy;  Laterality: N/A;  . INGUINAL HERNIA REPAIR Bilateral left , early 2000s;  right 12/ 2003  . KNEE ARTHROSCOPY Bilateral right- last one 1996;  left , last one ,yrs ago  . KNEE SURGERY Left 1972   open  . LEFT HEART CATHETERIZATION WITH CORONARY ANGIOGRAM N/A 05/01/2013    Procedure: LEFT HEART CATHETERIZATION WITH CORONARY ANGIOGRAM;  Surgeon: [name], MD;  Location: [facility] CATH LAB;  Service: Cardiovascular;  Laterality: N/A;  . MOHS SURGERY  yrs ago   nose  . RECONSTRUCTION OF NOSE  1968    No current facility-administered medications for this encounter.    Current Outpatient Medications  Medication Sig Dispense Refill Last Dose  . aspirin EC 81 MG tablet Take 81 mg by mouth daily.   09/09/2018 at Unknown time  . atorvastatin (LIPITOR) 10 MG tablet Take 10 mg by mouth at bedtime.   Taking  . Calcium & Magnesium Carbonates (MYLANTA PO) Take 10 mLs by mouth daily as needed (indigestion).    Taking  . Calcium Carbonate Antacid (TUMS E-X PO) Take 1,000 mg by mouth daily as needed (for stomach heartburn).   Taking  . cholecalciferol (VITAMIN D) 1000 units tablet Take 1,000 Units by mouth daily.   09/09/2018 at Unknown time  . diclofenac sodium (VOLTAREN) 1 % GEL Apply 2 g topically daily as needed (for muscle pains).   Taking  . fluticasone (FLONASE) 50 MCG/ACT nasal spray Place 1 spray into the nose daily as needed for rhinitis or allergies.   Taking  . hydrocortisone cream 1 % Apply 1 application topically daily as needed for itching.   2 Taking  . lisinopril (PRINIVIL,ZESTRIL) 20 MG tablet Take 1 tablet (20 mg total) by mouth daily. (Patient taking differently: Take 20 mg by mouth every morning. ) 90 tablet 3   . metaxalone (SKELAXIN) 800 MG tablet Take 800 mg by mouth daily as needed for muscle spasms.     . metoprolol succinate (TOPROL-XL) 50 MG 24 hr tablet TAKE 1 TABLET (50 MG TOTAL) BY MOUTH DAILY. (Patient taking differently: Take 50 mg by mouth every morning. ) 30 tablet 11 Taking  . nitroGLYCERIN (NITROSTAT) 0.4 MG SL tablet Place 0.4 mg under the tongue every 5 (five) minutes as needed for chest pain.   Taking  . pantoprazole (PROTONIX) 40 MG tablet Take 40 mg by mouth as needed.    Taking  . sertraline (ZOLOFT) 50 MG tablet Take 50 mg by mouth  every morning.   0 Taking  . sildenafil (REVATIO) 20 MG tablet Take 40 mg by mouth daily as needed (ED).   99   . testosterone cypionate (DEPOTESTOSTERONE CYPIONATE) 200 MG/ML injection Inject 100 mg into the muscle once a week.   0 Taking  . B-D 3CC LUER-LOK SYR 22GX1" 22G X 1" 3 ML MISC See admin instructions.  0 Taking   Allergies  Allergen Reactions  . Latex Itching    redness  . Penicillins Itching and Rash    fever Has patient had a PCN reaction causing immediate rash, facial/tongue/throat swelling, SOB or lightheadedness with hypotension: No Has patient had a PCN reaction  causing severe rash involving mucus membranes or skin necrosis: No Has patient had a PCN reaction that required hospitalization: No Has patient had a PCN reaction occurring within the last 10 years: No If all of the above answers are "NO", then may proceed with Cephalosporin use.   . Pneumococcal Vaccines Swelling, Rash and Other (See Comments)    Low grade fever    Social History   Tobacco Use  . Smoking status: Never Smoker  . Smokeless tobacco: Never Used  Substance Use Topics  . Alcohol use: No    Family History  Problem Relation Age of Onset  . Heart disease Father   . Hypertension Mother   . Stroke Mother      Review of Systems  Constitutional: Negative.   HENT: Negative.   Eyes: Negative.   Respiratory: Negative.   Cardiovascular: Negative.   Gastrointestinal: Positive for heartburn.  Genitourinary: Positive for frequency.  Musculoskeletal: Positive for back pain and joint pain.  Skin: Negative.   Neurological: Negative.   Endo/Heme/Allergies: Positive for environmental allergies.  Psychiatric/Behavioral: The patient is nervous/anxious.     Objective:  Physical Exam  Constitutional: He is oriented to person, place, and time. He appears well-developed.  HENT:  Head: Normocephalic.  Eyes: Pupils are equal, round, and reactive to light.  Neck: Neck supple. No JVD present. No  tracheal deviation present. No thyromegaly present.  Cardiovascular: Normal rate, regular rhythm and intact distal pulses.  Murmur heard. Respiratory: Effort normal and breath sounds normal. No respiratory distress. He has no wheezes.  GI: Soft. There is no tenderness. There is no guarding.  Musculoskeletal:       Left knee: He exhibits decreased range of motion, swelling and bony tenderness. He exhibits no ecchymosis, no deformity, no laceration and no erythema. Tenderness found.  Lymphadenopathy:    He has no cervical adenopathy.  Neurological: He is alert and oriented to person, place, and time.  Skin: Skin is warm and dry.  Psychiatric: He has a normal mood and affect.      Labs:  Estimated body mass index is 24.79 kg/m as calculated from the following:   Height as of 09/09/18: 5\' 7"  (1.702 m).   Weight as of 09/09/18: 71.8 kg.   Imaging Review Plain radiographs demonstrate severe degenerative joint disease of the left knee(s).  The bone quality appears to be good for age and reported activity level.   Preoperative templating of the joint replacement has been completed, documented, and submitted to the Operating Room personnel in order to optimize intra-operative equipment management.    Patient's anticipated LOS is less than 2 midnights, meeting these requirements: - Lives within 1 hour of care - Has a competent adult at home to recover with post-op recover - NO history of  - Chronic pain requiring opiods  - Diabetes  - Coronary Artery Disease  - Heart failure  - Heart attack  - Stroke  - DVT/VTE  - Cardiac arrhythmia  - Respiratory Failure/COPD  - Renal failure  - Anemia  - Advanced Liver disease        Assessment/Plan:  End stage arthritis, left knee   The patient history, physical examination, clinical judgment of the provider and imaging studies are consistent with end stage degenerative joint disease of the left knee(s) and total knee arthroplasty is  deemed medically necessary. The treatment options including medical management, injection therapy arthroscopy and arthroplasty were discussed at length. The risks and benefits of total knee arthroplasty were presented and  reviewed. The risks due to aseptic loosening, infection, stiffness, patella tracking problems, thromboembolic complications and other imponderables were discussed. The patient acknowledged the explanation, agreed to proceed with the plan and consent was signed. Patient is being admitted for inpatient treatment for surgery, pain control, PT, OT, prophylactic antibiotics, VTE prophylaxis, progressive ambulation and ADL's and discharge planning. The patient is planning to be discharged home.    [place] Malikiah Debarr   PA-C  09/15/2018, 7:39 AM

## 2018-09-16 ENCOUNTER — Encounter (HOSPITAL_COMMUNITY): Payer: Self-pay | Admitting: *Deleted

## 2018-09-16 ENCOUNTER — Other Ambulatory Visit: Payer: Self-pay

## 2018-09-16 ENCOUNTER — Inpatient Hospital Stay (HOSPITAL_COMMUNITY): Payer: Medicare Other | Admitting: Anesthesiology

## 2018-09-16 ENCOUNTER — Encounter (HOSPITAL_COMMUNITY)
Admission: RE | Disposition: A | Discharge status: Home / Self Care | Payer: Self-pay | Source: Ambulatory Visit | Attending: Orthopedic Surgery

## 2018-09-16 ENCOUNTER — Inpatient Hospital Stay (HOSPITAL_COMMUNITY)
Admission: RE | Admit: 2018-09-16 | Discharge: 2018-09-17 | DRG: 470 | Disposition: A | Discharge status: Home / Self Care | Payer: Medicare Other | Source: Ambulatory Visit | Attending: Orthopedic Surgery | Admitting: Orthopedic Surgery

## 2018-09-16 DIAGNOSIS — Z85828 Personal history of other malignant neoplasm of skin: Secondary | ICD-10-CM

## 2018-09-16 DIAGNOSIS — Z7951 Long term (current) use of inhaled steroids: Secondary | ICD-10-CM

## 2018-09-16 DIAGNOSIS — Z7982 Long term (current) use of aspirin: Secondary | ICD-10-CM

## 2018-09-16 DIAGNOSIS — N400 Enlarged prostate without lower urinary tract symptoms: Secondary | ICD-10-CM | POA: Diagnosis present

## 2018-09-16 DIAGNOSIS — E7800 Pure hypercholesterolemia, unspecified: Secondary | ICD-10-CM | POA: Diagnosis present

## 2018-09-16 DIAGNOSIS — Z791 Long term (current) use of non-steroidal anti-inflammatories (NSAID): Secondary | ICD-10-CM

## 2018-09-16 DIAGNOSIS — Z79899 Other long term (current) drug therapy: Secondary | ICD-10-CM

## 2018-09-16 DIAGNOSIS — K219 Gastro-esophageal reflux disease without esophagitis: Secondary | ICD-10-CM | POA: Diagnosis present

## 2018-09-16 DIAGNOSIS — Z96652 Presence of left artificial knee joint: Secondary | ICD-10-CM

## 2018-09-16 DIAGNOSIS — I10 Essential (primary) hypertension: Secondary | ICD-10-CM | POA: Diagnosis present

## 2018-09-16 DIAGNOSIS — M1712 Unilateral primary osteoarthritis, left knee: Principal | ICD-10-CM | POA: Diagnosis present

## 2018-09-16 DIAGNOSIS — Z96659 Presence of unspecified artificial knee joint: Secondary | ICD-10-CM

## 2018-09-16 HISTORY — PX: TOTAL KNEE ARTHROPLASTY: SHX125

## 2018-09-16 LAB — TYPE AND SCREEN
ABO/RH(D): A POS
Antibody Screen: NEGATIVE

## 2018-09-16 SURGERY — ARTHROPLASTY, KNEE, TOTAL
Anesthesia: Spinal | Site: Knee | Laterality: Left

## 2018-09-16 MED ORDER — POLYETHYLENE GLYCOL 3350 17 G PO PACK
17.0000 g | PACK | Freq: Two times a day (BID) | ORAL | Status: DC
  Administered 2018-09-16 – 2018-09-17 (×2): 17 g via ORAL
  Filled 2018-09-16 (×2): qty 1

## 2018-09-16 MED ORDER — BUPIVACAINE HCL (PF) 0.25 % IJ SOLN
INTRAMUSCULAR | Status: DC | PRN
  Administered 2018-09-16: 30 mL

## 2018-09-16 MED ORDER — HYDROCODONE-ACETAMINOPHEN 5-325 MG PO TABS
1.0000 | ORAL_TABLET | ORAL | Status: DC | PRN
  Administered 2018-09-16 – 2018-09-17 (×2): 2 via ORAL
  Filled 2018-09-16 (×2): qty 2

## 2018-09-16 MED ORDER — DEXAMETHASONE SODIUM PHOSPHATE 10 MG/ML IJ SOLN
INTRAMUSCULAR | Status: AC
  Filled 2018-09-16: qty 1

## 2018-09-16 MED ORDER — ASPIRIN 81 MG PO CHEW: 81.0000 mg | CHEWABLE_TABLET | Freq: Two times a day (BID) | ORAL | 0 refills | Status: AC

## 2018-09-16 MED ORDER — SODIUM CHLORIDE 0.9 % IJ SOLN
INTRAMUSCULAR | Status: DC | PRN
  Administered 2018-09-16: 30 mL

## 2018-09-16 MED ORDER — SERTRALINE HCL 50 MG PO TABS
50.0000 mg | ORAL_TABLET | Freq: Every morning | ORAL | Status: DC
  Administered 2018-09-17: 50 mg via ORAL
  Filled 2018-09-16: qty 1

## 2018-09-16 MED ORDER — ATORVASTATIN CALCIUM 10 MG PO TABS
10.0000 mg | ORAL_TABLET | Freq: Every day | ORAL | Status: DC
  Administered 2018-09-16: 10 mg via ORAL
  Filled 2018-09-16: qty 1

## 2018-09-16 MED ORDER — HYDROCODONE-ACETAMINOPHEN 7.5-325 MG PO TABS: 1.0000 | ORAL_TABLET | ORAL | Status: DC | PRN

## 2018-09-16 MED ORDER — ROPIVACAINE HCL 7.5 MG/ML IJ SOLN
INTRAMUSCULAR | Status: DC | PRN
  Administered 2018-09-16: 20 mL via PERINEURAL

## 2018-09-16 MED ORDER — CLONIDINE HCL (ANALGESIA) 100 MCG/ML EP SOLN
EPIDURAL | Status: DC | PRN
  Administered 2018-09-16: 50 ug

## 2018-09-16 MED ORDER — DOCUSATE SODIUM 100 MG PO CAPS
100.0000 mg | ORAL_CAPSULE | Freq: Two times a day (BID) | ORAL | Status: DC
  Administered 2018-09-16 – 2018-09-17 (×2): 100 mg via ORAL
  Filled 2018-09-16 (×2): qty 1

## 2018-09-16 MED ORDER — DOCUSATE SODIUM 100 MG PO CAPS: 100.0000 mg | ORAL_CAPSULE | Freq: Two times a day (BID) | ORAL | 0 refills | Status: DC

## 2018-09-16 MED ORDER — ALUM & MAG HYDROXIDE-SIMETH 200-200-20 MG/5ML PO SUSP: 15.0000 mL | ORAL | Status: DC | PRN

## 2018-09-16 MED ORDER — NITROGLYCERIN 0.4 MG SL SUBL: 0.4000 mg | SUBLINGUAL_TABLET | SUBLINGUAL | Status: DC | PRN

## 2018-09-16 MED ORDER — CELECOXIB 200 MG PO CAPS
200.0000 mg | ORAL_CAPSULE | Freq: Two times a day (BID) | ORAL | Status: DC
  Administered 2018-09-16 – 2018-09-17 (×2): 200 mg via ORAL
  Filled 2018-09-16 (×2): qty 1

## 2018-09-16 MED ORDER — ONDANSETRON HCL 4 MG PO TABS: 4.0000 mg | ORAL_TABLET | Freq: Four times a day (QID) | ORAL | Status: DC | PRN

## 2018-09-16 MED ORDER — PROMETHAZINE HCL 25 MG/ML IJ SOLN: 6.2500 mg | INTRAMUSCULAR | Status: DC | PRN

## 2018-09-16 MED ORDER — 0.9 % SODIUM CHLORIDE (POUR BTL) OPTIME
TOPICAL | Status: DC | PRN
  Administered 2018-09-16: 1000 mL

## 2018-09-16 MED ORDER — ONDANSETRON HCL 4 MG/2ML IJ SOLN
INTRAMUSCULAR | Status: AC
  Filled 2018-09-16: qty 2

## 2018-09-16 MED ORDER — TRANEXAMIC ACID-NACL 1000-0.7 MG/100ML-% IV SOLN
1000.0000 mg | INTRAVENOUS | Status: AC
  Administered 2018-09-16: 1000 mg via INTRAVENOUS

## 2018-09-16 MED ORDER — KETOROLAC TROMETHAMINE 30 MG/ML IJ SOLN
INTRAMUSCULAR | Status: AC
  Filled 2018-09-16: qty 1

## 2018-09-16 MED ORDER — PROPOFOL 10 MG/ML IV BOLUS
INTRAVENOUS | Status: AC
  Filled 2018-09-16: qty 20

## 2018-09-16 MED ORDER — HYDROCODONE-ACETAMINOPHEN 7.5-325 MG PO TABS: 1.0000 | ORAL_TABLET | ORAL | 0 refills | Status: DC | PRN

## 2018-09-16 MED ORDER — LACTATED RINGERS IV SOLN
INTRAVENOUS | Status: DC
  Administered 2018-09-16 (×2): via INTRAVENOUS

## 2018-09-16 MED ORDER — ASPIRIN 81 MG PO CHEW
81.0000 mg | CHEWABLE_TABLET | Freq: Two times a day (BID) | ORAL | Status: DC
  Administered 2018-09-16 – 2018-09-17 (×2): 81 mg via ORAL
  Filled 2018-09-16 (×2): qty 1

## 2018-09-16 MED ORDER — TRANEXAMIC ACID-NACL 1000-0.7 MG/100ML-% IV SOLN
1000.0000 mg | Freq: Once | INTRAVENOUS | Status: AC
  Administered 2018-09-16: 1000 mg via INTRAVENOUS
  Filled 2018-09-16: qty 100

## 2018-09-16 MED ORDER — METHOCARBAMOL 500 MG IVPB - SIMPLE MED
500.0000 mg | Freq: Four times a day (QID) | INTRAVENOUS | Status: DC | PRN
  Filled 2018-09-16: qty 50

## 2018-09-16 MED ORDER — BUPIVACAINE IN DEXTROSE 0.75-8.25 % IT SOLN
INTRATHECAL | Status: DC | PRN
  Administered 2018-09-16: 1.8 mL via INTRATHECAL

## 2018-09-16 MED ORDER — HYDROMORPHONE HCL 1 MG/ML IJ SOLN: 0.2500 mg | INTRAMUSCULAR | Status: DC | PRN

## 2018-09-16 MED ORDER — TRANEXAMIC ACID-NACL 1000-0.7 MG/100ML-% IV SOLN
INTRAVENOUS | Status: AC
  Filled 2018-09-16: qty 100

## 2018-09-16 MED ORDER — METHOCARBAMOL 500 MG PO TABS: 500.0000 mg | ORAL_TABLET | Freq: Four times a day (QID) | ORAL | 0 refills | Status: DC | PRN

## 2018-09-16 MED ORDER — ONDANSETRON HCL 4 MG/2ML IJ SOLN
INTRAMUSCULAR | Status: DC | PRN
  Administered 2018-09-16: 4 mg via INTRAVENOUS

## 2018-09-16 MED ORDER — METOCLOPRAMIDE HCL 5 MG PO TABS: 5.0000 mg | ORAL_TABLET | Freq: Three times a day (TID) | ORAL | Status: DC | PRN

## 2018-09-16 MED ORDER — SODIUM CHLORIDE 0.9 % IJ SOLN
INTRAMUSCULAR | Status: AC
  Filled 2018-09-16: qty 50

## 2018-09-16 MED ORDER — MORPHINE SULFATE (PF) 2 MG/ML IV SOLN: 0.5000 mg | INTRAVENOUS | Status: DC | PRN

## 2018-09-16 MED ORDER — ACETAMINOPHEN 325 MG PO TABS: 325.0000 mg | ORAL_TABLET | Freq: Four times a day (QID) | ORAL | Status: DC | PRN

## 2018-09-16 MED ORDER — CEFAZOLIN SODIUM-DEXTROSE 2-4 GM/100ML-% IV SOLN
2.0000 g | Freq: Four times a day (QID) | INTRAVENOUS | Status: AC
  Administered 2018-09-16 (×2): 2 g via INTRAVENOUS
  Filled 2018-09-16 (×2): qty 100

## 2018-09-16 MED ORDER — ONDANSETRON HCL 4 MG/2ML IJ SOLN: 4.0000 mg | Freq: Four times a day (QID) | INTRAMUSCULAR | Status: DC | PRN

## 2018-09-16 MED ORDER — METOPROLOL SUCCINATE ER 50 MG PO TB24
50.0000 mg | ORAL_TABLET | Freq: Every morning | ORAL | Status: DC
  Administered 2018-09-17: 50 mg via ORAL
  Filled 2018-09-16: qty 1

## 2018-09-16 MED ORDER — CEFAZOLIN SODIUM-DEXTROSE 2-4 GM/100ML-% IV SOLN
INTRAVENOUS | Status: AC
  Filled 2018-09-16: qty 100

## 2018-09-16 MED ORDER — POLYETHYLENE GLYCOL 3350 17 G PO PACK: 17.0000 g | PACK | Freq: Two times a day (BID) | ORAL | 0 refills | Status: DC

## 2018-09-16 MED ORDER — KETOROLAC TROMETHAMINE 30 MG/ML IJ SOLN
INTRAMUSCULAR | Status: DC | PRN
  Administered 2018-09-16: 30 mg via INTRAMUSCULAR

## 2018-09-16 MED ORDER — FERROUS SULFATE 325 (65 FE) MG PO TABS: 325.0000 mg | ORAL_TABLET | Freq: Three times a day (TID) | ORAL | 3 refills | Status: DC

## 2018-09-16 MED ORDER — FERROUS SULFATE 325 (65 FE) MG PO TABS
325.0000 mg | ORAL_TABLET | Freq: Two times a day (BID) | ORAL | Status: DC
  Administered 2018-09-17: 325 mg via ORAL
  Filled 2018-09-16: qty 1

## 2018-09-16 MED ORDER — MIDAZOLAM HCL 2 MG/2ML IJ SOLN
1.0000 mg | INTRAMUSCULAR | Status: DC
  Administered 2018-09-16: 2 mg via INTRAVENOUS

## 2018-09-16 MED ORDER — MENTHOL 3 MG MT LOZG: 1.0000 | LOZENGE | OROMUCOSAL | Status: DC | PRN

## 2018-09-16 MED ORDER — MAGNESIUM CITRATE PO SOLN: 1.0000 | Freq: Once | ORAL | Status: DC | PRN

## 2018-09-16 MED ORDER — BISACODYL 10 MG RE SUPP: 10.0000 mg | Freq: Every day | RECTAL | Status: DC | PRN

## 2018-09-16 MED ORDER — PROPOFOL 500 MG/50ML IV EMUL
INTRAVENOUS | Status: DC | PRN
  Administered 2018-09-16: 100 ug/kg/min via INTRAVENOUS

## 2018-09-16 MED ORDER — DEXAMETHASONE SODIUM PHOSPHATE 10 MG/ML IJ SOLN
10.0000 mg | Freq: Once | INTRAMUSCULAR | Status: AC
  Administered 2018-09-17: 10 mg via INTRAVENOUS
  Filled 2018-09-16: qty 1

## 2018-09-16 MED ORDER — METHOCARBAMOL 500 MG PO TABS
500.0000 mg | ORAL_TABLET | Freq: Four times a day (QID) | ORAL | Status: DC | PRN
  Administered 2018-09-16: 500 mg via ORAL
  Filled 2018-09-16: qty 1

## 2018-09-16 MED ORDER — CEFAZOLIN SODIUM-DEXTROSE 2-4 GM/100ML-% IV SOLN
2.0000 g | INTRAVENOUS | Status: AC
  Administered 2018-09-16: 2 g via INTRAVENOUS

## 2018-09-16 MED ORDER — BUPIVACAINE HCL (PF) 0.25 % IJ SOLN
INTRAMUSCULAR | Status: AC
  Filled 2018-09-16: qty 30

## 2018-09-16 MED ORDER — PANTOPRAZOLE SODIUM 40 MG PO TBEC
40.0000 mg | DELAYED_RELEASE_TABLET | Freq: Every day | ORAL | Status: DC | PRN
  Administered 2018-09-16: 40 mg via ORAL
  Filled 2018-09-16: qty 1

## 2018-09-16 MED ORDER — PHENOL 1.4 % MT LIQD
1.0000 | OROMUCOSAL | Status: DC | PRN
  Filled 2018-09-16: qty 177

## 2018-09-16 MED ORDER — CHLORHEXIDINE GLUCONATE 4 % EX LIQD: 60.0000 mL | Freq: Once | CUTANEOUS | Status: DC

## 2018-09-16 MED ORDER — DIPHENHYDRAMINE HCL 12.5 MG/5ML PO ELIX: 12.5000 mg | ORAL_SOLUTION | ORAL | Status: DC | PRN

## 2018-09-16 MED ORDER — PROPOFOL 10 MG/ML IV BOLUS
INTRAVENOUS | Status: AC
  Filled 2018-09-16: qty 40

## 2018-09-16 MED ORDER — METOCLOPRAMIDE HCL 5 MG/ML IJ SOLN: 5.0000 mg | Freq: Three times a day (TID) | INTRAMUSCULAR | Status: DC | PRN

## 2018-09-16 MED ORDER — MIDAZOLAM HCL 2 MG/2ML IJ SOLN
INTRAMUSCULAR | Status: AC
  Administered 2018-09-16: 2 mg via INTRAVENOUS
  Filled 2018-09-16: qty 2

## 2018-09-16 MED ORDER — DEXAMETHASONE SODIUM PHOSPHATE 10 MG/ML IJ SOLN
10.0000 mg | Freq: Once | INTRAMUSCULAR | Status: AC
  Administered 2018-09-16: 10 mg via INTRAVENOUS

## 2018-09-16 MED ORDER — FENTANYL CITRATE (PF) 100 MCG/2ML IJ SOLN
INTRAMUSCULAR | Status: AC
  Administered 2018-09-16: 100 ug via INTRAVENOUS
  Filled 2018-09-16: qty 2

## 2018-09-16 MED ORDER — SODIUM CHLORIDE 0.9 % IR SOLN
Status: DC | PRN
  Administered 2018-09-16: 1000 mL

## 2018-09-16 MED ORDER — FENTANYL CITRATE (PF) 100 MCG/2ML IJ SOLN
50.0000 ug | INTRAMUSCULAR | Status: DC
  Administered 2018-09-16: 100 ug via INTRAVENOUS

## 2018-09-16 MED ORDER — SODIUM CHLORIDE 0.9 % IV SOLN
INTRAVENOUS | Status: DC
  Administered 2018-09-16 – 2018-09-17 (×2): via INTRAVENOUS

## 2018-09-16 SURGICAL SUPPLY — 59 items
ATTUNE MED ANAT PAT 38 KNEE (Knees) ×2 IMPLANT
ATTUNE MED ANAT PAT 38MM KNEE (Knees) ×1 IMPLANT
ATTUNE PSFEM LTSZ6 NARCEM KNEE (Femur) ×3 IMPLANT
ATTUNE PSRP INSR SZ6 8 KNEE (Insert) ×2 IMPLANT
ATTUNE PSRP INSR SZ6 8MM KNEE (Insert) ×1 IMPLANT
BAG ZIPLOCK 12X15 (MISCELLANEOUS) ×3 IMPLANT
BANDAGE ACE 6X5 VEL STRL LF (GAUZE/BANDAGES/DRESSINGS) IMPLANT
BASE TIBIA ATTUNE KNEE SYS SZ6 (Knees) ×1 IMPLANT
BLADE SAW SGTL 11.0X1.19X90.0M (BLADE) IMPLANT
BLADE SAW SGTL 13.0X1.19X90.0M (BLADE) ×3 IMPLANT
BNDG ELASTIC 6X10 VLCR STRL LF (GAUZE/BANDAGES/DRESSINGS) ×3 IMPLANT
BOWL SMART MIX CTS (DISPOSABLE) ×3 IMPLANT
CEMENT HV SMART SET (Cement) ×6 IMPLANT
COVER SURGICAL LIGHT HANDLE (MISCELLANEOUS) ×3 IMPLANT
COVER WAND RF STERILE (DRAPES) ×3 IMPLANT
CUFF TOURN SGL QUICK 34 (TOURNIQUET CUFF) ×2
CUFF TRNQT CYL 34X4X40X1 (TOURNIQUET CUFF) ×1 IMPLANT
DECANTER SPIKE VIAL GLASS SM (MISCELLANEOUS) ×3 IMPLANT
DERMABOND ADVANCED (GAUZE/BANDAGES/DRESSINGS) ×2
DERMABOND ADVANCED .7 DNX12 (GAUZE/BANDAGES/DRESSINGS) ×1 IMPLANT
DRAPE U-SHAPE 47X51 STRL (DRAPES) ×3 IMPLANT
DRESSING AQUACEL AG SP 3.5X10 (GAUZE/BANDAGES/DRESSINGS) ×1 IMPLANT
DRSG AQUACEL AG SP 3.5X10 (GAUZE/BANDAGES/DRESSINGS) ×3
DURAPREP 26ML APPLICATOR (WOUND CARE) ×6 IMPLANT
ELECT REM PT RETURN 15FT ADLT (MISCELLANEOUS) ×3 IMPLANT
GLOVE BIOGEL M 7.0 STRL (GLOVE) IMPLANT
GLOVE BIOGEL PI IND STRL 7.5 (GLOVE) ×1 IMPLANT
GLOVE BIOGEL PI IND STRL 8.5 (GLOVE) ×1 IMPLANT
GLOVE BIOGEL PI INDICATOR 7.5 (GLOVE) ×2
GLOVE BIOGEL PI INDICATOR 8.5 (GLOVE) ×2
GLOVE ECLIPSE 8.0 STRL XLNG CF (GLOVE) IMPLANT
GLOVE ORTHO TXT STRL SZ7.5 (GLOVE) IMPLANT
GLOVE SURG SS PI 7.5 STRL IVOR (GLOVE) ×6 IMPLANT
GLOVE SURG SS PI 8.0 STRL IVOR (GLOVE) ×3 IMPLANT
GOWN STRL REUS W/TWL 2XL LVL3 (GOWN DISPOSABLE) ×3 IMPLANT
GOWN STRL REUS W/TWL LRG LVL3 (GOWN DISPOSABLE) ×3 IMPLANT
HANDPIECE INTERPULSE COAX TIP (DISPOSABLE) ×2
HOLDER FOLEY CATH W/STRAP (MISCELLANEOUS) ×3 IMPLANT
MANIFOLD NEPTUNE II (INSTRUMENTS) ×3 IMPLANT
NDL SAFETY ECLIPSE 18X1.5 (NEEDLE) IMPLANT
NEEDLE HYPO 18GX1.5 SHARP (NEEDLE)
PACK TOTAL KNEE CUSTOM (KITS) ×3 IMPLANT
PIN FIX SIGMA HP QUICK REL (PIN) ×3 IMPLANT
PIN THREADED HEADED SIGMA (PIN) ×3 IMPLANT
POSITIONER SURGICAL ARM (MISCELLANEOUS) ×3 IMPLANT
SET HNDPC FAN SPRY TIP SCT (DISPOSABLE) ×1 IMPLANT
SET PAD KNEE POSITIONER (MISCELLANEOUS) ×3 IMPLANT
SUT MNCRL AB 4-0 PS2 18 (SUTURE) ×3 IMPLANT
SUT STRATAFIX PDS+ 0 24IN (SUTURE) ×3 IMPLANT
SUT VIC AB 1 CT1 36 (SUTURE) ×3 IMPLANT
SUT VIC AB 2-0 CT1 27 (SUTURE) ×6
SUT VIC AB 2-0 CT1 TAPERPNT 27 (SUTURE) ×3 IMPLANT
SYRINGE 3CC LL L/F (MISCELLANEOUS) ×3 IMPLANT
TIBIA ATTUNE KNEE SYS BASE SZ6 (Knees) ×3 IMPLANT
TRAY FOLEY BAG SILVER LF 16FR (CATHETERS) ×3 IMPLANT
TRAY FOLEY MTR SLVR 16FR STAT (SET/KITS/TRAYS/PACK) IMPLANT
WATER STERILE IRR 1000ML POUR (IV SOLUTION) ×6 IMPLANT
WRAP KNEE MAXI GEL POST OP (GAUZE/BANDAGES/DRESSINGS) ×3 IMPLANT
YANKAUER SUCT BULB TIP 10FT TU (MISCELLANEOUS) ×3 IMPLANT

## 2018-09-16 NOTE — Interval H&P Note (Signed)
History and Physical Interval Note:  09/16/2018 11:21 AM  Dale [name]  has presented today for surgery, with the diagnosis of Left knee osteoarthritis  The various methods of treatment have been discussed with the patient and family. After consideration of risks, benefits and other options for treatment, the patient has consented to  Procedure(s) with comments: LEFT TOTAL KNEE ARTHROPLASTY (Left) - 70 mins as a surgical intervention .  The patient's history has been reviewed, patient examined, no change in status, stable for surgery.  I have reviewed the patient's chart and labs.  Questions were answered to the patient's satisfaction.     [name]

## 2018-09-16 NOTE — Transfer of Care (Signed)
Immediate Anesthesia Transfer of Care Note  Patient: Toddy Boyd Randon  Procedure(s) Performed: LEFT TOTAL KNEE ARTHROPLASTY (Left Knee)  Patient Location: PACU  Anesthesia Type:Regional and Spinal  Level of Consciousness: sedated  Airway & Oxygen Therapy: Patient Spontanous Breathing and Patient connected to face mask oxygen  Post-op Assessment: Report given to RN and Post -op Vital signs reviewed and stable  Post vital signs: Reviewed and stable  Last Vitals:  Vitals Value Taken Time  BP    Temp    Pulse 50 09/16/2018  2:18 PM  Resp 13 09/16/2018  2:18 PM  SpO2 97 % 09/16/2018  2:18 PM  Vitals shown include unvalidated device data.  Last Pain:  Vitals:   09/16/18 1100  TempSrc:   PainSc: 2       Patients Stated Pain Goal: 5 (96/43/83 8184)  Complications: No apparent anesthesia complications

## 2018-09-16 NOTE — Discharge Instructions (Signed)

## 2018-09-16 NOTE — Anesthesia Preprocedure Evaluation (Addendum)
Anesthesia Evaluation  Patient identified by MRN, date of birth, ID band Patient awake    Reviewed: Allergy & Precautions, NPO status , Patient's Chart, lab work & pertinent test results, reviewed documented beta blocker date and time   Airway Mallampati: II  TM Distance: >3 FB Neck ROM: Full    Dental  (+) Dental Advisory Given   Pulmonary neg pulmonary ROS,    breath sounds clear to auscultation       Cardiovascular hypertension, Pt. on medications and Pt. on home beta blockers + Valvular Problems/Murmurs  Rhythm:Regular Rate:Normal  LHC in 2014. Non obstructive CAD. No intervention. Normal LVEF.   Neuro/Psych negative neurological ROS     GI/Hepatic Neg liver ROS, GERD  ,  Endo/Other  negative endocrine ROS  Renal/GU Renal InsufficiencyRenal disease     Musculoskeletal  (+) Arthritis ,   Abdominal   Peds  Hematology negative hematology ROS (+)   Anesthesia Other Findings   Reproductive/Obstetrics                             Lab Results  Component Value Date   WBC 7.2 09/09/2018   HGB 17.1 (H) 09/09/2018   HCT 49.5 09/09/2018   MCV 96.3 09/09/2018   PLT 191 09/09/2018   Lab Results  Component Value Date   CREATININE 1.34 (H) 09/03/2018   BUN 16 09/03/2018   NA 143 09/03/2018   K 4.1 09/03/2018   CL 102 09/03/2018   CO2 26 09/03/2018    Anesthesia Physical Anesthesia Plan  ASA: II  Anesthesia Plan: MAC and Spinal   Post-op Pain Management:  Regional for Post-op pain   Induction: Intravenous  PONV Risk Score and Plan: 1 and Ondansetron, Propofol infusion and Treatment may vary due to age or medical condition  Airway Management Planned: Natural Airway and Simple Face Mask  Additional Equipment:   Intra-op Plan:   Post-operative Plan:   Informed Consent: I have reviewed the patients History and Physical, chart, labs and discussed the procedure including the risks,  benefits and alternatives for the proposed anesthesia with the patient or authorized representative who has indicated his/her understanding and acceptance.     Plan Discussed with: CRNA  Anesthesia Plan Comments:        Anesthesia Quick Evaluation

## 2018-09-16 NOTE — Plan of Care (Signed)
Plan of care 

## 2018-09-16 NOTE — Anesthesia Procedure Notes (Signed)
Anesthesia Regional Block: Adductor canal block   Pre-Anesthetic Checklist: ,, timeout performed, Correct Patient, Correct Site, Correct Laterality, Correct Procedure, Correct Position, site marked, Risks and benefits discussed,  Surgical consent,  Pre-op evaluation,  At surgeon's request and post-op pain management  Laterality: Left  Prep: chloraprep       Needles:  Injection technique: Single-shot  Needle Type: Echogenic Needle     Needle Length: 9cm  Needle Gauge: 21     Additional Needles:   Procedures:,,,, ultrasound used (permanent image in chart),,,,  Narrative:  Start time: 09/16/2018 2:05 PM End time: 09/16/2018 2:10 PM Injection made incrementally with aspirations every 5 mL.  Performed by: Personally  Anesthesiologist: Suzette Battiest, MD

## 2018-09-16 NOTE — Op Note (Signed)
NAME:  Dale [name]                      MEDICAL RECORD NO.:  [id number]                             FACILITY:  [facility]      PHYSICIAN:  Pietro Cassis. Alvan Dame, M.D.  DATE OF BIRTH:  1945-12-06      DATE OF PROCEDURE:  09/16/2018                                     OPERATIVE REPORT         PREOPERATIVE DIAGNOSIS:  Left knee osteoarthritis.      POSTOPERATIVE DIAGNOSIS:  Left knee osteoarthritis.      FINDINGS:  The patient was noted to have complete loss of cartilage and   bone-on-bone arthritis with associated osteophytes in the medial and patellofemoral compartments of   the knee with a significant synovitis and associated effusion.  The patient had failed months of conservative treatment including medications, injection therapy, activity modification.     PROCEDURE:  Right total knee replacement.      COMPONENTS USED:  DePuy Attune rotating platform posterior stabilized knee   system, a size 6N femur, 6 tibia, size 8 mm PS AOX insert, and 38 anatomic patellar   button.      SURGEON:  Pietro Cassis. Alvan Dame, M.D.      ASSISTANT:  Danae Orleans, PA-C.      ANESTHESIA:  Spinal.      SPECIMENS:  None.      COMPLICATION:  None.      DRAINS:  None.  EBL: <100cc      TOURNIQUET TIME:   Total Tourniquet Time Documented: Thigh (Left) - 39 minutes Total: Thigh (Left) - 39 minutes  .      The patient was stable to the recovery room.      INDICATION FOR PROCEDURE:  Dale [name] is a 72 y.o. male patient of   mine.  The patient had been seen, evaluated, and treated for months conservatively in the   office with medication, activity modification, and injections.  The patient had   radiographic changes of bone-on-bone arthritis with endplate sclerosis and osteophytes noted.  Based on the radiographic changes and failed conservative measures, the patient   decided to proceed with definitive treatment, total knee replacement.  Risks of infection, DVT, component failure, need  for revision surgery, neurovascular injury were reviewed in the office setting.  The postop course was reviewed stressing the efforts to maximize post-operative satisfaction and function.  Consent was obtained for benefit of pain   relief.      PROCEDURE IN DETAIL:  The patient was brought to the operative theater.   Once adequate anesthesia, preoperative antibiotics, 2 gm of Ancef,1 gm of Tranexamic Acid, and 10 mg of Decadron administered, the patient was positioned supine with a left thigh tourniquet placed.  The  left lower extremity was prepped and draped in sterile fashion.  A time-   out was performed identifying the patient, planned procedure, and the appropriate extremity.      The left lower extremity was placed in the [facility] leg holder.  The leg was   exsanguinated, tourniquet elevated to 250 mmHg.  A midline incision was   made followed by  median parapatellar arthrotomy.  Following initial   exposure, attention was first directed to the patella.  Precut   measurement was noted to be 25 mm.  I resected down to 14 mm and used a   38 anatomic patellar button to restore patellar height as well as cover the cut surface.      The lug holes were drilled and a metal shim was placed to protect the   patella from retractors and saw blade during the procedure.      At this point, attention was now directed to the femur.  The femoral   canal was opened with a drill, irrigated to try to prevent fat emboli.  An   intramedullary rod was passed at 5 degrees valgus, 9 mm of bone was   resected off the distal femur.  Following this resection, the tibia was   subluxated anteriorly.  Using the extramedullary guide, 2 mm of bone was resected off   the proximal medial tibia.  We confirmed the gap would be   stable medially and laterally with a size 6 spacer block as well as confirmed that the tibial cut was perpendicular in the coronal plane, checking with an alignment rod.      Once this was done, I  sized the femur to be a size 6 in the anterior-   posterior dimension, chose a narrow component based on medial and   lateral dimension.  The size 6 rotation block was then pinned in   position anterior referenced using the C-clamp to set rotation.  The   anterior, posterior, and  chamfer cuts were made without difficulty nor   notching making certain that I was along the anterior cortex to help   with flexion gap stability.      The final box cut was made off the lateral aspect of distal femur.   Laminar spreader was used to open up the posterior aspect of the knee to remove posterior osteophytes.  He was noted to large calcification likely within a popliteal cyst that was extra-articular and not pursued during this procedure     At this point, the tibia was sized to be a size 6.  The size 6 tray was   then pinned in position through the medial third of the tubercle,   drilled, and keel punched.   There was a large posterior medial cyst on the tibia that was debrided and then packed with autologous bone graft from previously resected bone cuts.  I did this with the keel in place so as not to effect placement of final component. Trial reduction was now carried with a 6 femur,  6 tibia, a size 6 up to the 8 mm PS insert, and the 38 anatomic patella botton.  The knee was brought to full extension with good flexion stability with the patella   tracking through the trochlea without application of pressure.  Given   all these findings the trial components removed.  Sclerotic bone was drilled.  Final components were   opened and cement was mixed.  The knee was irrigated with normal saline solution and pulse lavage.  The synovial lining was   then injected with 30 cc of 0.25% Marcaine with epinephrine, 1 cc of Toradol and 30 cc of NS for a total of 61 cc.     Final implants were then cemented onto cleaned and dried cut surfaces of bone with the knee brought to extension with a size 8 mm PS trial  insert.      Once the cement had fully cured, excess cement was removed   throughout the knee.  I confirmed that I was satisfied with the range of   motion and stability, and the final size 8 mm PS AOX insert was chosen.  It was   placed into the knee.      The tourniquet had been let down at 39 minutes.  No significant   hemostasis was required.  The extensor mechanism was then reapproximated using #1 Vicryl and #1 Stratafix sutures with the knee   in flexion.  The   remaining wound was closed with 2-0 Vicryl and running 4-0 Monocryl.   The knee was cleaned, dried, dressed sterilely using Dermabond and   Aquacel dressing.  The patient was then   brought to recovery room in stable condition, tolerating the procedure   well.   Please note that Physician Assistant, Danae Orleans, PA-C was present for the entirety of the case, and was utilized for pre-operative positioning, peri-operative retractor management, general facilitation of the procedure and for primary wound closure at the end of the case.              Pietro Cassis Alvan Dame, M.D.    09/16/2018 1:59 PM

## 2018-09-16 NOTE — Anesthesia Procedure Notes (Signed)
Spinal  Patient location during procedure: OR Start time: 09/16/2018 12:17 PM End time: 09/16/2018 12:09 PM Staffing Resident/CRNA: Lind Covert, CRNA Performed: resident/CRNA  Preanesthetic Checklist Completed: patient identified, site marked, surgical consent, pre-op evaluation, timeout performed, IV checked, risks and benefits discussed and monitors and equipment checked Spinal Block Patient position: sitting Prep: DuraPrep Patient monitoring: cardiac monitor, heart rate, continuous pulse ox and blood pressure Approach: midline Location: L3-4 Injection technique: single-shot Needle Needle type: Pencan  Needle gauge: 24 G Needle length: 10 cm Needle insertion depth: 7 cm Assessment Sensory level: T6 Additional Notes Timeout performed. SAB kit date checked. SAB without difficulty

## 2018-09-16 NOTE — Progress Notes (Signed)
Pt has noted scab below right knee. 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 Ted hose applied.

## 2018-09-16 NOTE — Evaluation (Signed)
Physical Therapy Evaluation Patient Details Name: Dale [name] MRN: [id number] DOB: 1946-11-26 Today's Date: 09/16/2018   History of Present Illness  Dale [name] is a 72 YO male s/p L TKR on 09/16/18. PMH includes lumbosacral DDD, PVCs, GERD, HTN, angina. Past surgical history includes cardiac catheterization, cataracts extraction, knee scope bilaterally.   Clinical Impression   Dale [name] presents with mild L knee pain, difficulty performing mobility tasks, and decreased tolerance for ambulation. Dale [name] to benefit from acute Dale [name] to address deficits. Dale [name] ambulated room distance with RW with min guard assist today. Dale [name] had to limit Dale [name], due to slightly decreased sensation of gluteal region. Dale [name] highly motivated to progress mobility. Dale [name] to continue to follow acutely, and will progress mobility as able.     Follow Up Recommendations Follow surgeon's recommendation for DC plan and follow-up therapies;Supervision for mobility/OOB(OPPT )    Equipment Recommendations  Rolling walker with 5" wheels    Recommendations for Other Services       Precautions / Restrictions Precautions Precautions: Fall Restrictions Weight Bearing Restrictions: No Other Position/Activity Restrictions: WBAT       Mobility  Bed Mobility Overal bed mobility: Needs Assistance Bed Mobility: Supine to Sit     Supine to sit: Min guard;HOB elevated     General bed mobility comments: Min guard for safety, verbal cuing for sequencing to EOB. Dale [name] stated decreased sensation in gluteal region, but strongly motivated to stand.   Transfers Overall transfer level: Needs assistance Equipment used: Rolling walker (2 wheeled) Transfers: Sit to/from Stand Sit to Stand: Min guard         General transfer comment: min guard for safety. Dale [name] stood without difficulty, performed hip extension in standing but still stating his glutes felt like they had decreased sensation but Dale [name] could weight shift L and R and perform hip flexion and extension  in closed chain without difficulty.   Ambulation/Gait Ambulation/Gait assistance: Min guard Gait Distance (Feet): 5 Feet Assistive device: Rolling walker (2 wheeled) Gait Pattern/deviations: Step-to pattern;Decreased stride length Gait velocity: decr    General Gait Details: Dale [name] limited Dale [name]'s ambulation to 5 ft in room with recliner close in case Dale [name] needed to sit, given his high motivation to ambulate and Dale [name] was safe with pre-gait tasks. Veral cuing for sequencing, placement in RW.   Stairs            Wheelchair Mobility    Modified Rankin (Stroke Patients Only)       Balance Overall balance assessment: Mild deficits observed, not formally tested                                           Pertinent Vitals/Pain Pain Assessment: 0-10 Pain Score: 2  Pain Location: L knee  Pain Descriptors / Indicators: Sore Pain Intervention(s): Limited activity within patient's tolerance;Monitored during session;Ice applied;Repositioned    Home Living Family/patient expects to be discharged to:: Private residence Living Arrangements: Spouse/significant other Available Help at Discharge: Family;Available PRN/intermittently Type of Home: House Home Access: Stairs to enter Entrance Stairs-Rails: None Entrance Stairs-Number of Steps: 3 Home Layout: One level Home Equipment: Clinical cytogeneticist - 2 wheels;Cane - single point;Crutches;Walker - 4 wheels      Prior Function Level of Independence: Independent               Hand Dominance   Dominant Hand: Left    Extremity/Trunk Assessment  Upper Extremity Assessment Upper Extremity Assessment: Overall WFL for tasks assessed    Lower Extremity Assessment Lower Extremity Assessment: Overall WFL for tasks assessed;LLE deficits/detail LLE Deficits / Details: suspected post-surgical LLE weakness; able to perform SLR x2, ankle pumps, quad sets  LLE Sensation: decreased light touch(gluteal region, upon sitting EOB  and standing )    Cervical / Trunk Assessment Cervical / Trunk Assessment: Normal  Communication   Communication: No difficulties  Cognition Arousal/Alertness: Awake/alert Behavior During Therapy: WFL for tasks assessed/performed Overall Cognitive Status: Within Functional Limits for tasks assessed                                        General Comments      Exercises Total Joint Exercises Ankle Circles/Pumps: AROM;Both;5 reps;Seated Goniometric ROM: knee AAROM approximately 5-85*, limited by pain    Assessment/Plan    Dale [name] Assessment Patient needs continued Dale [name] services  Dale [name] Problem List Decreased strength;Pain;Decreased range of motion;Decreased activity tolerance;Decreased knowledge of use of DME;Decreased balance;Decreased safety awareness;Decreased mobility       Dale [name] Treatment Interventions DME instruction;Therapeutic activities;Gait training;Therapeutic exercise;Patient/family education;Stair training;Balance training;Functional mobility training    Dale [name] Goals (Current goals can be found in the Care Plan section)  Acute Rehab Dale [name] Goals Patient Stated Goal: none stated  Dale [name] Goal Formulation: With patient Time For Goal Achievement: 09/23/18 Potential to Achieve Goals: Good    Frequency 7X/week   Barriers to discharge        Co-evaluation               AM-PAC Dale [name] "6 Clicks" Daily Activity  Outcome Measure Difficulty turning over in bed (including adjusting bedclothes, sheets and blankets)?: Unable Difficulty moving from lying on back to sitting on the side of the bed? : Unable Difficulty sitting down on and standing up from a chair with arms (e.g., wheelchair, bedside commode, etc,.)?: Unable Help needed moving to and from a bed to chair (including a wheelchair)?: None Help needed walking in hospital room?: A Little Help needed climbing 3-5 steps with a railing? : A Little 6 Click Score: 13    End of Session Equipment Utilized During Treatment:  Gait belt Activity Tolerance: Patient tolerated treatment well;Other (comment)(Dale [name] limited session due to Dale [name]'s slightly decreased gluteal sensation) Patient left: in chair;with chair alarm set;with call bell/phone within reach;with family/visitor present;with SCD's reapplied Nurse Communication: Mobility status Dale [name] Visit Diagnosis: Other abnormalities of gait and mobility (R26.89);Difficulty in walking, not elsewhere classified (R26.2)    Time: 3335-4562 Dale [name] Time Calculation (min) (ACUTE ONLY): 21 min   Charges:   Dale [name] Evaluation $Dale [name] Eval Low Complexity: 1 Low         Dale [name], Dale [name] Acute Rehabilitation Services Pager [phone]  Office [phone]  Kariana Wiles D Lakeva Hollon 09/16/2018, 9:05 PM

## 2018-09-16 NOTE — Anesthesia Postprocedure Evaluation (Signed)
Anesthesia Post Note  Patient: Andriel Omalley Vanecek  Procedure(s) Performed: LEFT TOTAL KNEE ARTHROPLASTY (Left Knee)     Patient location during evaluation: PACU Anesthesia Type: Spinal and MAC Level of consciousness: awake and alert Pain management: pain level controlled Vital Signs Assessment: post-procedure vital signs reviewed and stable Respiratory status: spontaneous breathing and respiratory function stable Cardiovascular status: blood pressure returned to baseline and stable Postop Assessment: spinal receding Anesthetic complications: no    Last Vitals:  Vitals:   09/16/18 1500 09/16/18 1515  BP: (!) 145/71 (!) 141/84  Pulse: (!) 47 (!) 45  Resp: 10 15  Temp:  (!) 36.3 C  SpO2: 97% 98%    Last Pain:  Vitals:   09/16/18 1515  TempSrc:   PainSc: 0-No pain    LLE Motor Response: Purposeful movement (09/16/18 1515) LLE Sensation: Numbness (09/16/18 1515) RLE Motor Response: Purposeful movement (09/16/18 1515) RLE Sensation: Numbness (09/16/18 1515) L Sensory Level: L4-Anterior knee, lower leg (09/16/18 1515) R Sensory Level: L4-Anterior knee, lower leg (09/16/18 1515)  Tiajuana Amass

## 2018-09-17 ENCOUNTER — Ambulatory Visit: Payer: Medicare Other

## 2018-09-17 ENCOUNTER — Encounter (HOSPITAL_COMMUNITY): Payer: Self-pay | Admitting: Orthopedic Surgery

## 2018-09-17 DIAGNOSIS — M1712 Unilateral primary osteoarthritis, left knee: Secondary | ICD-10-CM | POA: Diagnosis present

## 2018-09-17 DIAGNOSIS — Z7951 Long term (current) use of inhaled steroids: Secondary | ICD-10-CM | POA: Diagnosis not present

## 2018-09-17 DIAGNOSIS — Z85828 Personal history of other malignant neoplasm of skin: Secondary | ICD-10-CM | POA: Diagnosis not present

## 2018-09-17 DIAGNOSIS — Z7982 Long term (current) use of aspirin: Secondary | ICD-10-CM | POA: Diagnosis not present

## 2018-09-17 DIAGNOSIS — I10 Essential (primary) hypertension: Secondary | ICD-10-CM | POA: Diagnosis present

## 2018-09-17 DIAGNOSIS — K219 Gastro-esophageal reflux disease without esophagitis: Secondary | ICD-10-CM | POA: Diagnosis present

## 2018-09-17 DIAGNOSIS — Z791 Long term (current) use of non-steroidal anti-inflammatories (NSAID): Secondary | ICD-10-CM | POA: Diagnosis not present

## 2018-09-17 DIAGNOSIS — N400 Enlarged prostate without lower urinary tract symptoms: Secondary | ICD-10-CM | POA: Diagnosis present

## 2018-09-17 DIAGNOSIS — Z79899 Other long term (current) drug therapy: Secondary | ICD-10-CM | POA: Diagnosis not present

## 2018-09-17 DIAGNOSIS — E7800 Pure hypercholesterolemia, unspecified: Secondary | ICD-10-CM | POA: Diagnosis present

## 2018-09-17 LAB — BASIC METABOLIC PANEL
Anion gap: 7 (ref 5–15)
BUN: 20 mg/dL (ref 8–23)
CO2: 26 mmol/L (ref 22–32)
CREATININE: 1.24 mg/dL (ref 0.61–1.24)
Calcium: 9.6 mg/dL (ref 8.9–10.3)
Chloride: 109 mmol/L (ref 98–111)
GFR calc non Af Amer: 57 mL/min — ABNORMAL LOW (ref >60)
GLUCOSE: 150 mg/dL — AB (ref 70–99)
Potassium: 4.1 mmol/L (ref 3.5–5.1)
Sodium: 142 mmol/L (ref 135–145)

## 2018-09-17 LAB — CBC
HEMATOCRIT: 42 % (ref 39.0–52.0)
HEMOGLOBIN: 14.3 g/dL (ref 13.0–17.0)
MCH: 33.3 pg (ref 26.0–34.0)
MCHC: 34 g/dL (ref 30.0–36.0)
MCV: 97.9 fL (ref 80.0–100.0)
Platelets: 161 10*3/uL (ref 150–400)
RBC: 4.29 MIL/uL (ref 4.22–5.81)
RDW: 13.2 % (ref 11.5–15.5)
WBC: 16.4 10*3/uL — AB (ref 4.0–10.5)
nRBC: 0 % (ref 0.0–0.2)

## 2018-09-17 NOTE — Care Management Note (Signed)
Case Management Note  Patient Details  Name: Dale [name] MRN: [id number] Date of Birth: 1946/10/25  Subjective/Objective:   Confirmed plan for OP PT, already arranged.   Action/Plan: Needs a RW, contacted [facility] to deliver to the room. [phone]                Expected Discharge Date:  09/17/18               Expected Discharge Plan:  OP Rehab  In-House Referral:  NA  Discharge planning Services  CM Consult  Post Acute Care Choice:  Durable Medical Equipment Choice offered to:  Patient  DME Arranged:  [name] rolling DME Agency:  [place]:  NA [place] Agency:  NA  Status of Service:  Completed, signed off  If discussed at [place] Choice of Stay Meetings, dates discussed:    Additional Comments:  Guadalupe Maple, RN 09/17/2018, 11:06 AM

## 2018-09-17 NOTE — Progress Notes (Signed)
Physical Therapy Treatment Patient Details Name: Dale [name] MRN: [id number] DOB: 21-Dec-1945 Today's Date: 09/17/2018    History of Present Illness Pt is a 72 YO male s/p L TKR on 09/16/18. PMH includes lumbosacral DDD, PVCs, GERD, HTN, angina. Past surgical history includes cardiac catheterization, cataracts extraction, knee scope bilaterally.     PT Comments    Pt progressing well with mobility and eager for return home.  Reviewed stairs and home therex with pt and spouse.   Follow Up Recommendations  Follow surgeon's recommendation for DC plan and follow-up therapies;Supervision for mobility/OOB     Equipment Recommendations  Rolling walker with 5" wheels    Recommendations for Other Services       Precautions / Restrictions Precautions Precautions: Fall Restrictions Weight Bearing Restrictions: No Other Position/Activity Restrictions: WBAT     Mobility  Bed Mobility Overal bed mobility: Needs Assistance Bed Mobility: Supine to Sit     Supine to sit: Supervision     General bed mobility comments: min cues for safety and sequence  Transfers Overall transfer level: Needs assistance Equipment used: Rolling walker (2 wheeled) Transfers: Sit to/from Stand Sit to Stand: Min guard;Supervision         General transfer comment: cues for LE management and use of UEs to self assist  Ambulation/Gait Ambulation/Gait assistance: Min guard;Supervision Gait Distance (Feet): 160 Feet Assistive device: Rolling walker (2 wheeled) Gait Pattern/deviations: Step-to pattern;Step-through pattern;Decreased step length - right;Decreased step length - left;Shuffle;Trunk flexed     General Gait Details: cues for posture, position from RW and initial sequence   Stairs Stairs: Yes Stairs assistance: Min assist Stair Management: No rails;Step to pattern;Backwards;With walker Number of Stairs: 4 General stair comments: 2 steps twice bkwd with RW and cues for sequence  and foot/RW placement.  Spouse assisting on second attempt   Wheelchair Mobility    Modified Rankin (Stroke Patients Only)       Balance Overall balance assessment: Mild deficits observed, not formally tested                                          Cognition Arousal/Alertness: Awake/alert Behavior During Therapy: WFL for tasks assessed/performed Overall Cognitive Status: Within Functional Limits for tasks assessed                                        Exercises Total Joint Exercises Ankle Circles/Pumps: AROM;Both;Seated;15 reps Quad Sets: AROM;Both;10 reps;Supine Heel Slides: AAROM;Left;15 reps;Supine Straight Leg Raises: AAROM;AROM;Left;15 reps;Supine Goniometric ROM: AAROM L knee -8 - 100    General Comments        Pertinent Vitals/Pain Pain Assessment: 0-10 Pain Score: 3  Pain Location: L knee  Pain Descriptors / Indicators: Sore Pain Intervention(s): Limited activity within patient's tolerance;Monitored during session;Premedicated before session;Ice applied    Home Living                      Prior Function            PT Goals (current goals can now be found in the care plan section) Acute Rehab PT Goals Patient Stated Goal: none stated  PT Goal Formulation: With patient Time For Goal Achievement: 09/23/18 Potential to Achieve Goals: Good Progress towards PT goals: Progressing toward goals    Frequency  7X/week      PT Plan Current plan remains appropriate    Co-evaluation              AM-PAC PT "6 Clicks" Daily Activity  Outcome Measure  Difficulty turning over in bed (including adjusting bedclothes, sheets and blankets)?: A Lot Difficulty moving from lying on back to sitting on the side of the bed? : A Lot Difficulty sitting down on and standing up from a chair with arms (e.g., wheelchair, bedside commode, etc,.)?: A Lot Help needed moving to and from a bed to chair (including a  wheelchair)?: None Help needed walking in hospital room?: A Little Help needed climbing 3-5 steps with a railing? : A Little 6 Click Score: 16    End of Session Equipment Utilized During Treatment: Gait belt Activity Tolerance: Patient tolerated treatment well Patient left: in chair;with call bell/phone within reach;with family/visitor present Nurse Communication: Mobility status PT Visit Diagnosis: Difficulty in walking, not elsewhere classified (R26.2)     Time: 2505-3976 PT Time Calculation (min) (ACUTE ONLY): 45 min  Charges:  $Gait Training: 8-22 mins $Therapeutic Exercise: 8-22 mins $Therapeutic Activity: 8-22 mins                     [place] Pager [phone] Office [phone]     Berlinda Farve 09/17/2018, 2:40 PM

## 2018-09-17 NOTE — Plan of Care (Signed)
Pt. Is stable. Pain management in progress, effective. Plane of care reviewed. Problem: Clinical Measurements: Goal: Will remain free from infection Outcome: Progressing Goal: Respiratory complications will improve Outcome: Progressing Goal: Cardiovascular complication will be avoided Outcome: Progressing   Problem: Activity: Goal: Risk for activity intolerance will decrease Outcome: Progressing   Problem: Nutrition: Goal: Adequate nutrition will be maintained Outcome: Progressing   Problem: Coping: Goal: Level of anxiety will decrease Outcome: Progressing   Problem: Elimination: Goal: Will not experience complications related to bowel motility Outcome: Progressing Goal: Will not experience complications related to urinary retention Outcome: Progressing   Problem: Pain Managment: Goal: General experience of comfort will improve Outcome: Progressing   Problem: Safety: Goal: Ability to remain free from injury will improve Outcome: Progressing   Problem: Skin Integrity: Goal: Will show signs of wound healing Outcome: Progressing

## 2018-09-17 NOTE — Progress Notes (Signed)
     Subjective: 1 Day Post-Op Procedure(s) (LRB): LEFT TOTAL KNEE ARTHROPLASTY (Left)   Patient reports pain as mild, pain controlled. No events throughout the night. States that he is feeling good and ready to start his recovery. PT is already setup for tomorrow.  Discussed HTN and he will start his lisinopril once he gets back home, states that the meds usually help treat his HTN well. Ready to be discharged home.   Patient's anticipated LOS is less than 2 midnights, meeting these requirements: - Lives within 1 hour of care - Has a competent adult at home to recover with post-op recover - NO history of  - Chronic pain requiring opiods  - Diabetes  - Coronary Artery Disease  - Heart failure  - Heart attack  - Stroke  - DVT/VTE  - Cardiac arrhythmia  - Respiratory Failure/COPD  - Renal failure  - Anemia  - Advanced Liver disease     Objective:   VITALS:   Vitals:   09/17/18 0437 09/17/18 0904  BP: (!) 173/75 (!) 177/80  Pulse: (!) 57 63  Resp: 18 16  Temp: 98.3 F (36.8 C) 97.6 F (36.4 C)  SpO2: 96% 97%    Dorsiflexion/Plantar flexion intact Incision: dressing C/D/I No cellulitis present Compartment soft  LABS Recent Labs    09/17/18 0524  HGB 14.3  HCT 42.0  WBC 16.4*  PLT 161    Recent Labs    09/17/18 0524  NA 142  K 4.1  BUN 20  CREATININE 1.24  GLUCOSE 150*     Assessment/Plan: 1 Day Post-Op Procedure(s) (LRB): LEFT TOTAL KNEE ARTHROPLASTY (Left) Foley cath d/c'ed Advance diet Up with therapy D/C IV fluids Discharge home Follow up in 2 weeks at [facility] ([place]). Follow up with OLIN,Michele Judy D in 2 weeks.  Contact information:  [facility]) [address] [place]. Murriel Holwerda   PAC  09/17/2018, 9:16 AM

## 2018-09-18 NOTE — Discharge Summary (Signed)
Physician Discharge Summary  Patient ID: Dale [name] MRN: [id number] DOB/AGE: 1946-01-06 72 y.o.  Admit date: 09/16/2018 Discharge date: 09/17/2018   Procedures:  Procedure(s) (LRB): LEFT TOTAL KNEE ARTHROPLASTY (Left)  Attending Physician:  Dr. [name]   Admission Diagnoses:   Left knee primary OA / pain  Discharge Diagnoses:  Principal Problem:   S/P left TKA Active Problems:   S/P total knee replacement  Past Medical History:  Diagnosis Date  . Allergic rhinitis   . BPH (benign prostatic hyperplasia)   . ED (erectile dysfunction)   . Exertional angina [facility])    cardiologist-  [name]---  04-21-2013 abnormal myoview with epicardial coronaries ;  per cardiac cath 05-01-2013 no sig. obstructive CAD (LAD calcification, diffuse plaqueing) ;  evidence dRCA and LV branch vasoconstriction  . GERD (gastroesophageal reflux disease)   . History of adenomatous polyp of colon   . History of basal cell carcinoma (BCC) excision   . History of squamous cell carcinoma excision    09/ 27/ 2019  right lower leg   . HTN (hypertension)   . Hypercholesteremia   . Hypogonadism in male   . OA (osteoarthritis)    knees, lumbar, fingers  . Seasonal allergies   . Skin cancer, basal cell   . Systolic murmur     HPI:    Dale [name], 72 y.o. male, has a history of pain and functional disability in the left knee due to arthritis and has failed non-surgical conservative treatments for greater than 12 weeks to include NSAID's and/or analgesics, corticosteriod injections and activity modification.  Onset of symptoms was gradual, starting >10 years ago with gradually worsening course since that time. The patient noted prior procedures on the knee to include  arthroscopy and open menisectomy on the left knee(s).  Patient currently rates pain in the left knee(s) at 6 out of 10 with activity. Patient has worsening of pain with activity and weight bearing, pain that interferes  with activities of daily living, pain with passive range of motion, crepitus and joint swelling.  Patient has evidence of periarticular osteophytes and joint space narrowing by imaging studies.  There is no active infection.  Risks, benefits and expectations were discussed with the patient.  Risks including but not limited to the risk of anesthesia, blood clots, nerve damage, blood vessel damage, failure of the prosthesis, infection and up to and including death.  Patient understand the risks, benefits and expectations and wishes to proceed with surgery.  PCP: [name], MD   Discharged Condition: good  Hospital Course:  Patient underwent the above stated procedure on 09/16/2018. Patient tolerated the procedure well and brought to the recovery room in good condition and subsequently to the floor.  POD #1 BP: 177/80 ; Pulse: 63 ; Temp: 97.6 F (36.4 C) ; Resp: 16 Patient reports pain as mild, pain controlled. No events throughout the night. States that he is feeling good and ready to start his recovery. PT is already setup for tomorrow.  Discussed HTN and he will start his lisinopril once he gets back home, states that the meds usually help treat his HTN well. Ready to be discharged home. Dorsiflexion/plantar flexion intact, incision: dressing C/D/I, no cellulitis present and compartment soft.   LABS  Basename    HGB     14.3  HCT     42.0     Discharge Exam: General appearance: alert, cooperative and no distress Extremities: Homans sign is negative, no sign  of DVT, no edema, redness or tenderness in the calves or thighs and no ulcers, gangrene or trophic changes  Disposition: Home with follow up in 2 weeks   Follow-up Information    [name], MD. Schedule an appointment as soon as possible for a visit in 2 weeks.   Specialty:  Orthopedic Surgery Contact information: [address] [place] [address] [phone]           Discharge Instructions     Call MD / Call 911   Complete by:  As directed    If you experience chest pain or shortness of breath, CALL 911 and be transported to the hospital emergency room.  If you develope a fever above 101 F, pus (white drainage) or increased drainage or redness at the wound, or calf pain, call your surgeon's office.   Change dressing   Complete by:  As directed    Maintain surgical dressing until follow up in the clinic. If the edges start to pull up, may reinforce with tape. If the dressing is no longer working, may remove and cover with gauze and tape, but must keep the area dry and clean.  Call with any questions or concerns.   Constipation Prevention   Complete by:  As directed    Drink plenty of fluids.  Prune juice may be helpful.  You may use a stool softener, such as Colace (over the counter) 100 mg twice a day.  Use MiraLax (over the counter) for constipation as needed.   Diet - low sodium heart healthy   Complete by:  As directed    Discharge instructions   Complete by:  As directed    Maintain surgical dressing until follow up in the clinic. If the edges start to pull up, may reinforce with tape. If the dressing is no longer working, may remove and cover with gauze and tape, but must keep the area dry and clean.  Follow up in 2 weeks at [facility]. Call with any questions or concerns.   Increase activity slowly as tolerated   Complete by:  As directed    Weight bearing as tolerated with assist device (walker, cane, etc) as directed, use it as long as suggested by your surgeon or therapist, typically at least 4-6 weeks.   TED hose   Complete by:  As directed    Use stockings (TED hose) for 2 weeks on both leg(s).  You may remove them at night for sleeping.      Allergies as of 09/17/2018      Reactions   Latex Itching   redness   Penicillins Itching, Rash   fever Has patient had a PCN reaction causing immediate rash, facial/tongue/throat swelling, SOB or  lightheadedness with hypotension: No Has patient had a PCN reaction causing severe rash involving mucus membranes or skin necrosis: No Has patient had a PCN reaction that required hospitalization: No Has patient had a PCN reaction occurring within the last 10 years: No If all of the above answers are "NO", then may proceed with Cephalosporin use.   Pneumococcal Vaccines Swelling, Rash, Other (See Comments)   Low grade fever      Medication List    STOP taking these medications   aspirin EC 81 MG tablet Replaced by:  aspirin 81 MG chewable tablet   diclofenac sodium 1 % Gel Commonly known as:  VOLTAREN   metaxalone 800 MG tablet Commonly known as:  SKELAXIN     TAKE these  medications   aspirin 81 MG chewable tablet Chew 1 tablet (81 mg total) by mouth 2 (two) times daily. Take for 4 weeks, then resume regular dose. Replaces:  aspirin EC 81 MG tablet   atorvastatin 10 MG tablet Commonly known as:  LIPITOR Take 10 mg by mouth at bedtime.   B-D 3CC LUER-LOK SYR 22GX1" 22G X 1" 3 ML Misc Generic drug:  SYRINGE-NEEDLE (DISP) 3 ML See admin instructions.   cholecalciferol 1000 units tablet Commonly known as:  VITAMIN D Take 1,000 Units by mouth daily.   docusate sodium 100 MG capsule Commonly known as:  COLACE Take 1 capsule (100 mg total) by mouth 2 (two) times daily.   ferrous sulfate 325 (65 FE) MG tablet Take 1 tablet (325 mg total) by mouth 3 (three) times daily with meals.   fluticasone 50 MCG/ACT nasal spray Commonly known as:  FLONASE Place 1 spray into the nose daily as needed for rhinitis or allergies.   HYDROcodone-acetaminophen 7.5-325 MG tablet Commonly known as:  NORCO Take 1-2 tablets by mouth every 4 (four) hours as needed for moderate pain.   HYDROcodone-acetaminophen 7.5-325 MG tablet Commonly known as:  NORCO Take 1-2 tablets by mouth every 4 (four) hours as needed for moderate pain. Start taking on:  09/22/2018   hydrocortisone cream 1 % Apply  1 application topically daily as needed for itching.   lisinopril 20 MG tablet Commonly known as:  PRINIVIL,ZESTRIL Take 1 tablet (20 mg total) by mouth daily. What changed:  when to take this   methocarbamol 500 MG tablet Commonly known as:  ROBAXIN Take 1 tablet (500 mg total) by mouth every 6 (six) hours as needed for muscle spasms.   metoprolol succinate 50 MG 24 hr tablet Commonly known as:  TOPROL-XL TAKE 1 TABLET (50 MG TOTAL) BY MOUTH DAILY. What changed:  when to take this   MYLANTA PO Take 10 mLs by mouth daily as needed (indigestion).   nitroGLYCERIN 0.4 MG SL tablet Commonly known as:  NITROSTAT Place 0.4 mg under the tongue every 5 (five) minutes as needed for chest pain.   pantoprazole 40 MG tablet Commonly known as:  PROTONIX Take 40 mg by mouth as needed.   polyethylene glycol packet Commonly known as:  MIRALAX / GLYCOLAX Take 17 g by mouth 2 (two) times daily.   sertraline 50 MG tablet Commonly known as:  ZOLOFT Take 50 mg by mouth every morning.   sildenafil 20 MG tablet Commonly known as:  REVATIO Take 40 mg by mouth daily as needed (ED).   testosterone cypionate 200 MG/ML injection Commonly known as:  DEPOTESTOSTERONE CYPIONATE Inject 100 mg into the muscle once a week.   TUMS E-X PO Take 1,000 mg by mouth daily as needed (for stomach heartburn).            Discharge Care Instructions  (From admission, onward)         Start     Ordered   09/17/18 0000  Change dressing    Comments:  Maintain surgical dressing until follow up in the clinic. If the edges start to pull up, may reinforce with tape. If the dressing is no longer working, may remove and cover with gauze and tape, but must keep the area dry and clean.  Call with any questions or concerns.   09/17/18 3810           Signed: [place] P[place]. Sayra Frisby   PA-C  09/18/2018, 9:41 AM

## 2018-10-17 ENCOUNTER — Ambulatory Visit: Payer: Medicare Other

## 2018-11-03 ENCOUNTER — Ambulatory Visit (INDEPENDENT_AMBULATORY_CARE_PROVIDER_SITE_OTHER): Payer: Medicare Other | Admitting: Pharmacist

## 2018-11-03 ENCOUNTER — Encounter: Payer: Self-pay | Admitting: Pharmacist

## 2018-11-03 VITALS — BP 134/88 | HR 72

## 2018-11-03 DIAGNOSIS — I10 Essential (primary) hypertension: Secondary | ICD-10-CM | POA: Diagnosis not present

## 2018-11-03 MED ORDER — LISINOPRIL-HYDROCHLOROTHIAZIDE 20-25 MG PO TABS: 1.0000 | ORAL_TABLET | Freq: Every day | ORAL | 1 refills | Status: DC

## 2018-11-03 NOTE — Patient Instructions (Addendum)
Return for a follow up appointment in 4 weeks  Your blood pressure goal is less than 130/80  Check your blood pressure at home daily (if able) and keep record of the readings.  Take your BP meds as follows: STOP lisinopril   START lisinopril HCTZ 20/25mg  once daily in morning  CONTINUE all other medications as prescribed  Bring all of your meds, your BP cuff and your record of home blood pressures to your next appointment.  Exercise as you're able, try to walk approximately 30 minutes per day.  Keep salt intake to a minimum, especially watch canned and prepared boxed foods.  Eat more fresh fruits and vegetables and fewer canned items.  Avoid eating in fast food restaurants.    HOW TO TAKE YOUR BLOOD PRESSURE: . Rest 5 minutes before taking your blood pressure. .  Don't smoke or drink caffeinated beverages for at least 30 minutes before. . Take your blood pressure before (not after) you eat. . Sit comfortably with your back supported and both feet on the floor (don't cross your legs). . Elevate your arm to heart level on a table or a desk. . Use the proper sized cuff. It should fit smoothly and snugly around your bare upper arm. There should be enough room to slip a fingertip under the cuff. The bottom edge of the cuff should be 1 inch above the crease of the elbow. . Ideally, take 3 measurements at one sitting and record the average.

## 2018-11-03 NOTE — Progress Notes (Signed)
Patient ID: Dale [name]                 DOB: [birth year]                      MRN: [id number]     HPI: Dale [name] is a 72 y.o. male patient of Dr. Tamala Julian who presents today for hypertension evaluation. PMH significant for angina pectoris and abnormal myocardial perfusion study with epicardial coronaries and a widely patent, hypertension, hyperlipidemia, and resting bradycardia. At his most recent visit, lisinopril was increased to 20mg  daily with plan to add HCTZ if not at goal today. BMET 2 weeks later was stable.   Patient presents today for blood pressure management in good spirits. His wife is a retired Software engineer who started the pharmacy program for [facility]. He states he has done well with the dose increase of lisinopril. He denies dizziness, chest pain, and shortness of breath. He has been slowly increasing exercise again after his knee surgery. He will have another knee replacement next year. He believes that he has been on HCTZ in the past and done well on it.   He reports that his blood pressure has been 120s/70s [facility] He also believes that his pressure may run a little higher in the office.   Current HTN meds:  Lisinopril 20mg  daily (10am) Metoprolol succinate 50mg  daily (10am)  Previously tried:   BP goal: <130/80  Family History: Heart disease in his father; Hypertension in his mother; Stroke in his mother.  Social History: denies tobacco and alcohol  Diet: He eats most meals from out. Breakfast at home - whole grain cereal. Lunch generally at sit down restaurants - generally tries to choose healthy sides. He does not add salt to food. He drinks water and occasionally diet drinks or tea.   Exercise: Has been walking (20-25 minutes per day currently) and play pickle ball some.   [facility] BP readings: checking [facility] - 120/70s - has not been monitoring at home.   Wt Readings from Last 3 Encounters:  09/16/18 156 lb 8.4 oz (71 kg)  09/09/18  158 lb 4 oz (71.8 kg)  08/21/18 158 lb (71.7 kg)   BP Readings from Last 3 Encounters:  11/03/18 134/88  09/17/18 (!) 177/80  09/09/18 (!) 150/82   Pulse Readings from Last 3 Encounters:  11/03/18 72  09/17/18 63  09/09/18 60    Renal function: CrCl cannot be calculated (Patient's most recent lab result is older than the maximum 21 days allowed.).  Past Medical History:  Diagnosis Date  . Allergic rhinitis   . BPH (benign prostatic hyperplasia)   . ED (erectile dysfunction)   . Exertional angina [facility])    cardiologist-  [name]---  04-21-2013 abnormal myoview with epicardial coronaries ;  per cardiac cath 05-01-2013 no sig. obstructive CAD (LAD calcification, diffuse plaqueing) ;  evidence dRCA and LV branch vasoconstriction  . GERD (gastroesophageal reflux disease)   . History of adenomatous polyp of colon   . History of basal cell carcinoma (BCC) excision   . History of squamous cell carcinoma excision    09/ 27/ 2019  right lower leg   . HTN (hypertension)   . Hypercholesteremia   . Hypogonadism in male   . OA (osteoarthritis)    knees, lumbar, fingers  . Seasonal allergies   . Skin cancer, basal cell   . Systolic murmur     Current  Outpatient Medications on File Prior to Visit  Medication Sig Dispense Refill  . atorvastatin (LIPITOR) 10 MG tablet Take 10 mg by mouth at bedtime.    . B-D 3CC LUER-LOK SYR 22GX1" 22G X 1" 3 ML MISC See admin instructions.  0  . Calcium Carbonate Antacid (TUMS E-X PO) Take 1,000 mg by mouth daily as needed (for stomach heartburn).    . cholecalciferol (VITAMIN D) 1000 units tablet Take 1,000 Units by mouth daily.    Marland Kitchen docusate sodium (COLACE) 100 MG capsule Take 1 capsule (100 mg total) by mouth 2 (two) times daily. (Patient taking differently: Take 100 mg by mouth daily as needed. ) 10 capsule 0  . fluticasone (FLONASE) 50 MCG/ACT nasal spray Place 1 spray into the nose daily as needed for rhinitis or allergies.    Marland Kitchen  HYDROcodone-acetaminophen (NORCO) 7.5-325 MG tablet Take 1-2 tablets by mouth every 4 (four) hours as needed for moderate pain. 60 tablet 0  . hydrocortisone cream 1 % Apply 1 application topically daily as needed for itching.   2  . methocarbamol (ROBAXIN) 500 MG tablet Take 1 tablet (500 mg total) by mouth every 6 (six) hours as needed for muscle spasms. 40 tablet 0  . metoprolol succinate (TOPROL-XL) 50 MG 24 hr tablet TAKE 1 TABLET (50 MG TOTAL) BY MOUTH DAILY. (Patient taking differently: Take 50 mg by mouth every morning. ) 30 tablet 11  . pantoprazole (PROTONIX) 40 MG tablet Take 40 mg by mouth as needed.     . sertraline (ZOLOFT) 50 MG tablet Take 50 mg by mouth every morning.   0  . sildenafil (REVATIO) 20 MG tablet Take 40 mg by mouth daily as needed (ED).   99  . testosterone cypionate (DEPOTESTOSTERONE CYPIONATE) 200 MG/ML injection Inject 100 mg into the muscle once a week.   0  . nitroGLYCERIN (NITROSTAT) 0.4 MG SL tablet Place 0.4 mg under the tongue every 5 (five) minutes as needed for chest pain.     No current facility-administered medications on file prior to visit.     Allergies  Allergen Reactions  . Latex Itching    redness  . Penicillins Itching and Rash    fever Has patient had a PCN reaction causing immediate rash, facial/tongue/throat swelling, SOB or lightheadedness with hypotension: No Has patient had a PCN reaction causing severe rash involving mucus membranes or skin necrosis: No Has patient had a PCN reaction that required hospitalization: No Has patient had a PCN reaction occurring within the last 10 years: No If all of the above answers are "NO", then may proceed with Cephalosporin use.   . Pneumococcal Vaccines Swelling, Rash and Other (See Comments)    Low grade fever    Blood pressure 134/88, pulse 72.   Assessment/Plan: Hypertension: BP today above goal. Will change lisinopril to lisinopril/HCTZ as per Dr. Thompson Caul recommendation. Will start  lisinopril/HCTZ 20/25mg  daily. Advised he monitor at home with cuff and bring log and cuff to office for comparison. Follow up in HTN clinic in 4 weeks to recheck BMET and BP.    Thank you, Lelan Pons. Patterson Hammersmith, [facility]  11/03/2018 2:38 PM

## 2018-12-15 ENCOUNTER — Ambulatory Visit (INDEPENDENT_AMBULATORY_CARE_PROVIDER_SITE_OTHER): Payer: Medicare Other | Admitting: Pharmacist

## 2018-12-15 ENCOUNTER — Other Ambulatory Visit: Payer: Medicare Other | Admitting: *Deleted

## 2018-12-15 VITALS — BP 114/68 | HR 83

## 2018-12-15 DIAGNOSIS — I10 Essential (primary) hypertension: Secondary | ICD-10-CM

## 2018-12-15 NOTE — Progress Notes (Signed)
Patient ID: Dale [name]                 DOB: [birth year]                      MRN: [id number]     HPI: Dale [name] is a 73 y.o. male patient of Dr. Tamala [name] who presents today for hypertension follow-up. PMH significant for angina pectoris and abnormal myocardial perfusion study with epicardial coronaries and a widely patent, hypertension, hyperlipidemia, and resting bradycardia. At his most recent visit, lisinopril 20mg  was changed to lisinopril-HCTZ 20/25mg  daily to lower BP to goal.  Patient presents today for blood pressure management in good spirits. His wife is a retired Software engineer who started the pharmacy program for [facility]. He states he has done fairly well with the addition of HCTZ, felt "draggy" at first but is doing well now. Has been on this medication previously ~8-10 years ago and had to discontinue due to hypotension. He denies dizziness, chest pain, and shortness of breath, headaches, blurry vision. He is planning another knee replacement in late March (tentatively March 31st).  He reports that his blood pressure has been controlled at home. Checking at multiple times throughout the day.   Current HTN meds:  Lisinopril-HCTZ 20/25mg  daily (AM) Metoprolol succinate 50mg  daily (AM)  BP goal: <130/80  Family History: Heart disease in his father; Hypertension in his mother; Stroke in his mother.  Social History: denies tobacco and alcohol  Diet: He eats most meals from out. Breakfast at home - whole grain cereal. Lunch generally at sit down restaurants - generally tries to choose healthy sides. He does not add salt to food. He drinks water and occasionally diet drinks or tea. No changes to diet.  Exercise: Has been walking (20-25 minutes per day currently) and play pickle ball some.   Home BP readings: Brought his home readings and all but a few readings at/below goal. -Avg: ~125/70 -Low: 91/64 (1 occurrence, pt was sick and  dehydrated) -High:145/81   Wt Readings from Last 3 Encounters:  09/16/18 156 lb 8.4 oz (71 kg)  09/09/18 158 lb 4 oz (71.8 kg)  08/21/18 158 lb (71.7 kg)   BP Readings from Last 3 Encounters:  11/03/18 134/88  09/17/18 (!) 177/80  09/09/18 (!) 150/82   Pulse Readings from Last 3 Encounters:  11/03/18 72  09/17/18 63  09/09/18 60    Renal function: CrCl cannot be calculated (Patient's most recent lab result is older than the maximum 21 days allowed.).  Past Medical History:  Diagnosis Date  . Allergic rhinitis   . BPH (benign prostatic hyperplasia)   . ED (erectile dysfunction)   . Exertional angina [facility])    cardiologist-  dr Dale [name]---  04-21-2013 abnormal myoview with epicardial coronaries ;  per cardiac cath 05-01-2013 no sig. obstructive CAD (LAD calcification, diffuse plaqueing) ;  evidence dRCA and LV branch vasoconstriction  . GERD (gastroesophageal reflux disease)   . History of adenomatous polyp of colon   . History of basal cell carcinoma (BCC) excision   . History of squamous cell carcinoma excision    09/ 27/ 2019  right lower leg   . HTN (hypertension)   . Hypercholesteremia   . Hypogonadism in male   . OA (osteoarthritis)    knees, lumbar, fingers  . Seasonal allergies   . Skin cancer, basal cell   . Systolic murmur     Current Outpatient Medications on File Prior  to Visit  Medication Sig Dispense Refill  . atorvastatin (LIPITOR) 10 MG tablet Take 10 mg by mouth at bedtime.    . B-D 3CC LUER-LOK SYR 22GX1" 22G X 1" 3 ML MISC See admin instructions.  0  . Calcium Carbonate Antacid (TUMS E-X PO) Take 1,000 mg by mouth daily as needed (for stomach heartburn).    . cholecalciferol (VITAMIN D) 1000 units tablet Take 1,000 Units by mouth daily.    Marland Kitchen docusate sodium (COLACE) 100 MG capsule Take 1 capsule (100 mg total) by mouth 2 (two) times daily. (Patient taking differently: Take 100 mg by mouth daily as needed. ) 10 capsule 0  . fluticasone (FLONASE)  50 MCG/ACT nasal spray Place 1 spray into the nose daily as needed for rhinitis or allergies.    Marland Kitchen HYDROcodone-acetaminophen (NORCO) 7.5-325 MG tablet Take 1-2 tablets by mouth every 4 (four) hours as needed for moderate pain. 60 tablet 0  . hydrocortisone cream 1 % Apply 1 application topically daily as needed for itching.   2  . lisinopril-hydrochlorothiazide (PRINZIDE,ZESTORETIC) 20-25 MG tablet Take 1 tablet by mouth daily. 90 tablet 1  . methocarbamol (ROBAXIN) 500 MG tablet Take 1 tablet (500 mg total) by mouth every 6 (six) hours as needed for muscle spasms. 40 tablet 0  . metoprolol succinate (TOPROL-XL) 50 MG 24 hr tablet TAKE 1 TABLET (50 MG TOTAL) BY MOUTH DAILY. (Patient taking differently: Take 50 mg by mouth every morning. ) 30 tablet 11  . nitroGLYCERIN (NITROSTAT) 0.4 MG SL tablet Place 0.4 mg under the tongue every 5 (five) minutes as needed for chest pain.    . pantoprazole (PROTONIX) 40 MG tablet Take 40 mg by mouth as needed.     . sertraline (ZOLOFT) 50 MG tablet Take 50 mg by mouth every morning.   0  . sildenafil (REVATIO) 20 MG tablet Take 40 mg by mouth daily as needed (ED).   99  . testosterone cypionate (DEPOTESTOSTERONE CYPIONATE) 200 MG/ML injection Inject 100 mg into the muscle once a week.   0   No current facility-administered medications on file prior to visit.     Allergies  Allergen Reactions  . Latex Itching    redness  . Penicillins Itching and Rash    fever Has patient had a PCN reaction causing immediate rash, facial/tongue/throat swelling, SOB or lightheadedness with hypotension: No Has patient had a PCN reaction causing severe rash involving mucus membranes or skin necrosis: No Has patient had a PCN reaction that required hospitalization: No Has patient had a PCN reaction occurring within the last 10 years: No If all of the above answers are "NO", then may proceed with Cephalosporin use.   . Pneumococcal Vaccines Swelling, Rash and Other (See  Comments)    Low grade fever    There were no vitals taken for this visit.   Assessment/Plan:  1. Hypertension: BP today below goal <130/72mmHg. Checking bmet today with recent addition of HCTZ. Continue lisinopril/HCTZ 20/25mg  daily and Toprol 50mg . Pt aware to monitor BP at home and call with any concerns. Follow-up with pharmacy clinic as needed.  Thank you,  Janae Bridgeman, PharmD PGY1 Pharmacy Resident Phone: [phone] 12/15/2018 1:26 PM

## 2018-12-16 ENCOUNTER — Other Ambulatory Visit: Payer: Self-pay | Admitting: *Deleted

## 2018-12-16 LAB — BASIC METABOLIC PANEL
BUN/Creatinine Ratio: 13 (ref 10–24)
BUN: 18 mg/dL (ref 8–27)
CHLORIDE: 96 mmol/L (ref 96–106)
CO2: 28 mmol/L (ref 20–29)
CREATININE: 1.36 mg/dL — AB (ref 0.76–1.27)
Calcium: 10.7 mg/dL — ABNORMAL HIGH (ref 8.6–10.2)
GFR calc non Af Amer: 52 mL/min/{1.73_m2} — ABNORMAL LOW (ref >59)
GFR, EST AFRICAN AMERICAN: 60 mL/min/{1.73_m2} (ref >59)
GLUCOSE: 88 mg/dL (ref 65–99)
POTASSIUM: 3.6 mmol/L (ref 3.5–5.2)
Sodium: 140 mmol/L (ref 134–144)

## 2018-12-25 ENCOUNTER — Telehealth: Payer: Self-pay | Admitting: *Deleted

## 2018-12-25 NOTE — Telephone Encounter (Signed)
   [facility] Pre-operative Risk Assessment    Request for surgical clearance:  1. What type of surgery is being performed? RIGHT TOTAL KNEE   2. When is this surgery scheduled? 03/03/19   3. What type of clearance is required (medical clearance vs. Pharmacy clearance to hold med vs. Both)? MEDICAL  4. Are there any medications that need to be held prior to surgery and how long?NONE LISTED   5. Practice name and name of physician performing surgery? [facility]; DR. Alvan Dame   6. What is your office phone number [phone]    7.   What is your office fax number [phone]  8.   Anesthesia type (None, local, MAC, general) ? SPINAL   Dale [name] 12/25/2018, 11:09 AM  _________________________________________________________________   (provider comments below)

## 2018-12-26 NOTE — Telephone Encounter (Signed)
   Primary Cardiologist: [name], MD  Chart reviewed and patient contacted by phone today as part of pre-operative protocol coverage. Given past medical history and time since last visit, based on ACC/AHA guidelines, Dale [name] would be at acceptable risk for the planned procedure without further cardiovascular testing.   OK to hold Asprin 5 - 7 days pre op if needed.  I will route this recommendation to the requesting party via Epic fax function and remove from pre-op pool.  Please call with questions.  Kerin Ransom, PA-C 12/26/2018, 2:22 PM

## 2019-03-03 ENCOUNTER — Ambulatory Visit (HOSPITAL_COMMUNITY): Admission: RE | Admit: 2019-03-03 | Payer: Medicare Other | Source: Home / Self Care | Admitting: Orthopedic Surgery

## 2019-03-03 ENCOUNTER — Encounter (HOSPITAL_COMMUNITY): Admission: RE | Payer: Self-pay | Source: Home / Self Care

## 2019-03-03 SURGERY — ARTHROPLASTY, KNEE, TOTAL
Anesthesia: Spinal | Laterality: Right

## 2019-04-15 NOTE — H&P (Signed)
TOTAL KNEE ADMISSION H&P  Patient is being admitted for right total knee arthroplasty.  Subjective:  Chief Complaint:    Right knee primary OA / pain  HPI: Dale [name], 73 y.o. male, has a history of pain and functional disability in the right knee due to arthritis and has failed non-surgical conservative treatments for greater than 12 weeks to include NSAID's and/or analgesics, corticosteriod injections, use of assistive devices and activity modification.  Onset of symptoms was gradual, starting >10 years ago with gradually worsening course since that time. The patient noted prior procedures on the knee to include  arthroplasty on the left knee on 09/16/2018.  Patient currently rates pain in the right knee(s) at 8 out of 10 with activity. Patient has worsening of pain with activity and weight bearing, pain that interferes with activities of daily living, pain with passive range of motion, crepitus and joint swelling.  Patient has evidence of periarticular osteophytes and joint space narrowing by imaging studies.  There is no active infection.  Risks, benefits and expectations were discussed with the patient.  Risks including but not limited to the risk of anesthesia, blood clots, nerve damage, blood vessel damage, failure of the prosthesis, infection and up to and including death.  Patient understand the risks, benefits and expectations and wishes to proceed with surgery.   PCP: [name], MD  D/C Plans:       Home  Post-op Meds:       No Rx given   Tranexamic Acid:      To be given - IV   Decadron:      Is to be given  FYI:      ASA  Norco  DME:   Pt already has equipment   PT:   OPPT Rx given  Pharmacy:  [facility].   Patient Active Problem List   Diagnosis Date Noted  . S/P left TKA 09/16/2018  . 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 DDD (degenerative disc disease), lumbosacral 07/10/2017  . Unilateral primary osteoarthritis, left knee 02/20/2017  .  Benign neoplasm of colon 03/02/2014  . Premature ventricular contractions 01/04/2014  . Hypercholesteremia   . GERD (gastroesophageal reflux disease)   . HTN (hypertension)   . ED (erectile dysfunction)   . Allergic rhinitis   . Exertional angina (HCC) 05/01/2013    Class: Acute  . Abnormal nuclear stress test 05/01/2013    Class: Acute   Past Medical History:  Diagnosis Date  . Allergic rhinitis   . BPH (benign prostatic hyperplasia)   . ED (erectile dysfunction)   . Exertional angina [facility])    cardiologist-  [name]---  04-21-2013 abnormal myoview with epicardial coronaries ;  per cardiac cath 05-01-2013 no sig. obstructive CAD (LAD calcification, diffuse plaqueing) ;  evidence dRCA and LV branch vasoconstriction  . GERD (gastroesophageal reflux disease)   . History of adenomatous polyp of colon   . History of basal cell carcinoma (BCC) excision   . History of squamous cell carcinoma excision    09/ 27/ 2019  right lower leg   . HTN (hypertension)   . Hypercholesteremia   . Hypogonadism in male   . OA (osteoarthritis)    knees, lumbar, fingers  . Seasonal allergies   . Skin cancer, basal cell   . Systolic murmur     Past Surgical History:  Procedure Laterality Date  . APPENDECTOMY  age 75  . CARDIAC CATHETERIZATION    . CATARACT  EXTRACTION W/ INTRAOCULAR LENS  IMPLANT, BILATERAL  07/2018  . COLONOSCOPY N/A 03/02/2014   Procedure: COLONOSCOPY;  Surgeon: [name], MD;  Location: [facility] ENDOSCOPY;  Service: Endoscopy;  Laterality: N/A;  . FINGER SURGERY     11-14-2004 right long finger;  12-19-2007 left index finger (both done by dr sypher)  . HOT HEMOSTASIS N/A 03/02/2014   Procedure: HOT HEMOSTASIS (ARGON PLASMA COAGULATION/BICAP);  Surgeon: [name], MD;  Location: [facility];  Service: Endoscopy;  Laterality: N/A;  . INGUINAL HERNIA REPAIR Bilateral left , early 2000s;  right 12/ 2003  . KNEE ARTHROSCOPY Bilateral right- last one 1996;  left ,  last one ,yrs ago  . KNEE SURGERY Left 1972   open  . LEFT HEART CATHETERIZATION WITH CORONARY ANGIOGRAM N/A 05/01/2013   Procedure: LEFT HEART CATHETERIZATION WITH CORONARY ANGIOGRAM;  Surgeon: [name], MD;  Location: [facility] CATH LAB;  Service: Cardiovascular;  Laterality: N/A;  . MOHS SURGERY  yrs ago   nose  . RECONSTRUCTION OF NOSE  1968  . TOTAL KNEE ARTHROPLASTY Left 09/16/2018   Procedure: LEFT TOTAL KNEE ARTHROPLASTY;  Surgeon: [name], MD;  Location: [facility] ORS;  Service: Orthopedics;  Laterality: Left;  70 mins    No current facility-administered medications for this encounter.    Current Outpatient Medications  Medication Sig Dispense Refill Last Dose  . atorvastatin (LIPITOR) 10 MG tablet Take 10 mg by mouth at bedtime.   Taking  . B-D 3CC LUER-LOK SYR 22GX1" 22G X 1" 3 ML MISC See admin instructions.  0 Taking  . Calcium Carbonate Antacid (TUMS E-X PO) Take 1,000 mg by mouth daily as needed (for stomach heartburn).   Taking  . cholecalciferol (VITAMIN D) 1000 units tablet Take 1,000 Units by mouth daily.   Taking  . docusate sodium (COLACE) 100 MG capsule Take 1 capsule (100 mg total) by mouth 2 (two) times daily. (Patient taking differently: Take 100 mg by mouth daily as needed. ) 10 capsule 0 Taking  . fluticasone (FLONASE) 50 MCG/ACT nasal spray Place 1 spray into the nose daily as needed for rhinitis or allergies.   Taking  . HYDROcodone-acetaminophen (NORCO) 7.5-325 MG tablet Take 1-2 tablets by mouth every 4 (four) hours as needed for moderate pain. 60 tablet 0 Taking  . hydrocortisone cream 1 % Apply 1 application topically daily as needed for itching.   2 Taking  . lisinopril-hydrochlorothiazide (PRINZIDE,ZESTORETIC) 20-25 MG tablet Take 1 tablet by mouth daily. 90 tablet 1   . methocarbamol (ROBAXIN) 500 MG tablet Take 1 tablet (500 mg total) by mouth every 6 (six) hours as needed for muscle spasms. 40 tablet 0 Taking  . metoprolol succinate (TOPROL-XL) 50 MG 24  hr tablet TAKE 1 TABLET (50 MG TOTAL) BY MOUTH DAILY. (Patient taking differently: Take 50 mg by mouth every morning. ) 30 tablet 11 Taking  . nitroGLYCERIN (NITROSTAT) 0.4 MG SL tablet Place 0.4 mg under the tongue every 5 (five) minutes as needed for chest pain.   Not Taking  . pantoprazole (PROTONIX) 40 MG tablet Take 40 mg by mouth as needed.    Taking  . sertraline (ZOLOFT) 50 MG tablet Take 50 mg by mouth every morning.   0 Taking  . sildenafil (REVATIO) 20 MG tablet Take 40 mg by mouth daily as needed (ED).   99 Taking  . testosterone cypionate (DEPOTESTOSTERONE CYPIONATE) 200 MG/ML injection Inject 100 mg into the muscle once a week.   0 Taking  Allergies  Allergen Reactions  . Latex Itching    redness  . Penicillins Itching and Rash    fever Has patient had a PCN reaction causing immediate rash, facial/tongue/throat swelling, SOB or lightheadedness with hypotension: No Has patient had a PCN reaction causing severe rash involving mucus membranes or skin necrosis: No Has patient had a PCN reaction that required hospitalization: No Has patient had a PCN reaction occurring within the last 10 years: No If all of the above answers are "NO", then may proceed with Cephalosporin use.   . Pneumococcal Vaccines Swelling, Rash and Other (See Comments)    Low grade fever    Social History   Tobacco Use  . Smoking status: Never Smoker  . Smokeless tobacco: Never Used  Substance Use Topics  . Alcohol use: No    Family History  Problem Relation Age of Onset  . Heart disease Father   . Hypertension Mother   . Stroke Mother      Review of Systems  Constitutional: Negative.   HENT: Negative.   Eyes: Negative.   Respiratory: Negative.   Cardiovascular: Negative.   Gastrointestinal: Positive for heartburn.  Genitourinary: Negative.   Musculoskeletal: Positive for joint pain.  Skin: Negative.   Neurological: Negative.   Endo/Heme/Allergies: Positive for environmental allergies.   Psychiatric/Behavioral: Negative.     Objective:  Physical Exam  Constitutional: He is oriented to person, place, and time. He appears well-developed.  HENT:  Head: Normocephalic.  Eyes: Pupils are equal, round, and reactive to light.  Neck: Neck supple. No JVD present. No tracheal deviation present. No thyromegaly present.  Cardiovascular: Normal rate, regular rhythm and intact distal pulses.  Respiratory: Effort normal and breath sounds normal. No respiratory distress. He has no wheezes.  GI: Soft. There is no abdominal tenderness. There is no guarding.  Musculoskeletal:     Right knee: He exhibits decreased range of motion, swelling and bony tenderness. He exhibits no ecchymosis, no deformity, no laceration and no erythema. Tenderness found.  Lymphadenopathy:    He has no cervical adenopathy.  Neurological: He is alert and oriented to person, place, and time.  Skin: Skin is warm and dry.  Psychiatric: He has a normal mood and affect.      Labs:  Estimated body mass index is 24.52 kg/m as calculated from the following:   Height as of 09/16/18: 5\' 7"  (1.702 m).   Weight as of 09/16/18: 71 kg.   Imaging Review Plain radiographs demonstrate severe degenerative joint disease of the right knee.  The bone quality appears to be good for age and reported activity level.      Assessment/Plan:  End stage arthritis, right knee   The patient history, physical examination, clinical judgment of the provider and imaging studies are consistent with end stage degenerative joint disease of the right knee and total knee arthroplasty is deemed medically necessary. The treatment options including medical management, injection therapy arthroscopy and arthroplasty were discussed at length. The risks and benefits of total knee arthroplasty were presented and reviewed. The risks due to aseptic loosening, infection, stiffness, patella tracking problems, thromboembolic complications and other  imponderables were discussed. The patient acknowledged the explanation, agreed to proceed with the plan and consent was signed. Patient is being admitted for inpatient treatment for surgery, pain control, PT, OT, prophylactic antibiotics, VTE prophylaxis, progressive ambulation and ADL's and discharge planning. The patient is planning to be discharged home.    Patient's anticipated LOS is less than 2 midnights, meeting  these requirements: - Lives within 1 hour of care - Has a competent adult at home to recover with post-op recover - NO history of  - Chronic pain requiring opiods  - Diabetes  - Coronary Artery Disease  - Heart failure  - Heart attack  - Stroke  - DVT/VTE  - Cardiac arrhythmia  - Respiratory Failure/COPD  - Renal failure  - Anemia  - Advanced Liver disease    [place] Pugh. Payson Evrard   PA-C  04/15/2019, 12:00 PM

## 2019-04-21 NOTE — Patient Instructions (Addendum)
Dale [name]  04/21/2019   Your procedure is scheduled on: 04-28-19    Report to [facility] Main  Entrance    Report to Short Stay at 5:30 AM   YOU NEED TO HAVE A COVID 19 TEST ON 04-24-19 , THIS TEST MUST BE DONE BEFORE SURGERY, COME TO [place] ENTRANCE BETWEEN THE HOURS OF 900 AM AND 300 PM ON YOUR COVID TEST DATE.   Call this number if you have problems the morning of surgery [phone]    Remember: Do not eat food or drink liquids :After Midnight.NO SOLID FOOD AFTER MIDNIGHT THE NIGHT PRIOR TO SURGERY. NOTHING BY MOUTH EXCEPT CLEAR LIQUIDS UNTIL 3 HOURS PRIOR TO SCHEDULED SURGERY. PLEASE FINISH ENSURE DRINK PER SURGEON ORDER 3 HOURS PRIOR TO SCHEDULED SURGERY TIME WHICH NEEDS TO BE COMPLETED AT 4:30 AM.   CLEAR LIQUID DIET   Foods Allowed                                                                     Foods Excluded  Coffee and tea, regular and decaf                             liquids that you cannot  Plain Jell-O in any flavor                                             see through such as: Fruit ices (not with fruit pulp)                                     milk, soups, orange juice  Iced Popsicles                                    All solid food Carbonated beverages, regular and diet                                    Cranberry, grape and apple juices Sports drinks like Gatorade Lightly seasoned clear broth or consume(fat free) Sugar, honey syrup  Sample Menu Breakfast                                Lunch                                     Supper Cranberry juice                    Beef broth  Chicken broth Jell-O                                     Grape juice                           Apple juice Coffee or tea                        Jell-O                                      Popsicle                                                Coffee or tea                        Coffee or  tea  _____________________________________________________________________       BRUSH YOUR TEETH MORNING OF SURGERY AND RINSE YOUR MOUTH OUT, NO CHEWING GUM CANDY OR MINTS.     Take these medicines the morning of surgery with A SIP OF WATER: Colchicine, Metoprolol Succinate (Toprol-XL), Pantoprazole (Protonix), prn , and Sertraline (Zoloft). You can also use and bring your nasal spray                                You may not have any metal on your body including hair pins and              piercings     Do not wear jewelry, cologne, lotions, powders or deodorant                       Men may shave face and neck.   Do not bring valuables to the hospital. [place].  Contacts, dentures or bridgework may not be worn into surgery.      Special Instructions: N/A              Please read over the following fact sheets you were given: _____________________________________________________________________             [facility] - Preparing for Surgery Before surgery, you can play an important role.  Because skin is not sterile, your skin needs to be as free of germs as possible.  You can reduce the number of germs on your skin by washing with CHG (chlorahexidine gluconate) soap before surgery.  CHG is an antiseptic cleaner which kills germs and bonds with the skin to continue killing germs even after washing. Please DO NOT use if you have an allergy to CHG or antibacterial soaps.  If your skin becomes reddened/irritated stop using the CHG and inform your nurse when you arrive at Short Stay. Do not shave (including legs and underarms) for at least 48 hours prior to the first CHG shower.  You may shave your face/neck. Please follow these instructions carefully:  1.  Shower with CHG Soap the night  before surgery and the  morning of Surgery.  2.  If you choose to wash your hair, wash your hair first as usual with your  normal   shampoo.  3.  After you shampoo, rinse your hair and body thoroughly to remove the  shampoo.                           4.  Use CHG as you would any other liquid soap.  You can apply chg directly  to the skin and wash                       Gently with a scrungie or clean washcloth.  5.  Apply the CHG Soap to your body ONLY FROM THE NECK DOWN.   Do not use on face/ open                           Wound or open sores. Avoid contact with eyes, ears mouth and genitals (private parts).                       Wash face,  Genitals (private parts) with your normal soap.             6.  Wash thoroughly, paying special attention to the area where your surgery  will be performed.  7.  Thoroughly rinse your body with warm water from the neck down.  8.  DO NOT shower/wash with your normal soap after using and rinsing off  the CHG Soap.                9.  Pat yourself dry with a clean towel.            10.  Wear clean pajamas.            11.  Place clean sheets on your bed the night of your first shower and do not  sleep with pets. Day of Surgery : Do not apply any lotions/deodorants the morning of surgery.  Please wear clean clothes to the hospital/surgery center.  FAILURE TO FOLLOW THESE INSTRUCTIONS MAY RESULT IN THE CANCELLATION OF YOUR SURGERY PATIENT SIGNATURE_________________________________  NURSE SIGNATURE__________________________________  ________________________________________________________________________   [name]  An incentive spirometer is a tool that can help keep your lungs clear and active. This tool measures how well you are filling your lungs with each breath. Taking long deep breaths may help reverse or decrease the chance of developing breathing (pulmonary) problems (especially infection) following:  A long period of time when you are unable to move or be active. BEFORE THE PROCEDURE   If the spirometer includes an indicator to show your best effort, your nurse or  respiratory therapist will set it to a desired goal.  If possible, sit up straight or lean slightly forward. Try not to slouch.  Hold the incentive spirometer in an upright position. INSTRUCTIONS FOR USE  1. Sit on the edge of your bed if possible, or sit up as far as you can in bed or on a chair. 2. Hold the incentive spirometer in an upright position. 3. Breathe out normally. 4. Place the mouthpiece in your mouth and seal your lips tightly around it. 5. Breathe in slowly and as deeply as possible, raising the piston or the ball toward the top of the column. 6. Hold your breath for 3-5 seconds  or for as long as possible. Allow the piston or ball to fall to the bottom of the column. 7. Remove the mouthpiece from your mouth and breathe out normally. 8. Rest for a few seconds and repeat Steps 1 through 7 at least 10 times every 1-2 hours when you are awake. Take your time and take a few normal breaths between deep breaths. 9. The spirometer may include an indicator to show your best effort. Use the indicator as a goal to work toward during each repetition. 10. After each set of 10 deep breaths, practice coughing to be sure your lungs are clear. If you have an incision (the cut made at the time of surgery), support your incision when coughing by placing a pillow or rolled up towels firmly against it. Once you are able to get out of bed, walk around indoors and cough well. You may stop using the incentive spirometer when instructed by your caregiver.  RISKS AND COMPLICATIONS  Take your time so you do not get dizzy or light-headed.  If you are in pain, you may need to take or ask for pain medication before doing incentive spirometry. It is harder to take a deep breath if you are having pain. AFTER USE  Rest and breathe slowly and easily.  It can be helpful to keep track of a log of your progress. Your caregiver can provide you with a simple table to help with this. If you are using the  spirometer at home, follow these instructions: [place] IF:   You are having difficultly using the spirometer.  You have trouble using the spirometer as often as instructed.  Your pain medication is not giving enough relief while using the spirometer.  You develop fever of 100.5 F (38.1 C) or higher. SEEK IMMEDIATE MEDICAL CARE IF:   You cough up bloody sputum that had not been present before.  You develop fever of 102 F (38.9 C) or greater.  You develop worsening pain at or near the incision site. MAKE SURE YOU:   Understand these instructions.  Will watch your condition.  Will get help right away if you are not doing well or get worse. Document Released: 04/01/2007 Document Revised: 02/11/2012 Document Reviewed: 06/02/2007 Exit[employer] Patient Information 2014 ExitCare, [place].   ________________________________________________________________________  WHAT IS A BLOOD TRANSFUSION? Blood Transfusion Information  A transfusion is the replacement of blood or some of its parts. Blood is made up of multiple cells which provide different functions.  Red blood cells carry oxygen and are used for blood loss replacement.  White blood cells fight against infection.  Platelets control bleeding.  Plasma helps clot blood.  Other blood products are available for specialized needs, such as hemophilia or other clotting disorders. BEFORE THE TRANSFUSION  Who gives blood for transfusions?   Healthy volunteers who are fully evaluated to make sure their blood is safe. This is blood bank blood. Transfusion therapy is the safest it has ever been in the practice of medicine. Before blood is taken from a donor, a complete history is taken to make sure that person has no history of diseases nor engages in risky social behavior (examples are intravenous drug use or sexual activity with multiple partners). The donor's travel history is screened to minimize risk of transmitting  infections, such as malaria. The donated blood is tested for signs of infectious diseases, such as HIV and hepatitis. The blood is then tested to be sure it is compatible with you in order to  minimize the chance of a transfusion reaction. If you or a relative donates blood, this is often done in anticipation of surgery and is not appropriate for emergency situations. It takes many days to process the donated blood. RISKS AND COMPLICATIONS Although transfusion therapy is very safe and saves many lives, the main dangers of transfusion include:   Getting an infectious disease.  Developing a transfusion reaction. This is an allergic reaction to something in the blood you were given. Every precaution is taken to prevent this. The decision to have a blood transfusion has been considered carefully by your caregiver before blood is given. Blood is not given unless the benefits outweigh the risks. AFTER THE TRANSFUSION  Right after receiving a blood transfusion, you will usually feel much better and more energetic. This is especially true if your red blood cells have gotten low (anemic). The transfusion raises the level of the red blood cells which carry oxygen, and this usually causes an energy increase.  The nurse administering the transfusion will monitor you carefully for complications. HOME CARE INSTRUCTIONS  No special instructions are needed after a transfusion. You may find your energy is better. Speak with your caregiver about any limitations on activity for underlying diseases you may have. SEEK MEDICAL CARE IF:   Your condition is not improving after your transfusion.  You develop redness or irritation at the intravenous (IV) site. SEEK IMMEDIATE MEDICAL CARE IF:  Any of the following symptoms occur over the next 12 hours:  Shaking chills.  You have a temperature by mouth above 102 F (38.9 C), not controlled by medicine.  Chest, back, or muscle pain.  People around you feel you are  not acting correctly or are confused.  Shortness of breath or difficulty breathing.  Dizziness and fainting.  You get a rash or develop hives.  You have a decrease in urine output.  Your urine turns a dark color or changes to pink, red, or brown. Any of the following symptoms occur over the next 10 days:  You have a temperature by mouth above 102 F (38.9 C), not controlled by medicine.  Shortness of breath.  Weakness after normal activity.  The white part of the eye turns yellow (jaundice).  You have a decrease in the amount of urine or are urinating less often.  Your urine turns a dark color or changes to pink, red, or brown. Document Released: 11/16/2000 Document Revised: 02/11/2012 Document Reviewed: 07/05/2008 [facility] Patient Information 2014 [place], [place].  _______________________________________________________________________

## 2019-04-21 NOTE — Progress Notes (Signed)
SPOKE W/  PT     SCREENING SYMPTOMS OF COVID 19:   COUGH--No  RUNNY NOSE--- No  SORE THROAT---No  NASAL CONGESTION----No  SNEEZING----No  SHORTNESS OF BREATH---No  DIFFICULTY BREATHING---NO  TEMP >100.0 -----No  UNEXPLAINED BODY ACHES------No  CHILLS --------No   HEADACHES ---------No  LOSS OF SMELL/ TASTE --------No    HAVE YOU OR ANY FAMILY MEMBER TRAVELLED PAST 14 DAYS OUT OF THE   COUNTY---No STATE----No COUNTRY----No  HAVE YOU OR ANY FAMILY MEMBER BEEN EXPOSED TO ANYONE WITH COVID 19? No

## 2019-04-21 NOTE — Progress Notes (Signed)
08-21-18 (EKG)

## 2019-04-22 ENCOUNTER — Encounter (HOSPITAL_COMMUNITY)
Admission: RE | Admit: 2019-04-22 | Discharge: 2019-04-22 | Disposition: A | Discharge status: Home / Self Care | Payer: Medicare Other | Source: Ambulatory Visit | Attending: Orthopedic Surgery | Admitting: Orthopedic Surgery

## 2019-04-22 ENCOUNTER — Other Ambulatory Visit: Payer: Self-pay

## 2019-04-22 ENCOUNTER — Encounter (HOSPITAL_COMMUNITY): Payer: Self-pay

## 2019-04-22 DIAGNOSIS — Z1159 Encounter for screening for other viral diseases: Secondary | ICD-10-CM | POA: Insufficient documentation

## 2019-04-22 DIAGNOSIS — Z01812 Encounter for preprocedural laboratory examination: Secondary | ICD-10-CM | POA: Insufficient documentation

## 2019-04-22 DIAGNOSIS — M1711 Unilateral primary osteoarthritis, right knee: Secondary | ICD-10-CM | POA: Diagnosis not present

## 2019-04-22 LAB — BASIC METABOLIC PANEL
Anion gap: 8 (ref 5–15)
BUN: 25 mg/dL — ABNORMAL HIGH (ref 8–23)
CO2: 28 mmol/L (ref 22–32)
Calcium: 10.9 mg/dL — ABNORMAL HIGH (ref 8.9–10.3)
Chloride: 101 mmol/L (ref 98–111)
Creatinine, Ser: 1.4 mg/dL — ABNORMAL HIGH (ref 0.61–1.24)
GFR calc Af Amer: 58 mL/min — ABNORMAL LOW (ref >60)
GFR calc non Af Amer: 50 mL/min — ABNORMAL LOW (ref >60)
Glucose, Bld: 138 mg/dL — ABNORMAL HIGH (ref 70–99)
Potassium: 3.4 mmol/L — ABNORMAL LOW (ref 3.5–5.1)
Sodium: 137 mmol/L (ref 135–145)

## 2019-04-22 LAB — CBC
HCT: 50.9 % (ref 39.0–52.0)
Hemoglobin: 16.8 g/dL (ref 13.0–17.0)
MCH: 32.3 pg (ref 26.0–34.0)
MCHC: 33 g/dL (ref 30.0–36.0)
MCV: 97.9 fL (ref 80.0–100.0)
Platelets: 186 10*3/uL (ref 150–400)
RBC: 5.2 MIL/uL (ref 4.22–5.81)
RDW: 13.9 % (ref 11.5–15.5)
WBC: 7.4 10*3/uL (ref 4.0–10.5)
nRBC: 0 % (ref 0.0–0.2)

## 2019-04-22 LAB — SURGICAL PCR SCREEN
MRSA, PCR: NEGATIVE
Staphylococcus aureus: NEGATIVE

## 2019-04-23 NOTE — Progress Notes (Signed)
Anesthesia Chart Review   Case:  [id number] Date/Time:  04/28/19 0700   Procedure:  TOTAL KNEE ARTHROPLASTY (Right ) - 70 mins   Anesthesia type:  Spinal   Pre-op diagnosis:  Right knee osteoarthritis   Location:  [facility] ROOM 09 / [facility]   Surgeon:  [name], MD      DISCUSSION: 73 yo never smoker with h/o HTN, hypercholesteremia, BPH, renal insufficiency, GERD, right knee OA scheduled for above procedure 04/28/19 with Dr. [name].   H/o angina pectoris and abnormal myocardial perfusion study in 2014 with subsequent cardiac cath which showed nonobstructive CAD, no intervention.    Cardiac clearance received 12/26/2018.  Per Kerin Ransom, PA-C, "Given past medical history and time since last visit, based on ACC/AHA guidelines, Dale [name] would be at acceptable risk for the planned procedure without further cardiovascular testing.  OK to hold Asprin 5 - 7 days pre op if needed."  Pt can proceed with planned procedure barring acute status change.  VS: BP 115/61 (BP Location: Right Arm)   Pulse 74   Temp 36.8 C (Oral)   Resp 18   Ht 5\' 7"  (1.702 m)   Wt 75 kg   SpO2 95%   BMI 25.90 kg/m   PROVIDERS: [name], MD is PCP  [name], MD is Cardiologist  LABS: Labs reviewed: Acceptable for surgery. (all labs ordered are listed, but only abnormal results are displayed)  Labs Reviewed  BASIC METABOLIC PANEL - Abnormal; Notable for the following components:      Result Value   Potassium 3.4 (*)    Glucose, Bld 138 (*)    BUN 25 (*)    Creatinine, Ser 1.40 (*)    Calcium 10.9 (*)    GFR calc non Af Amer 50 (*)    GFR calc Af Amer 58 (*)    All other components within normal limits  SURGICAL PCR SCREEN  CBC  TYPE AND SCREEN     IMAGES:   EKG: 08/21/18 Rate 59 bpm Sinus bradycardia Minimal voltage criteria for LVH, may be normal variant  CV:  Past Medical History:  Diagnosis Date  . Allergic rhinitis   . BPH (benign prostatic hyperplasia)    . ED (erectile dysfunction)   . Exertional angina [facility])    cardiologist-  [name]---  04-21-2013 abnormal myoview with epicardial coronaries ;  per cardiac cath 05-01-2013 no sig. obstructive CAD (LAD calcification, diffuse plaqueing) ;  evidence dRCA and LV branch vasoconstriction  . GERD (gastroesophageal reflux disease)   . History of adenomatous polyp of colon   . History of basal cell carcinoma (BCC) excision   . History of squamous cell carcinoma excision    09/ 27/ 2019  right lower leg   . HTN (hypertension)   . Hypercholesteremia   . Hypogonadism in male   . OA (osteoarthritis)    knees, lumbar, fingers  . Seasonal allergies   . Skin cancer, basal cell   . Systolic murmur     Past Surgical History:  Procedure Laterality Date  . APPENDECTOMY  age 60  . CARDIAC CATHETERIZATION    . CATARACT EXTRACTION W/ INTRAOCULAR LENS  IMPLANT, BILATERAL  07/2018  . COLONOSCOPY N/A 03/02/2014   Procedure: COLONOSCOPY;  Surgeon: [name], MD;  Location: [facility];  Service: Endoscopy;  Laterality: N/A;  . FINGER SURGERY     11-14-2004 right long finger;  12-19-2007 left index finger (both done by dr sypher)  .  HOT HEMOSTASIS N/A 03/02/2014   Procedure: HOT HEMOSTASIS (ARGON PLASMA COAGULATION/BICAP);  Surgeon: [name], MD;  Location: [facility];  Service: Endoscopy;  Laterality: N/A;  . INGUINAL HERNIA REPAIR Bilateral left , early 2000s;  right 12/ 2003  . KNEE ARTHROSCOPY Bilateral right- last one 1996;  left , last one ,yrs ago  . KNEE SURGERY Left 1972   open  . LEFT HEART CATHETERIZATION WITH CORONARY ANGIOGRAM N/A 05/01/2013   Procedure: LEFT HEART CATHETERIZATION WITH CORONARY ANGIOGRAM;  Surgeon: [name], MD;  Location: [facility] CATH LAB;  Service: Cardiovascular;  Laterality: N/A;  . MOHS SURGERY  yrs ago   nose  . RECONSTRUCTION OF NOSE  1968  . TOTAL KNEE ARTHROPLASTY Left 09/16/2018   Procedure: LEFT TOTAL KNEE ARTHROPLASTY;  Surgeon:  [name], MD;  Location[facility];  Service: Orthopedics;  Laterality: Left;  70 mins    MEDICATIONS: . aspirin EC 81 MG tablet  . atorvastatin (LIPITOR) 10 MG tablet  . B-D 3CC LUER-LOK SYR 22GX1" 22G X 1" 3 ML MISC  . Calcium Carbonate Antacid (TUMS E-X PO)  . cholecalciferol (VITAMIN D) 1000 units tablet  . colchicine 0.6 MG tablet  . docusate sodium (COLACE) 100 MG capsule  . fluticasone (FLONASE) 50 MCG/ACT nasal spray  . hydrocortisone cream 1 %  . lisinopril-hydrochlorothiazide (PRINZIDE,ZESTORETIC) 20-25 MG tablet  . metoprolol succinate (TOPROL-XL) 50 MG 24 hr tablet  . nitroGLYCERIN (NITROSTAT) 0.4 MG SL tablet  . pantoprazole (PROTONIX) 40 MG tablet  . sertraline (ZOLOFT) 50 MG tablet  . sildenafil (REVATIO) 20 MG tablet  . testosterone cypionate (DEPOTESTOSTERONE CYPIONATE) 200 MG/ML injection   No current facility-administered medications for this encounter.     Maia Plan [facility] Pre-Surgical Testing [phone] 04/23/19 12:10 PM

## 2019-04-23 NOTE — Anesthesia Preprocedure Evaluation (Addendum)
Anesthesia Evaluation  Patient identified by MRN, date of birth, ID band Patient awake    Reviewed: Allergy & Precautions, NPO status , Patient's Chart, lab work & pertinent test results  Airway Mallampati: I  TM Distance: >3 FB Neck ROM: Full    Dental no notable dental hx. (+) Teeth Intact, Dental Advisory Given   Pulmonary neg pulmonary ROS,    Pulmonary exam normal breath sounds clear to auscultation       Cardiovascular hypertension, Normal cardiovascular exam Rhythm:Regular Rate:Normal  H/o angina pectoris and abnormal myocardial perfusion study in 2014 with subsequent cardiac cath which showed nonobstructive CAD, no intervention   Neuro/Psych negative neurological ROS  negative psych ROS   GI/Hepatic Neg liver ROS, GERD  Medicated,  Endo/Other  negative endocrine ROS  Renal/GU negative Renal ROS  negative genitourinary   Musculoskeletal  (+) Arthritis ,   Abdominal   Peds  Hematology negative hematology ROS (+)   Anesthesia Other Findings   Reproductive/Obstetrics                           Anesthesia Physical Anesthesia Plan  ASA: III  Anesthesia Plan: Spinal and Regional   Post-op Pain Management:  Regional for Post-op pain   Induction:   PONV Risk Score and Plan: 1 and Treatment may vary due to age or medical condition, Dexamethasone and Ondansetron  Airway Management Planned: Natural Airway  Additional Equipment:   Intra-op Plan:   Post-operative Plan:   Informed Consent: I have reviewed the patients History and Physical, chart, labs and discussed the procedure including the risks, benefits and alternatives for the proposed anesthesia with the patient or authorized representative who has indicated his/her understanding and acceptance.     Dental advisory given  Plan Discussed with: CRNA  Anesthesia Plan Comments:       Anesthesia Quick Evaluation

## 2019-04-24 ENCOUNTER — Other Ambulatory Visit (HOSPITAL_COMMUNITY)
Admission: RE | Admit: 2019-04-24 | Discharge: 2019-04-24 | Disposition: A | Discharge status: Home / Self Care | Payer: Medicare Other | Source: Ambulatory Visit | Attending: Orthopedic Surgery | Admitting: Orthopedic Surgery

## 2019-04-24 ENCOUNTER — Other Ambulatory Visit: Payer: Self-pay

## 2019-04-24 DIAGNOSIS — Z01812 Encounter for preprocedural laboratory examination: Secondary | ICD-10-CM | POA: Diagnosis not present

## 2019-04-25 LAB — NOVEL CORONAVIRUS, NAA (HOSP ORDER, SEND-OUT TO REF LAB; TAT 18-24 HRS): SARS-CoV-2, NAA: NOT DETECTED

## 2019-04-27 NOTE — Progress Notes (Signed)
SPOKE W/  _Patient     SCREENING SYMPTOMS OF COVID 19:   COUGH--no  RUNNY NOSE--- no  SORE THROAT---no  NASAL CONGESTION----no  SNEEZING----no  SHORTNESS OF BREATH---no  DIFFICULTY BREATHING---no  TEMP >100.0 -----no  UNEXPLAINED BODY ACHES------no  CHILLS -------- no  HEADACHES ---------no  LOSS OF SMELL/ TASTE --------no    HAVE YOU OR ANY FAMILY MEMBER TRAVELLED PAST 14 DAYS OUT OF THE   COUNTY---no STATE----no COUNTRY----no  HAVE YOU OR ANY FAMILY MEMBER BEEN EXPOSED TO ANYONE WITH COVID 19? no    

## 2019-04-28 ENCOUNTER — Encounter (HOSPITAL_COMMUNITY)
Admission: RE | Disposition: A | Discharge status: Home / Self Care | Payer: Self-pay | Source: Home / Self Care | Attending: Orthopedic Surgery

## 2019-04-28 ENCOUNTER — Ambulatory Visit (HOSPITAL_COMMUNITY): Payer: Medicare Other | Admitting: Physician Assistant

## 2019-04-28 ENCOUNTER — Encounter (HOSPITAL_COMMUNITY): Payer: Self-pay | Admitting: Emergency Medicine

## 2019-04-28 ENCOUNTER — Observation Stay (HOSPITAL_COMMUNITY)
Admission: RE | Admit: 2019-04-28 | Discharge: 2019-04-29 | Disposition: A | Discharge status: Home / Self Care | Payer: Medicare Other | Attending: Orthopedic Surgery | Admitting: Orthopedic Surgery

## 2019-04-28 ENCOUNTER — Ambulatory Visit (HOSPITAL_COMMUNITY): Payer: Medicare Other | Admitting: Certified Registered"

## 2019-04-28 ENCOUNTER — Other Ambulatory Visit: Payer: Self-pay

## 2019-04-28 DIAGNOSIS — Z85828 Personal history of other malignant neoplasm of skin: Secondary | ICD-10-CM | POA: Diagnosis not present

## 2019-04-28 DIAGNOSIS — Z880 Allergy status to penicillin: Secondary | ICD-10-CM | POA: Diagnosis not present

## 2019-04-28 DIAGNOSIS — I2510 Atherosclerotic heart disease of native coronary artery without angina pectoris: Secondary | ICD-10-CM | POA: Insufficient documentation

## 2019-04-28 DIAGNOSIS — Z9841 Cataract extraction status, right eye: Secondary | ICD-10-CM | POA: Diagnosis not present

## 2019-04-28 DIAGNOSIS — Z86010 Personal history of colonic polyps: Secondary | ICD-10-CM | POA: Diagnosis not present

## 2019-04-28 DIAGNOSIS — Z881 Allergy status to other antibiotic agents status: Secondary | ICD-10-CM | POA: Insufficient documentation

## 2019-04-28 DIAGNOSIS — N400 Enlarged prostate without lower urinary tract symptoms: Secondary | ICD-10-CM | POA: Insufficient documentation

## 2019-04-28 DIAGNOSIS — I10 Essential (primary) hypertension: Secondary | ICD-10-CM | POA: Insufficient documentation

## 2019-04-28 DIAGNOSIS — M1711 Unilateral primary osteoarthritis, right knee: Secondary | ICD-10-CM | POA: Diagnosis present

## 2019-04-28 DIAGNOSIS — Z79899 Other long term (current) drug therapy: Secondary | ICD-10-CM | POA: Diagnosis not present

## 2019-04-28 DIAGNOSIS — Z7951 Long term (current) use of inhaled steroids: Secondary | ICD-10-CM | POA: Diagnosis not present

## 2019-04-28 DIAGNOSIS — M5137 Other intervertebral disc degeneration, lumbosacral region: Principal | ICD-10-CM | POA: Insufficient documentation

## 2019-04-28 DIAGNOSIS — E7800 Pure hypercholesterolemia, unspecified: Secondary | ICD-10-CM | POA: Insufficient documentation

## 2019-04-28 DIAGNOSIS — Z7982 Long term (current) use of aspirin: Secondary | ICD-10-CM | POA: Insufficient documentation

## 2019-04-28 DIAGNOSIS — K219 Gastro-esophageal reflux disease without esophagitis: Secondary | ICD-10-CM | POA: Insufficient documentation

## 2019-04-28 DIAGNOSIS — Z9842 Cataract extraction status, left eye: Secondary | ICD-10-CM | POA: Diagnosis not present

## 2019-04-28 DIAGNOSIS — Z96651 Presence of right artificial knee joint: Secondary | ICD-10-CM

## 2019-04-28 HISTORY — PX: TOTAL KNEE ARTHROPLASTY: SHX125

## 2019-04-28 LAB — TYPE AND SCREEN
ABO/RH(D): A POS
Antibody Screen: NEGATIVE

## 2019-04-28 SURGERY — ARTHROPLASTY, KNEE, TOTAL
Anesthesia: Regional | Laterality: Right

## 2019-04-28 MED ORDER — DOCUSATE SODIUM 100 MG PO CAPS: 100.0000 mg | ORAL_CAPSULE | Freq: Two times a day (BID) | ORAL | 0 refills | Status: DC

## 2019-04-28 MED ORDER — BUPIVACAINE HCL (PF) 0.5 % IJ SOLN: INTRAMUSCULAR | Status: DC | PRN

## 2019-04-28 MED ORDER — SODIUM CHLORIDE (PF) 0.9 % IJ SOLN
INTRAMUSCULAR | Status: AC
  Filled 2019-04-28: qty 50

## 2019-04-28 MED ORDER — EPHEDRINE 5 MG/ML INJ
INTRAVENOUS | Status: AC
  Filled 2019-04-28: qty 10

## 2019-04-28 MED ORDER — DEXAMETHASONE SODIUM PHOSPHATE 10 MG/ML IJ SOLN
10.0000 mg | Freq: Once | INTRAMUSCULAR | Status: AC
  Administered 2019-04-29: 10 mg via INTRAVENOUS
  Filled 2019-04-28: qty 1

## 2019-04-28 MED ORDER — FENTANYL CITRATE (PF) 100 MCG/2ML IJ SOLN
INTRAMUSCULAR | Status: AC
  Filled 2019-04-28: qty 2

## 2019-04-28 MED ORDER — METOCLOPRAMIDE HCL 5 MG/ML IJ SOLN: 5.0000 mg | Freq: Three times a day (TID) | INTRAMUSCULAR | Status: DC | PRN

## 2019-04-28 MED ORDER — HYDROMORPHONE HCL 1 MG/ML IJ SOLN: 0.5000 mg | INTRAMUSCULAR | Status: DC | PRN

## 2019-04-28 MED ORDER — SERTRALINE HCL 50 MG PO TABS
50.0000 mg | ORAL_TABLET | Freq: Every morning | ORAL | Status: DC
  Administered 2019-04-29: 50 mg via ORAL
  Filled 2019-04-28: qty 1

## 2019-04-28 MED ORDER — FERROUS SULFATE 325 (65 FE) MG PO TABS: 325.0000 mg | ORAL_TABLET | Freq: Three times a day (TID) | ORAL | 3 refills | Status: DC

## 2019-04-28 MED ORDER — ATORVASTATIN CALCIUM 10 MG PO TABS
10.0000 mg | ORAL_TABLET | Freq: Every day | ORAL | Status: DC
  Administered 2019-04-28: 10 mg via ORAL
  Filled 2019-04-28: qty 1

## 2019-04-28 MED ORDER — DEXAMETHASONE SODIUM PHOSPHATE 10 MG/ML IJ SOLN
INTRAMUSCULAR | Status: AC
  Filled 2019-04-28: qty 1

## 2019-04-28 MED ORDER — ASPIRIN 81 MG PO CHEW
81.0000 mg | CHEWABLE_TABLET | Freq: Two times a day (BID) | ORAL | Status: DC
  Administered 2019-04-28 – 2019-04-29 (×2): 81 mg via ORAL
  Filled 2019-04-28 (×2): qty 1

## 2019-04-28 MED ORDER — METOCLOPRAMIDE HCL 5 MG PO TABS: 5.0000 mg | ORAL_TABLET | Freq: Three times a day (TID) | ORAL | Status: DC | PRN

## 2019-04-28 MED ORDER — FENTANYL CITRATE (PF) 100 MCG/2ML IJ SOLN: 25.0000 ug | INTRAMUSCULAR | Status: DC | PRN

## 2019-04-28 MED ORDER — BUPIVACAINE HCL (PF) 0.5 % IJ SOLN
INTRAMUSCULAR | Status: DC | PRN
  Administered 2019-04-28: 20 mL via PERINEURAL

## 2019-04-28 MED ORDER — DOCUSATE SODIUM 100 MG PO CAPS
100.0000 mg | ORAL_CAPSULE | Freq: Two times a day (BID) | ORAL | Status: DC
  Administered 2019-04-28 – 2019-04-29 (×2): 100 mg via ORAL
  Filled 2019-04-28 (×2): qty 1

## 2019-04-28 MED ORDER — ALUM & MAG HYDROXIDE-SIMETH 200-200-20 MG/5ML PO SUSP: 15.0000 mL | ORAL | Status: DC | PRN

## 2019-04-28 MED ORDER — TRANEXAMIC ACID-NACL 1000-0.7 MG/100ML-% IV SOLN
1000.0000 mg | INTRAVENOUS | Status: AC
  Administered 2019-04-28: 1000 mg via INTRAVENOUS
  Filled 2019-04-28: qty 100

## 2019-04-28 MED ORDER — PROPOFOL 10 MG/ML IV BOLUS
INTRAVENOUS | Status: DC | PRN
  Administered 2019-04-28: 20 mg via INTRAVENOUS

## 2019-04-28 MED ORDER — PANTOPRAZOLE SODIUM 40 MG PO TBEC: 40.0000 mg | DELAYED_RELEASE_TABLET | Freq: Every day | ORAL | Status: DC | PRN

## 2019-04-28 MED ORDER — ONDANSETRON HCL 4 MG/2ML IJ SOLN
INTRAMUSCULAR | Status: AC
  Filled 2019-04-28: qty 2

## 2019-04-28 MED ORDER — FERROUS SULFATE 325 (65 FE) MG PO TABS
325.0000 mg | ORAL_TABLET | Freq: Two times a day (BID) | ORAL | Status: DC
  Administered 2019-04-28 – 2019-04-29 (×2): 325 mg via ORAL
  Filled 2019-04-28 (×2): qty 1

## 2019-04-28 MED ORDER — ACETAMINOPHEN 500 MG PO TABS
1000.0000 mg | ORAL_TABLET | Freq: Once | ORAL | Status: AC
  Administered 2019-04-28: 1000 mg via ORAL
  Filled 2019-04-28: qty 2

## 2019-04-28 MED ORDER — BISACODYL 10 MG RE SUPP: 10.0000 mg | Freq: Every day | RECTAL | Status: DC | PRN

## 2019-04-28 MED ORDER — POLYETHYLENE GLYCOL 3350 17 G PO PACK: 17.0000 g | PACK | Freq: Two times a day (BID) | ORAL | 0 refills | Status: DC

## 2019-04-28 MED ORDER — CEFAZOLIN SODIUM-DEXTROSE 2-4 GM/100ML-% IV SOLN
2.0000 g | Freq: Four times a day (QID) | INTRAVENOUS | Status: AC
  Administered 2019-04-28 (×2): 2 g via INTRAVENOUS
  Filled 2019-04-28 (×2): qty 100

## 2019-04-28 MED ORDER — CHLORHEXIDINE GLUCONATE 4 % EX LIQD: 60.0000 mL | Freq: Once | CUTANEOUS | Status: DC

## 2019-04-28 MED ORDER — ONDANSETRON HCL 4 MG PO TABS: 4.0000 mg | ORAL_TABLET | Freq: Four times a day (QID) | ORAL | Status: DC | PRN

## 2019-04-28 MED ORDER — 0.9 % SODIUM CHLORIDE (POUR BTL) OPTIME
TOPICAL | Status: DC | PRN
  Administered 2019-04-28: 08:00:00 1000 mL

## 2019-04-28 MED ORDER — TRANEXAMIC ACID-NACL 1000-0.7 MG/100ML-% IV SOLN
1000.0000 mg | Freq: Once | INTRAVENOUS | Status: AC
  Administered 2019-04-28: 1000 mg via INTRAVENOUS
  Filled 2019-04-28: qty 100

## 2019-04-28 MED ORDER — MAGNESIUM CITRATE PO SOLN: 1.0000 | Freq: Once | ORAL | Status: DC | PRN

## 2019-04-28 MED ORDER — ACETAMINOPHEN 325 MG PO TABS: 325.0000 mg | ORAL_TABLET | Freq: Four times a day (QID) | ORAL | Status: DC | PRN

## 2019-04-28 MED ORDER — METHOCARBAMOL 500 MG IVPB - SIMPLE MED
INTRAVENOUS | Status: AC
  Filled 2019-04-28: qty 50

## 2019-04-28 MED ORDER — KETOROLAC TROMETHAMINE 30 MG/ML IJ SOLN
INTRAMUSCULAR | Status: DC | PRN
  Administered 2019-04-28: 30 mg via INTRA_ARTICULAR

## 2019-04-28 MED ORDER — POVIDONE-IODINE 10 % EX SWAB
2.0000 "application " | Freq: Once | CUTANEOUS | Status: AC
  Administered 2019-04-28: 2 via TOPICAL

## 2019-04-28 MED ORDER — CLONIDINE HCL (ANALGESIA) 100 MCG/ML EP SOLN
EPIDURAL | Status: DC | PRN
  Administered 2019-04-28: 100 ug

## 2019-04-28 MED ORDER — HYDROCODONE-ACETAMINOPHEN 7.5-325 MG PO TABS: 1.0000 | ORAL_TABLET | ORAL | Status: DC | PRN

## 2019-04-28 MED ORDER — ONDANSETRON HCL 4 MG/2ML IJ SOLN: 4.0000 mg | Freq: Four times a day (QID) | INTRAMUSCULAR | Status: DC | PRN

## 2019-04-28 MED ORDER — KETOROLAC TROMETHAMINE 30 MG/ML IJ SOLN
INTRAMUSCULAR | Status: AC
  Filled 2019-04-28: qty 1

## 2019-04-28 MED ORDER — NITROGLYCERIN 0.4 MG SL SUBL: 0.4000 mg | SUBLINGUAL_TABLET | SUBLINGUAL | Status: DC | PRN

## 2019-04-28 MED ORDER — METOPROLOL SUCCINATE ER 50 MG PO TB24
50.0000 mg | ORAL_TABLET | Freq: Every day | ORAL | Status: DC
  Administered 2019-04-29: 50 mg via ORAL
  Filled 2019-04-28: qty 1

## 2019-04-28 MED ORDER — EPHEDRINE SULFATE-NACL 50-0.9 MG/10ML-% IV SOSY
PREFILLED_SYRINGE | INTRAVENOUS | Status: DC | PRN
  Administered 2019-04-28 (×2): 10 mg via INTRAVENOUS
  Administered 2019-04-28: 5 mg via INTRAVENOUS
  Administered 2019-04-28: 10 mg via INTRAVENOUS
  Administered 2019-04-28: 5 mg via INTRAVENOUS

## 2019-04-28 MED ORDER — COLCHICINE 0.6 MG PO TABS: 0.6000 mg | ORAL_TABLET | Freq: Every day | ORAL | Status: DC | PRN

## 2019-04-28 MED ORDER — POLYETHYLENE GLYCOL 3350 17 G PO PACK
17.0000 g | PACK | Freq: Two times a day (BID) | ORAL | Status: DC
  Administered 2019-04-28 – 2019-04-29 (×2): 17 g via ORAL
  Filled 2019-04-28 (×2): qty 1

## 2019-04-28 MED ORDER — MIDAZOLAM HCL 2 MG/2ML IJ SOLN
INTRAMUSCULAR | Status: DC | PRN
  Administered 2019-04-28 (×2): 1 mg via INTRAVENOUS

## 2019-04-28 MED ORDER — MENTHOL 3 MG MT LOZG: 1.0000 | LOZENGE | OROMUCOSAL | Status: DC | PRN

## 2019-04-28 MED ORDER — BUPIVACAINE IN DEXTROSE 0.75-8.25 % IT SOLN
INTRATHECAL | Status: DC | PRN
  Administered 2019-04-28: 1.6 mL via INTRATHECAL

## 2019-04-28 MED ORDER — SODIUM CHLORIDE 0.9 % IR SOLN
Status: DC | PRN
  Administered 2019-04-28: 1000 mL

## 2019-04-28 MED ORDER — ONDANSETRON HCL 4 MG/2ML IJ SOLN
INTRAMUSCULAR | Status: DC | PRN
  Administered 2019-04-28: 4 mg via INTRAVENOUS

## 2019-04-28 MED ORDER — CEFAZOLIN SODIUM-DEXTROSE 2-4 GM/100ML-% IV SOLN
2.0000 g | INTRAVENOUS | Status: AC
  Administered 2019-04-28: 07:00:00 2 g via INTRAVENOUS
  Filled 2019-04-28: qty 100

## 2019-04-28 MED ORDER — LACTATED RINGERS IV SOLN
INTRAVENOUS | Status: DC
  Administered 2019-04-28: 06:00:00 via INTRAVENOUS

## 2019-04-28 MED ORDER — PROPOFOL 10 MG/ML IV BOLUS
INTRAVENOUS | Status: AC
  Filled 2019-04-28: qty 20

## 2019-04-28 MED ORDER — PROPOFOL 500 MG/50ML IV EMUL
INTRAVENOUS | Status: DC | PRN
  Administered 2019-04-28: 50 ug/kg/min via INTRAVENOUS

## 2019-04-28 MED ORDER — PHENOL 1.4 % MT LIQD
1.0000 | OROMUCOSAL | Status: DC | PRN
  Filled 2019-04-28: qty 177

## 2019-04-28 MED ORDER — METHOCARBAMOL 500 MG PO TABS: 500.0000 mg | ORAL_TABLET | Freq: Four times a day (QID) | ORAL | 0 refills | Status: DC | PRN

## 2019-04-28 MED ORDER — PROPOFOL 10 MG/ML IV BOLUS
INTRAVENOUS | Status: AC
  Filled 2019-04-28: qty 60

## 2019-04-28 MED ORDER — BUPIVACAINE-EPINEPHRINE (PF) 0.25% -1:200000 IJ SOLN
INTRAMUSCULAR | Status: AC
  Filled 2019-04-28: qty 30

## 2019-04-28 MED ORDER — ASPIRIN 81 MG PO CHEW: 81.0000 mg | CHEWABLE_TABLET | Freq: Two times a day (BID) | ORAL | 0 refills | Status: AC

## 2019-04-28 MED ORDER — SODIUM CHLORIDE (PF) 0.9 % IJ SOLN
INTRAMUSCULAR | Status: DC | PRN
  Administered 2019-04-28: 30 mL

## 2019-04-28 MED ORDER — LIDOCAINE 2% (20 MG/ML) 5 ML SYRINGE
INTRAMUSCULAR | Status: AC
  Filled 2019-04-28: qty 5

## 2019-04-28 MED ORDER — STERILE WATER FOR IRRIGATION IR SOLN
Status: DC | PRN
  Administered 2019-04-28: 2000 mL

## 2019-04-28 MED ORDER — METHOCARBAMOL 500 MG IVPB - SIMPLE MED
500.0000 mg | Freq: Four times a day (QID) | INTRAVENOUS | Status: DC | PRN
  Filled 2019-04-28: qty 50

## 2019-04-28 MED ORDER — HYDROCODONE-ACETAMINOPHEN 7.5-325 MG PO TABS: 1.0000 | ORAL_TABLET | ORAL | 0 refills | Status: DC | PRN

## 2019-04-28 MED ORDER — DEXAMETHASONE SODIUM PHOSPHATE 10 MG/ML IJ SOLN
10.0000 mg | Freq: Once | INTRAMUSCULAR | Status: AC
  Administered 2019-04-28: 08:00:00 10 mg via INTRAVENOUS

## 2019-04-28 MED ORDER — METHOCARBAMOL 500 MG PO TABS
500.0000 mg | ORAL_TABLET | Freq: Four times a day (QID) | ORAL | Status: DC | PRN
  Administered 2019-04-28 – 2019-04-29 (×2): 500 mg via ORAL
  Filled 2019-04-28 (×2): qty 1

## 2019-04-28 MED ORDER — DIPHENHYDRAMINE HCL 12.5 MG/5ML PO ELIX: 12.5000 mg | ORAL_SOLUTION | ORAL | Status: DC | PRN

## 2019-04-28 MED ORDER — FLUTICASONE PROPIONATE 50 MCG/ACT NA SUSP
1.0000 | Freq: Every day | NASAL | Status: DC | PRN
  Filled 2019-04-28: qty 16

## 2019-04-28 MED ORDER — BUPIVACAINE-EPINEPHRINE (PF) 0.25% -1:200000 IJ SOLN
INTRAMUSCULAR | Status: DC | PRN
  Administered 2019-04-28: 30 mL via PERINEURAL

## 2019-04-28 MED ORDER — HYDROCODONE-ACETAMINOPHEN 5-325 MG PO TABS
1.0000 | ORAL_TABLET | ORAL | Status: DC | PRN
  Administered 2019-04-28 – 2019-04-29 (×3): 2 via ORAL
  Filled 2019-04-28 (×3): qty 2

## 2019-04-28 MED ORDER — SODIUM CHLORIDE 0.9 % IV SOLN
INTRAVENOUS | Status: DC
  Administered 2019-04-28 (×2): via INTRAVENOUS

## 2019-04-28 MED ORDER — MIDAZOLAM HCL 2 MG/2ML IJ SOLN
INTRAMUSCULAR | Status: AC
  Filled 2019-04-28: qty 2

## 2019-04-28 MED ORDER — FENTANYL CITRATE (PF) 100 MCG/2ML IJ SOLN
INTRAMUSCULAR | Status: DC | PRN
  Administered 2019-04-28: 50 ug via INTRAVENOUS
  Administered 2019-04-28: 25 ug via INTRAVENOUS

## 2019-04-28 MED ORDER — CELECOXIB 200 MG PO CAPS
200.0000 mg | ORAL_CAPSULE | Freq: Two times a day (BID) | ORAL | Status: DC
  Administered 2019-04-28: 200 mg via ORAL
  Filled 2019-04-28: qty 1

## 2019-04-28 SURGICAL SUPPLY — 61 items
ATTUNE MED ANAT PAT 38 KNEE (Knees) ×1 IMPLANT
ATTUNE MED ANAT PAT 38MM KNEE (Knees) ×1 IMPLANT
ATTUNE PS FEM RT SZ 6 CEM KNEE (Femur) ×2 IMPLANT
ATTUNE PSRP INSR SZ6 6 KNEE (Insert) ×1 IMPLANT
ATTUNE PSRP INSR SZ6 6MM KNEE (Insert) ×1 IMPLANT
BAG ZIPLOCK 12X15 (MISCELLANEOUS) IMPLANT
BANDAGE ACE 6X5 VEL STRL LF (GAUZE/BANDAGES/DRESSINGS) ×5 IMPLANT
BASE TIBIA ATTUNE KNEE SYS SZ6 (Knees) IMPLANT
BLADE SAW SGTL 11.0X1.19X90.0M (BLADE) IMPLANT
BLADE SAW SGTL 13.0X1.19X90.0M (BLADE) ×3 IMPLANT
BLADE SURG SZ10 CARB STEEL (BLADE) ×6 IMPLANT
BOWL SMART MIX CTS (DISPOSABLE) ×3 IMPLANT
CEMENT HV SMART SET (Cement) ×4 IMPLANT
COVER SURGICAL LIGHT HANDLE (MISCELLANEOUS) ×3 IMPLANT
COVER WAND RF STERILE (DRAPES) ×2 IMPLANT
CUFF TOURN SGL QUICK 34 (TOURNIQUET CUFF) ×2
CUFF TRNQT CYL 34X4.125X (TOURNIQUET CUFF) ×1 IMPLANT
DECANTER SPIKE VIAL GLASS SM (MISCELLANEOUS) ×6 IMPLANT
DERMABOND ADVANCED (GAUZE/BANDAGES/DRESSINGS) ×2
DERMABOND ADVANCED .7 DNX12 (GAUZE/BANDAGES/DRESSINGS) ×1 IMPLANT
DRAPE U-SHAPE 47X51 STRL (DRAPES) ×3 IMPLANT
DRESSING AQUACEL AG SP 3.5X10 (GAUZE/BANDAGES/DRESSINGS) ×1 IMPLANT
DRSG AQUACEL AG SP 3.5X10 (GAUZE/BANDAGES/DRESSINGS) ×3
DURAPREP 26ML APPLICATOR (WOUND CARE) ×6 IMPLANT
ELECT REM PT RETURN 15FT ADLT (MISCELLANEOUS) ×3 IMPLANT
GLOVE BIO SURGEON STRL SZ 6 (GLOVE) ×3 IMPLANT
GLOVE BIOGEL PI IND STRL 6.5 (GLOVE) ×1 IMPLANT
GLOVE BIOGEL PI IND STRL 7.5 (GLOVE) ×1 IMPLANT
GLOVE BIOGEL PI IND STRL 8.5 (GLOVE) ×1 IMPLANT
GLOVE BIOGEL PI INDICATOR 6.5 (GLOVE) ×2
GLOVE BIOGEL PI INDICATOR 7.5 (GLOVE) ×2
GLOVE BIOGEL PI INDICATOR 8.5 (GLOVE) ×2
GLOVE ECLIPSE 8.0 STRL XLNG CF (GLOVE) ×3 IMPLANT
GLOVE ORTHO TXT STRL SZ7.5 (GLOVE) ×3 IMPLANT
GOWN STRL REUS W/ TWL LRG LVL3 (GOWN DISPOSABLE) ×1 IMPLANT
GOWN STRL REUS W/TWL 2XL LVL3 (GOWN DISPOSABLE) ×3 IMPLANT
GOWN STRL REUS W/TWL LRG LVL3 (GOWN DISPOSABLE) ×5 IMPLANT
HANDPIECE INTERPULSE COAX TIP (DISPOSABLE) ×2
HOLDER FOLEY CATH W/STRAP (MISCELLANEOUS) ×2 IMPLANT
KIT TURNOVER KIT A (KITS) ×2 IMPLANT
MANIFOLD NEPTUNE II (INSTRUMENTS) ×3 IMPLANT
NDL SAFETY ECLIPSE 18X1.5 (NEEDLE) IMPLANT
NEEDLE HYPO 18GX1.5 SHARP (NEEDLE) ×2
NS IRRIG 1000ML POUR BTL (IV SOLUTION) ×3 IMPLANT
PACK TOTAL KNEE CUSTOM (KITS) ×3 IMPLANT
PIN FIX SIGMA HP QUICK REL (PIN) ×2 IMPLANT
PIN THREADED HEADED SIGMA (PIN) ×2 IMPLANT
PROTECTOR NERVE ULNAR (MISCELLANEOUS) ×3 IMPLANT
SET HNDPC FAN SPRY TIP SCT (DISPOSABLE) ×1 IMPLANT
SET PAD KNEE POSITIONER (MISCELLANEOUS) ×3 IMPLANT
SUT MNCRL AB 4-0 PS2 18 (SUTURE) ×3 IMPLANT
SUT STRATAFIX PDS+ 0 24IN (SUTURE) ×3 IMPLANT
SUT VIC AB 1 CT1 36 (SUTURE) ×3 IMPLANT
SUT VIC AB 2-0 CT1 27 (SUTURE) ×6
SUT VIC AB 2-0 CT1 TAPERPNT 27 (SUTURE) ×3 IMPLANT
SYR 3ML LL SCALE MARK (SYRINGE) ×3 IMPLANT
TIBIA ATTUNE KNEE SYS BASE SZ6 (Knees) ×3 IMPLANT
TRAY FOLEY MTR SLVR 16FR STAT (SET/KITS/TRAYS/PACK) ×3 IMPLANT
WATER STERILE IRR 1000ML POUR (IV SOLUTION) ×6 IMPLANT
WRAP KNEE MAXI GEL POST OP (GAUZE/BANDAGES/DRESSINGS) ×3 IMPLANT
YANKAUER SUCT BULB TIP 10FT TU (MISCELLANEOUS) ×3 IMPLANT

## 2019-04-28 NOTE — Anesthesia Postprocedure Evaluation (Signed)
Anesthesia Post Note  Patient: Dale [name]  Procedure(s) Performed: TOTAL KNEE ARTHROPLASTY (Right )     Patient location during evaluation: PACU Anesthesia Type: Regional and Spinal Level of consciousness: oriented and awake and alert Pain management: pain level controlled Vital Signs Assessment: post-procedure vital signs reviewed and stable Respiratory status: spontaneous breathing, respiratory function stable and patient connected to nasal cannula oxygen Cardiovascular status: blood pressure returned to baseline and stable Postop Assessment: no headache, no backache and no apparent nausea or vomiting Anesthetic complications: no    Last Vitals:  Vitals:   04/28/19 1220 04/28/19 1326  BP: 125/68 (!) 141/72  Pulse: 71 79  Resp: 18 18  Temp: 36.6 C 36.6 C  SpO2: 98% 96%    Last Pain:  Vitals:   04/28/19 1326  TempSrc: Oral  PainSc:                  [name]

## 2019-04-28 NOTE — Anesthesia Procedure Notes (Signed)
Spinal  Patient location during procedure: OR Start time: 04/28/2019 7:20 AM End time: 04/28/2019 7:30 AM Staffing Anesthesiologist: Freddrick March, MD Performed: anesthesiologist  Preanesthetic Checklist Completed: patient identified, surgical consent, pre-op evaluation, timeout performed, IV checked, risks and benefits discussed and monitors and equipment checked Spinal Block Patient position: sitting Prep: site prepped and draped and DuraPrep Patient monitoring: cardiac monitor, continuous pulse ox and blood pressure Approach: midline Location: L3-4 Injection technique: single-shot Needle Needle type: Quincke  Needle gauge: 22 G Needle length: 9 cm Assessment Sensory level: T6 Additional Notes Functioning IV was confirmed and monitors were applied. Sterile prep and drape, including hand hygiene and sterile gloves were used. The patient was positioned and the spine was prepped. The skin was anesthetized with lidocaine.  Free flow of clear CSF was obtained prior to injecting local anesthetic into the CSF.  The spinal needle aspirated freely following injection.  The needle was carefully withdrawn.  The patient tolerated the procedure well.

## 2019-04-28 NOTE — Transfer of Care (Signed)
Immediate Anesthesia Transfer of Care Note  Patient: Dale [name]  Procedure(s) Performed: TOTAL KNEE ARTHROPLASTY (Right )  Patient Location: PACU  Anesthesia Type:Spinal  Level of Consciousness: awake, alert  and oriented  Airway & Oxygen Therapy: Patient Spontanous Breathing and Patient connected to face mask oxygen  Post-op Assessment: Report given to RN and Post -op Vital signs reviewed and stable  Post vital signs: Reviewed and stable  Last Vitals:  Vitals Value Taken Time  BP    Temp    Pulse 60 04/28/2019  9:24 AM  Resp 14 04/28/2019  9:24 AM  SpO2 97 % 04/28/2019  9:24 AM  Vitals shown include unvalidated device data.  Last Pain:  Vitals:   04/28/19 0616  TempSrc: Oral  PainSc:       Patients Stated Pain Goal: 4 (07/17/47 1856)  Complications: No apparent anesthesia complications

## 2019-04-28 NOTE — Evaluation (Signed)
Physical Therapy Evaluation Patient Details Name: Dale [name] MRN: [id number] DOB: July 31, 1946 Today's Date: 04/28/2019   History of Present Illness  73 yo male s/p R TKA 04/28/19. Hx of L TKA 09/2018  Clinical Impression  On eval POD 0, pt was Min guard assist for mobility. He walked ~150 feet with a RW. Mild pain with activity. Will follow and progress activity as tolerated. Plan is for d/c home.     Follow Up Recommendations Follow surgeon's recommendation for DC plan and follow-up therapies    Equipment Recommendations  None recommended by PT    Recommendations for Other Services       Precautions / Restrictions Precautions Precautions: Fall Restrictions Weight Bearing Restrictions: No Other Position/Activity Restrictions: WBAT      Mobility  Bed Mobility Overal bed mobility: Needs Assistance Bed Mobility: Supine to Sit     Supine to sit: Supervision;HOB elevated     General bed mobility comments: for safety, lines.   Transfers Overall transfer level: Needs assistance Equipment used: Rolling walker (2 wheeled) Transfers: Sit to/from Stand Sit to Stand: Supervision         General transfer comment: VCs safety, hand placement.   Ambulation/Gait Ambulation/Gait assistance: Min guard Gait Distance (Feet): 150 Feet Assistive device: Rolling walker (2 wheeled) Gait Pattern/deviations: Step-through pattern;Decreased stride length     General Gait Details: cloe guard for safety. Mild pain with ambulation.   Stairs            Wheelchair Mobility    Modified Rankin (Stroke Patients Only)       Balance Overall balance assessment: Mild deficits observed, not formally tested                                           Pertinent Vitals/Pain Pain Assessment: 0-10 Pain Score: 5  Pain Location: R knee Pain Descriptors / Indicators: Aching;Sore Pain Intervention(s): Monitored during session;Repositioned;Ice applied    Home  Living Family/patient expects to be discharged to:: Private residence Living Arrangements: Spouse/significant other Available Help at Discharge: Family;Available PRN/intermittently Type of Home: House Home Access: Stairs to enter Entrance Stairs-Rails: None Entrance Stairs-Number of Steps: 3 Home Layout: One level Home Equipment: Clinical cytogeneticist - 2 wheels;Cane - single point;Crutches;Walker - 4 wheels      Prior Function Level of Independence: Independent               Hand Dominance        Extremity/Trunk Assessment   Upper Extremity Assessment Upper Extremity Assessment: Overall WFL for tasks assessed    Lower Extremity Assessment Lower Extremity Assessment: (post op weakness s/p R TKA)    Cervical / Trunk Assessment Cervical / Trunk Assessment: Normal  Communication   Communication: No difficulties  Cognition Arousal/Alertness: Awake/alert Behavior During Therapy: WFL for tasks assessed/performed Overall Cognitive Status: Within Functional Limits for tasks assessed                                        General Comments      Exercises     Assessment/Plan    PT Assessment Patient needs continued PT services  PT Problem List Decreased strength;Decreased range of motion;Decreased mobility;Decreased balance;Decreased knowledge of use of DME;Pain;Decreased activity tolerance       PT Treatment Interventions DME instruction;Gait  training;Therapeutic activities;Functional mobility training;Balance training;Patient/family education;Therapeutic exercise;Stair training    PT Goals (Current goals can be found in the Care Plan section)  Acute Rehab PT Goals Patient Stated Goal: regain PLOF PT Goal Formulation: With patient Time For Goal Achievement: 05/12/19 Potential to Achieve Goals: Good    Frequency 7X/week   Barriers to discharge        Co-evaluation               AM-PAC PT "6 Clicks" Mobility  Outcome Measure Help  needed turning from your back to your side while in a flat bed without using bedrails?: A Little Help needed moving from lying on your back to sitting on the side of a flat bed without using bedrails?: A Little Help needed moving to and from a bed to a chair (including a wheelchair)?: A Little Help needed standing up from a chair using your arms (e.g., wheelchair or bedside chair)?: A Little Help needed to walk in hospital room?: A Little Help needed climbing 3-5 steps with a railing? : A Little 6 Click Score: 18    End of Session Equipment Utilized During Treatment: Gait belt Activity Tolerance: Patient tolerated treatment well Patient left: in chair;with call bell/phone within reach   PT Visit Diagnosis: Pain;Other abnormalities of gait and mobility (R26.89) Pain - Right/Left: Right Pain - part of body: Knee    Time: 4388-8757 PT Time Calculation (min) (ACUTE ONLY): 14 min   Charges:   PT Evaluation $PT Eval Low Complexity: [place], PT Acute Rehabilitation Services Pager: [phone] Office: [phone]

## 2019-04-28 NOTE — Discharge Instructions (Signed)

## 2019-04-28 NOTE — Anesthesia Procedure Notes (Signed)
Procedure Name: MAC Date/Time: 04/28/2019 7:19 AM Performed by: Eben Burow, CRNA Pre-anesthesia Checklist: Patient identified, Emergency Drugs available, Suction available, Patient being monitored and Timeout performed Oxygen Delivery Method: Simple face mask Dental Injury: Teeth and Oropharynx as per pre-operative assessment

## 2019-04-28 NOTE — Op Note (Signed)
NAME:  Dale [name]                      MEDICAL RECORD NO.:  [id number]                             FACILITY:  [facility]      PHYSICIAN:  Pietro Cassis. Alvan Dame, M.D.  DATE OF BIRTH:  July 07, 1946      DATE OF PROCEDURE:  04/28/2019                                     OPERATIVE REPORT         PREOPERATIVE DIAGNOSIS:  Right knee osteoarthritis.      POSTOPERATIVE DIAGNOSIS:  Right knee osteoarthritis.      FINDINGS:  The patient was noted to have complete loss of cartilage and   bone-on-bone arthritis with associated osteophytes in all three compartments of   the knee worse lateral and patellofemoral with a significant synovitis and associated effusion.  The patient had failed months of conservative treatment including medications, injection therapy, activity modification.     PROCEDURE:  Right total knee replacement.      COMPONENTS USED:  DePuy Attune rotating platform posterior stabilized knee   system, a size 6 femur, 6 tibia, size 6 mm PS AOX insert, and 38 anatomic patellar   button.      SURGEON:  Pietro Cassis. Alvan Dame, M.D.      ASSISTANT:  Griffith Citron, PA-C.      ANESTHESIA:  Regional and Spinal.      SPECIMENS:  None.      COMPLICATION:  None.      DRAINS:  None.  EBL: <100cc      TOURNIQUET TIME:   Total Tourniquet Time Documented: Thigh (Right) - 36 minutes Total: Thigh (Right) - 36 minutes  .      The patient was stable to the recovery room.      INDICATION FOR PROCEDURE:  Dale [name] is a 73 y.o. male patient of   mine.  The patient had been seen, evaluated, and treated for months conservatively in the   office with medication, activity modification, and injections.  The patient had   radiographic changes of bone-on-bone arthritis with endplate sclerosis and osteophytes noted.  Based on the radiographic changes and failed conservative measures, the patient   decided to proceed with definitive treatment, total knee replacement.  Risks of infection,  DVT, component failure, need for revision surgery, neurovascular injury were reviewed in the office setting.  The postop course was reviewed stressing the efforts to maximize post-operative satisfaction and function.  Consent was obtained for benefit of pain   relief.      PROCEDURE IN DETAIL:  The patient was brought to the operative theater.   Once adequate anesthesia, preoperative antibiotics, 2 gm of Ancef,1 gm of Tranexamic Acid, and 10 mg of Decadron administered, the patient was positioned supine with a right thigh tourniquet placed.  The  right lower extremity was prepped and draped in sterile fashion.  A time-   out was performed identifying the patient, planned procedure, and the appropriate extremity.      The right lower extremity was placed in the [facility] leg holder.  The leg was   exsanguinated, tourniquet elevated to 250 mmHg.  A midline incision was  made followed by median parapatellar arthrotomy.  Following initial   exposure, attention was first directed to the patella.  Precut   measurement was noted to be 26 mm.  I resected down to 15 mm and used a   38 anatomic patellar button to restore patellar height as well as cover the cut surface.      The lug holes were drilled and a metal shim was placed to protect the   patella from retractors and saw blade during the procedure.      At this point, attention was now directed to the femur.  The femoral   canal was opened with a drill, irrigated to try to prevent fat emboli.  An   intramedullary rod was passed at 5 degrees valgus, 11 mm of bone was   resected off the distal femur.  Following this resection, the tibia was   subluxated anteriorly.  Using the extramedullary guide, 2 mm of bone was resected off   the proximal lateral tibia.  We confirmed the gap would be   stable medially and laterally with a size 5 spacer block as well as confirmed that the tibial cut was perpendicular in the coronal plane, checking with an alignment  rod.      Once this was done, I sized the femur to be a size 6 in the anterior-   posterior dimension, chose a standard component based on medial and   lateral dimension.  The size 6 rotation block was then pinned in   position anterior referenced using the C-clamp to set rotation.  The   anterior, posterior, and  chamfer cuts were made without difficulty nor   notching making certain that I was along the anterior cortex to help   with flexion gap stability.      The final box cut was made off the lateral aspect of distal femur.  Following the box cut I used a laminar spreader to be able to remove posterior femoral osteophytes.     At this point, the tibia was sized to be a size 6.  The size 6 tray was   then pinned in position through the medial third of the tubercle,   drilled, and keel punched.  Trial reduction was now carried with a 6 femur,  6 tibia, a size 6 mm PS insert, and the 38 anatomic patella botton.  The knee was brought to full extension with good flexion stability with the patella   tracking through the trochlea without application of pressure.  Given   all these findings the trial components removed.  Final components were   opened and cement was mixed.  The knee was irrigated with normal saline solution and pulse lavage.  The synovial lining was   then injected with 30 cc of 0.25% Marcaine with epinephrine, 1 cc of Toradol and 30 cc of NS for a total of 61 cc.     Final implants were then cemented onto cleaned and dried cut surfaces of bone with the knee brought to extension with a size 6 mm PS trial insert.      Once the cement had fully cured, excess cement was removed   throughout the knee.  I confirmed that I was satisfied with the range of   motion and stability, and the final size 6 mm PS AOX insert was chosen.  It was   placed into the knee.      The tourniquet had been let down at 36 minutes.  No significant   hemostasis was required.  The extensor mechanism was  then reapproximated using #1 Vicryl and #1 Stratafix sutures with the knee   in flexion.  The   remaining wound was closed with 2-0 Vicryl and running 4-0 Monocryl.   The knee was cleaned, dried, dressed sterilely using Dermabond and   Aquacel dressing.  The patient was then   brought to recovery room in stable condition, tolerating the procedure   well.   Please note that Physician Assistant, Griffith Citron, PA-C was present for the entirety of the case, and was utilized for pre-operative positioning, peri-operative retractor management, general facilitation of the procedure and for primary wound closure at the end of the case.              Pietro Cassis Alvan Dame, M.D.    04/28/2019 8:48 AM

## 2019-04-28 NOTE — Interval H&P Note (Signed)
History and Physical Interval Note:  04/28/2019 7:09 AM  Dale [name]  has presented today for surgery, with the diagnosis of Right knee osteoarthritis.  The various methods of treatment have been discussed with the patient and family. After consideration of risks, benefits and other options for treatment, the patient has consented to  Procedure(s) with comments: TOTAL KNEE ARTHROPLASTY (Right) - 70 mins as a surgical intervention.  The patient's history has been reviewed, patient examined, no change in status, stable for surgery.  I have reviewed the patient's chart and labs.  Questions were answered to the patient's satisfaction.     [name]

## 2019-04-28 NOTE — Anesthesia Procedure Notes (Addendum)
Anesthesia Regional Block: Adductor canal block   Pre-Anesthetic Checklist: ,, timeout performed, Correct Patient, Correct Site, Correct Laterality, Correct Procedure, Correct Position, site marked, Risks and benefits discussed,  Surgical consent,  Pre-op evaluation,  At surgeon's request and post-op pain management  Laterality: Right  Prep: Maximum Sterile Barrier Precautions used, chloraprep       Needles:  Injection technique: Single-shot  Needle Type: Echogenic Stimulator Needle     Needle Length: 9cm  Needle Gauge: 22     Additional Needles:   Procedures:,,,, ultrasound used (permanent image in chart),,,,  Narrative:  Start time: 04/28/2019 6:58 AM End time: 04/28/2019 7:08 AM Injection made incrementally with aspirations every 5 mL.  Performed by: Personally  Anesthesiologist: Freddrick March, MD  Additional Notes: Monitors applied. No increased pain on injection. No increased resistance to injection. Injection made in 5cc increments. Good needle visualization. Patient tolerated procedure well.

## 2019-04-29 ENCOUNTER — Encounter (HOSPITAL_COMMUNITY): Payer: Self-pay | Admitting: Orthopedic Surgery

## 2019-04-29 ENCOUNTER — Other Ambulatory Visit: Payer: Self-pay | Admitting: Interventional Cardiology

## 2019-04-29 DIAGNOSIS — M5137 Other intervertebral disc degeneration, lumbosacral region: Secondary | ICD-10-CM | POA: Diagnosis not present

## 2019-04-29 LAB — CBC
HCT: 43.9 % (ref 39.0–52.0)
Hemoglobin: 14.6 g/dL (ref 13.0–17.0)
MCH: 32.6 pg (ref 26.0–34.0)
MCHC: 33.3 g/dL (ref 30.0–36.0)
MCV: 98 fL (ref 80.0–100.0)
Platelets: 167 10*3/uL (ref 150–400)
RBC: 4.48 MIL/uL (ref 4.22–5.81)
RDW: 13.6 % (ref 11.5–15.5)
WBC: 14.7 10*3/uL — ABNORMAL HIGH (ref 4.0–10.5)
nRBC: 0 % (ref 0.0–0.2)

## 2019-04-29 LAB — BASIC METABOLIC PANEL
Anion gap: 12 (ref 5–15)
BUN: 25 mg/dL — ABNORMAL HIGH (ref 8–23)
CO2: 22 mmol/L (ref 22–32)
Calcium: 9.3 mg/dL (ref 8.9–10.3)
Chloride: 101 mmol/L (ref 98–111)
Creatinine, Ser: 1.58 mg/dL — ABNORMAL HIGH (ref 0.61–1.24)
GFR calc Af Amer: 50 mL/min — ABNORMAL LOW (ref >60)
GFR calc non Af Amer: 43 mL/min — ABNORMAL LOW (ref >60)
Glucose, Bld: 127 mg/dL — ABNORMAL HIGH (ref 70–99)
Potassium: 3.5 mmol/L (ref 3.5–5.1)
Sodium: 135 mmol/L (ref 135–145)

## 2019-04-29 MED ORDER — POTASSIUM CHLORIDE CRYS ER 20 MEQ PO TBCR
40.0000 meq | EXTENDED_RELEASE_TABLET | Freq: Once | ORAL | Status: AC
  Administered 2019-04-29: 40 meq via ORAL
  Filled 2019-04-29: qty 2

## 2019-04-29 NOTE — Care Management Obs Status (Signed)
[facility] NOTIFICATION   Patient Details  Name: Dale [name] MRN: [id number] Date of Birth: 07-Nov-1946   Medicare Observation Status Notification Given:  Yes    Joaquin Courts, RN 04/29/2019, 9:24 AM

## 2019-04-29 NOTE — TOC Initial Note (Signed)
Transition of Care [facility]) - Initial/Assessment Note    Patient Details  Name: Dale [name] MRN: [id number] Date of Birth: [birth year]  Transition of Care [facility]) CM/SW Contact:    Joaquin Courts, RN Phone Number: 04/29/2019, 10:06 AM  Clinical Narrative: Cm spoke with patient who reports he has appointment set up for outpatient physical therapy. Reports has a RW at home, declines 3-in-1.                     Patient Goals and CMS Choice        Expected Discharge Plan and Services           Expected Discharge Date: 04/29/19                                    Prior Living Arrangements/Services                       Activities of Daily Living Home Assistive Devices/Equipment: [name] (specify type), Cane (specify quad or straight), Blood pressure cuff, Eyeglasses, Shower chair with back, Raised toilet seat with rails(Front wheel walker, straight cane, Prescription eyeglasses) ADL Screening (condition at time of admission) Patient's cognitive ability adequate to safely complete daily activities?: Yes Is the patient deaf or have difficulty hearing?: No Does the patient have difficulty seeing, even when wearing glasses/contacts?: No Does the patient have difficulty concentrating, remembering, or making decisions?: No Patient able to express need for assistance with ADLs?: Yes Does the patient have difficulty dressing or bathing?: No Independently performs ADLs?: Yes (appropriate for developmental age) Does the patient have difficulty walking or climbing stairs?: No Weakness of Legs: None Weakness of Arms/Hands: None  Permission Sought/Granted                  Emotional Assessment              Admission diagnosis:  Right knee osteoarthritis Patient Active Problem List   Diagnosis Date Noted  . S/P right TKA 04/28/2019  . S/P left TKA 09/16/2018  . 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 DDD (degenerative disc disease),  lumbosacral 07/10/2017  . Unilateral primary osteoarthritis, left knee 02/20/2017  . Benign neoplasm of colon 03/02/2014  . Premature ventricular contractions 01/04/2014  . Hypercholesteremia   . GERD (gastroesophageal reflux disease)   . HTN (hypertension)   . ED (erectile dysfunction)   . Allergic rhinitis   . Exertional angina (HCC) 05/01/2013    Class: Acute  . Abnormal nuclear stress test 05/01/2013    Class: Acute   PCP:  [name], MD Pharmacy:   [id number] - [place], [place] - [address] [place] [address] Phone: [phone] Fax: [phone]  [place], [place] [facility] [address] [place] [address] [place] [address] Phone: [phone] Fax: [phone]     Social Determinants of Health (SDOH) Interventions    Readmission Risk Interventions No flowsheet data found.

## 2019-04-29 NOTE — Progress Notes (Signed)
     Subjective: 1 Day Post-Op Procedure(s) (LRB): TOTAL KNEE ARTHROPLASTY (Right)   Patient reports pain as mild, pain controlled. No events throughout the night. Discussed renal func and d/c'ed Celebrex.  Add some potassium. Ready to be discharged home if he does well with PT.   Patient's anticipated LOS is less than 2 midnights, meeting these requirements: - Lives within 1 hour of care - Has a competent adult at home to recover with post-op recover - NO history of  - Chronic pain requiring opiods  - Diabetes  - Coronary Artery Disease  - Heart failure  - Heart attack  - Stroke  - DVT/VTE  - Cardiac arrhythmia  - Respiratory Failure/COPD  - Renal failure  - Anemia  - Advanced Liver disease       Objective:   VITALS:   Vitals:   04/29/19 0442 04/29/19 0844  BP: (!) 142/67 137/62  Pulse: (!) 54 60  Resp: 14   Temp: 97.7 F (36.5 C)   SpO2: 95%     Dorsiflexion/Plantar flexion intact Incision: dressing C/D/I No cellulitis present Compartment soft  LABS Recent Labs    04/29/19 0327  HGB 14.6  HCT 43.9  WBC 14.7*  PLT 167    Recent Labs    04/29/19 0327  NA 135  K 3.5  BUN 25*  CREATININE 1.58*  GLUCOSE 127*     Assessment/Plan: 1 Day Post-Op Procedure(s) (LRB): TOTAL KNEE ARTHROPLASTY (Right) Foley cath d/c'ed Advance diet Up with therapy D/C IV fluids Discharge home Follow up in 2 weeks at [facility] ([place]). Follow up with OLIN,Devell Parkerson D in 2 weeks.  Contact information:  [facility]) [address], [address] [place]. [place] Student   PAC  04/29/2019, 8:55 AM

## 2019-04-29 NOTE — Progress Notes (Signed)
Physical Therapy Treatment Patient Details Name: Dale [name] MRN: [id number] DOB: [birth year] Today's Date: 04/29/2019    History of Present Illness 73 yo male s/p R TKA 04/28/19. Hx of L TKA 09/2018    PT Comments    Reviewed exercises, gait training, and stair training. Pt is familiar with exercises from having L knee done recently-he will use his HEP he received from OP PT. He preferred to perform stair negotiation in his own manner. All education completed. Okay to d/c from PT standpoint-made RN aware.     Follow Up Recommendations  Follow surgeon's recommendation for DC plan and follow-up therapies     Equipment Recommendations  None recommended by PT    Recommendations for Other Services       Precautions / Restrictions Precautions Precautions: Fall Restrictions Weight Bearing Restrictions: No Other Position/Activity Restrictions: WBAT    Mobility  Bed Mobility               General bed mobility comments: oob in recliner  Transfers Overall transfer level: Needs assistance Equipment used: Rolling walker (2 wheeled) Transfers: Sit to/from Stand Sit to Stand: Supervision         General transfer comment: VCs safety, hand placement.   Ambulation/Gait Ambulation/Gait assistance: Supervision Gait Distance (Feet): 150 Feet Assistive device: Rolling walker (2 wheeled) Gait Pattern/deviations: Step-through pattern;Decreased stride length     General Gait Details: cloe guard for safety. Mild pain with ambulation.    Stairs Stairs: Yes Stairs assistance: Min assist Stair Management: Forwards;With walker;Step to pattern Number of Stairs: 2 General stair comments: VCs safety, techniqiue, sequence. Pt preferred to ascend/descend stairs forwards (in his preferred manner). Assist to manage walker and steady pt.    Wheelchair Mobility    Modified Rankin (Stroke Patients Only)       Balance Overall balance assessment: Mild deficits observed,  not formally tested                                          Cognition Arousal/Alertness: Awake/alert Behavior During Therapy: WFL for tasks assessed/performed Overall Cognitive Status: Within Functional Limits for tasks assessed                                        Exercises Total Joint Exercises Ankle Circles/Pumps: AROM;Both;10 reps;Seated Quad Sets: AROM;Both;10 reps;Seated Hip ABduction/ADduction: AROM;Right;10 reps;Seated Straight Leg Raises: AROM;Right;10 reps;Supine Knee Flexion: AAROM;Right;5 reps;Seated Goniometric ROM: ~5-95 degrees    General Comments        Pertinent Vitals/Pain Pain Assessment: 0-10 Pain Score: 7  Pain Location: R knee Pain Descriptors / Indicators: Aching;Sore Pain Intervention(s): Monitored during session;Repositioned;Ice applied    Home Living                      Prior Function            PT Goals (current goals can now be found in the care plan section) Progress towards PT goals: Progressing toward goals    Frequency    7X/week      PT Plan Current plan remains appropriate    Co-evaluation              AM-PAC PT "6 Clicks" Mobility   Outcome Measure  Help needed turning from your back to your  side while in a flat bed without using bedrails?: A Little Help needed moving from lying on your back to sitting on the side of a flat bed without using bedrails?: A Little Help needed moving to and from a bed to a chair (including a wheelchair)?: A Little Help needed standing up from a chair using your arms (e.g., wheelchair or bedside chair)?: A Little Help needed to walk in hospital room?: A Little Help needed climbing 3-5 steps with a railing? : A Little 6 Click Score: 18    End of Session Equipment Utilized During Treatment: Gait belt Activity Tolerance: Patient tolerated treatment well Patient left: in chair;with call bell/phone within reach;with chair alarm set   Pain -  Right/Left: Right Pain - part of body: Knee     Time: 0300-9233 PT Time Calculation (min) (ACUTE ONLY): 26 min  Charges:  $Gait Training: 8-22 mins $Therapeutic Exercise: 8-22 mins                        [name], [place] Pager: [phone] Office: [phone]

## 2019-04-30 NOTE — Discharge Summary (Signed)
Physician Discharge Summary  Patient ID: Dale [name] MRN: [id number] DOB/AGE: 1946/10/17 73 y.o.  Admit date: 04/28/2019 Discharge date: 04/29/2019   Procedures:  Procedure(s) (LRB): TOTAL KNEE ARTHROPLASTY (Right)  Attending Physician:  Dr. [name]   Admission Diagnoses:   Right knee primary OA / pain  Discharge Diagnoses:  Principal Problem:   S/P right TKA  Past Medical History:  Diagnosis Date  . Allergic rhinitis   . BPH (benign prostatic hyperplasia)   . ED (erectile dysfunction)   . Exertional angina [facility])    cardiologist-  [name]---  04-21-2013 abnormal myoview with epicardial coronaries ;  per cardiac cath 05-01-2013 no sig. obstructive CAD (LAD calcification, diffuse plaqueing) ;  evidence dRCA and LV branch vasoconstriction  . GERD (gastroesophageal reflux disease)   . History of adenomatous polyp of colon   . History of basal cell carcinoma (BCC) excision   . History of squamous cell carcinoma excision    09/ 27/ 2019  right lower leg   . HTN (hypertension)   . Hypercholesteremia   . Hypogonadism in male   . OA (osteoarthritis)    knees, lumbar, fingers  . Seasonal allergies   . Skin cancer, basal cell   . Systolic murmur     HPI:     Dale [name], 73 y.o. male, has a history of pain and functional disability in the right knee due to arthritis and has failed non-surgical conservative treatments for greater than 12 weeks to include NSAID's and/or analgesics, corticosteriod injections, use of assistive devices and activity modification.  Onset of symptoms was gradual, starting >10 years ago with gradually worsening course since that time. The patient noted prior procedures on the knee to include  arthroplasty on the left knee on 09/16/2018.  Patient currently rates pain in the right knee(s) at 8 out of 10 with activity. Patient has worsening of pain with activity and weight bearing, pain that interferes with activities of daily  living, pain with passive range of motion, crepitus and joint swelling.  Patient has evidence of periarticular osteophytes and joint space narrowing by imaging studies.  There is no active infection.  Risks, benefits and expectations were discussed with the patient.  Risks including but not limited to the risk of anesthesia, blood clots, nerve damage, blood vessel damage, failure of the prosthesis, infection and up to and including death.  Patient understand the risks, benefits and expectations and wishes to proceed with surgery.   PCP: [name], MD   Discharged Condition: good  Hospital Course:  Patient underwent the above stated procedure on 04/28/2019. Patient tolerated the procedure well and brought to the recovery room in good condition and subsequently to the floor.  POD #1 BP: 137/62 ; Pulse: 60 ; Temp: 97.7 F (36.5 C) ; Resp: 14 Patient reports pain as mild, pain controlled. No events throughout the night. Discussed renal func and d/c'ed Celebrex.  Add some potassium. Ready to be discharged home. Dorsiflexion/plantar flexion intact, incision: dressing C/D/I, no cellulitis present and compartment soft.    LABS  Basename    HGB     14.6  HCT     43.9    Discharge Exam: General appearance: alert, cooperative and no distress Extremities: Homans sign is negative, no sign of DVT, no edema, redness or tenderness in the calves or thighs and no ulcers, gangrene or trophic changes  Disposition:  Home with follow up in 2 weeks   Follow-up Information  [name], MD. Schedule an appointment as soon as possible for a visit in 2 weeks.   Specialty:  Orthopedic Surgery Contact information: [address] [place] [address] [phone]           Discharge Instructions    Call MD / Call 911   Complete by:  As directed    If you experience chest pain or shortness of breath, CALL 911 and be transported to the hospital emergency room.  If you develope  a fever above 101 F, pus (white drainage) or increased drainage or redness at the wound, or calf pain, call your surgeon's office.   Change dressing   Complete by:  As directed    Maintain surgical dressing until follow up in the clinic. If the edges start to pull up, may reinforce with tape. If the dressing is no longer working, may remove and cover with gauze and tape, but must keep the area dry and clean.  Call with any questions or concerns.   Constipation Prevention   Complete by:  As directed    Drink plenty of fluids.  Prune juice may be helpful.  You may use a stool softener, such as Colace (over the counter) 100 mg twice a day.  Use MiraLax (over the counter) for constipation as needed.   Diet - low sodium heart healthy   Complete by:  As directed    Discharge instructions   Complete by:  As directed    Maintain surgical dressing until follow up in the clinic. If the edges start to pull up, may reinforce with tape. If the dressing is no longer working, may remove and cover with gauze and tape, but must keep the area dry and clean.  Follow up in 2 weeks at [facility]. Call with any questions or concerns.   Increase activity slowly as tolerated   Complete by:  As directed    Weight bearing as tolerated with assist device (walker, cane, etc) as directed, use it as long as suggested by your surgeon or therapist, typically at least 4-6 weeks.   TED hose   Complete by:  As directed    Use stockings (TED hose) for 2 weeks on both leg(s).  You may remove them at night for sleeping.      Allergies as of 04/29/2019      Reactions   Latex Itching   redness   Penicillins Itching, Rash   Fever. TOLERATED CEPHALEXIN. Has patient had a PCN reaction causing immediate rash, facial/tongue/throat swelling, SOB or lightheadedness with hypotension: No Has patient had a PCN reaction causing severe rash involving mucus membranes or skin necrosis: No Has patient had a PCN reaction that  required hospitalization: No Has patient had a PCN reaction occurring within the last 10 years: No If all of the above answers are "NO", then may proceed with Cephalosporin use.   Pneumococcal Vaccines Swelling, Rash, Other (See Comments)   Low grade fever      Medication List    STOP taking these medications   aspirin EC 81 MG tablet Replaced by:  aspirin 81 MG chewable tablet     TAKE these medications   aspirin 81 MG chewable tablet Commonly known as:  Aspirin Childrens Chew 1 tablet (81 mg total) by mouth 2 (two) times daily for 30 days. Take for 4 weeks, then resume regular dose. Replaces:  aspirin EC 81 MG tablet   atorvastatin 10 MG tablet Commonly known as:  LIPITOR Take  10 mg by mouth at bedtime.   B-D 3CC LUER-LOK SYR 22GX1" 22G X 1" 3 ML Misc Generic drug:  SYRINGE-NEEDLE (DISP) 3 ML See admin instructions.   cholecalciferol 1000 units tablet Commonly known as:  VITAMIN D Take 1,000 Units by mouth daily.   colchicine 0.6 MG tablet Take 0.6-1.2 mg by mouth See admin instructions. Take 2 tablets at onset of gout attack then 1 tablet daily as needed for pain   docusate sodium 100 MG capsule Commonly known as:  Colace Take 1 capsule (100 mg total) by mouth 2 (two) times daily. What changed:    when to take this  reasons to take this   ferrous sulfate 325 (65 FE) MG tablet Commonly known as:  FerrouSul Take 1 tablet (325 mg total) by mouth 3 (three) times daily with meals.   fluticasone 50 MCG/ACT nasal spray Commonly known as:  FLONASE Place 1 spray into the nose daily as needed for rhinitis or allergies.   HYDROcodone-acetaminophen 7.5-325 MG tablet Commonly known as:  Norco Take 1-2 tablets by mouth every 4 (four) hours as needed for moderate pain.   hydrocortisone cream 1 % Apply 1 application topically daily as needed for itching.   lisinopril-hydrochlorothiazide 20-25 MG tablet Commonly known as:  ZESTORETIC Take 1 tablet by mouth daily.    methocarbamol 500 MG tablet Commonly known as:  Robaxin Take 1 tablet (500 mg total) by mouth every 6 (six) hours as needed for muscle spasms.   metoprolol succinate 50 MG 24 hr tablet Commonly known as:  TOPROL-XL TAKE 1 TABLET (50 MG TOTAL) BY MOUTH DAILY.   nitroGLYCERIN 0.4 MG SL tablet Commonly known as:  NITROSTAT Place 0.4 mg under the tongue every 5 (five) minutes as needed for chest pain.   pantoprazole 40 MG tablet Commonly known as:  PROTONIX Take 40 mg by mouth daily as needed (heartburn).   polyethylene glycol 17 g packet Commonly known as:  MIRALAX / GLYCOLAX Take 17 g by mouth 2 (two) times daily.   sertraline 50 MG tablet Commonly known as:  ZOLOFT Take 50 mg by mouth every morning.   sildenafil 20 MG tablet Commonly known as:  REVATIO Take 40 mg by mouth daily as needed (ED).   testosterone cypionate 200 MG/ML injection Commonly known as:  DEPOTESTOSTERONE CYPIONATE Inject 100 mg into the muscle once a week.   TUMS E-X PO Take 2 tablets by mouth daily as needed (heartburn).            Discharge Care Instructions  (From admission, onward)         Start     Ordered   04/29/19 0000  Change dressing    Comments:  Maintain surgical dressing until follow up in the clinic. If the edges start to pull up, may reinforce with tape. If the dressing is no longer working, may remove and cover with gauze and tape, but must keep the area dry and clean.  Call with any questions or concerns.   04/29/19 0857           Signed: [place] P[place]. Annaleise Burger   PA-C  04/30/2019, 10:27 AM

## 2019-09-17 ENCOUNTER — Other Ambulatory Visit: Payer: Self-pay | Admitting: Urology

## 2019-09-17 DIAGNOSIS — R972 Elevated prostate specific antigen [PSA]: Secondary | ICD-10-CM

## 2019-10-06 NOTE — Progress Notes (Signed)
Cardiology Office Note:    Date:  10/07/2019   ID:  Dale [name], DOB October 05, 1946, MRN [id number]  PCP:  [name], MD  Cardiologist:  [name], MD   Referring MD: [name], MD   Chief Complaint  Patient presents with  . Coronary Artery Disease  . Hypertension    History of Present Illness:    Dale [name] is a 73 y.o. male with a hx of  angina pectoris with abnormal nuclear study with inferolateral and apical ischemia, nonobstructive  epicardial coronaries, hypertension, hyperlipidemia, and resting bradycardia.   He is doing well.  He is status post bilateral knee replacements since I saw him last.  He is now starting to play pickle ball.  Physical activity with increased heart rate does not cause chest discomfort.  He denies orthopnea, PND, and use of nitroglycerin.  There is no lower extremity swelling.  Overall he feels well.  Past Medical History:  Diagnosis Date  . Allergic rhinitis   . BPH (benign prostatic hyperplasia)   . ED (erectile dysfunction)   . Exertional angina [facility])    cardiologist-  [name]---  04-21-2013 abnormal myoview with epicardial coronaries ;  per cardiac cath 05-01-2013 no sig. obstructive CAD (LAD calcification, diffuse plaqueing) ;  evidence dRCA and LV branch vasoconstriction  . GERD (gastroesophageal reflux disease)   . History of adenomatous polyp of colon   . History of basal cell carcinoma (BCC) excision   . History of squamous cell carcinoma excision    09/ 27/ 2019  right lower leg   . HTN (hypertension)   . Hypercholesteremia   . Hypogonadism in male   . OA (osteoarthritis)    knees, lumbar, fingers  . Seasonal allergies   . Skin cancer, basal cell   . Systolic murmur     Past Surgical History:  Procedure Laterality Date  . APPENDECTOMY  age 46  . CARDIAC CATHETERIZATION    . CATARACT EXTRACTION W/ INTRAOCULAR LENS  IMPLANT, BILATERAL  07/2018  . COLONOSCOPY N/A 03/02/2014   Procedure: COLONOSCOPY;  Surgeon: [name], MD;  Location: [facility];  Service: Endoscopy;  Laterality: N/A;  . FINGER SURGERY     11-14-2004 right long finger;  12-19-2007 left index finger (both done by dr sypher)  . HOT HEMOSTASIS N/A 03/02/2014   Procedure: HOT HEMOSTASIS (ARGON PLASMA COAGULATION/BICAP);  Surgeon: [name], MD;  Location: [facility];  Service: Endoscopy;  Laterality: N/A;  . INGUINAL HERNIA REPAIR Bilateral left , early 2000s;  right 12/ 2003  . KNEE ARTHROSCOPY Bilateral right- last one 1996;  left , last one ,yrs ago  . KNEE SURGERY Left 1972   open  . LEFT HEART CATHETERIZATION WITH CORONARY ANGIOGRAM N/A 05/01/2013   Procedure: LEFT HEART CATHETERIZATION WITH CORONARY ANGIOGRAM;  Surgeon: [name], MD;  Location: [facility] CATH LAB;  Service: Cardiovascular;  Laterality: N/A;  . MOHS SURGERY  yrs ago   nose  . RECONSTRUCTION OF NOSE  1968  . TOTAL KNEE ARTHROPLASTY Left 09/16/2018   Procedure: LEFT TOTAL KNEE ARTHROPLASTY;  Surgeon: [name], MD;  Location: [facility];  Service: Orthopedics;  Laterality: Left;  70 mins  . TOTAL KNEE ARTHROPLASTY Right 04/28/2019   Procedure: TOTAL KNEE ARTHROPLASTY;  Surgeon: [name], MD;  Location: [facility];  Service: Orthopedics;  Laterality: Right;  70 mins    Current Medications: Current Meds  Medication Sig  . aspirin EC 81 MG  tablet Take 81 mg by mouth daily.  Marland Kitchen atorvastatin (LIPITOR) 10 MG tablet Take 10 mg by mouth at bedtime.  . B-D 3CC LUER-LOK SYR 22GX1" 22G X 1" 3 ML MISC See admin instructions.  . Calcium Carbonate Antacid (TUMS E-X PO) Take 2 tablets by mouth daily as needed (heartburn).   . cholecalciferol (VITAMIN D) 1000 units tablet Take 1,000 Units by mouth daily.   . colchicine 0.6 MG tablet Take 0.6-1.2 mg by mouth See admin instructions. Take 2 tablets at onset of gout attack then 1 tablet daily as needed for pain  . fluticasone (FLONASE) 50 MCG/ACT nasal spray Place 1 spray  into the nose daily as needed for rhinitis or allergies.  Marland Kitchen HYDROcodone-acetaminophen (NORCO) 7.5-325 MG tablet Take 1-2 tablets by mouth every 4 (four) hours as needed for moderate pain.  . hydrocortisone cream 1 % Apply 1 application topically daily as needed for itching.   Marland Kitchen lisinopril-hydrochlorothiazide (ZESTORETIC) 20-25 MG tablet TAKE 1 TABLET BY MOUTH EVERY DAY  . methocarbamol (ROBAXIN) 500 MG tablet Take 1 tablet (500 mg total) by mouth every 6 (six) hours as needed for muscle spasms.  . metoprolol succinate (TOPROL-XL) 50 MG 24 hr tablet TAKE 1 TABLET (50 MG TOTAL) BY MOUTH DAILY.  . nitroGLYCERIN (NITROSTAT) 0.4 MG SL tablet Place 0.4 mg under the tongue every 5 (five) minutes as needed for chest pain.  . pantoprazole (PROTONIX) 40 MG tablet Take 40 mg by mouth daily as needed (heartburn).   . polyethylene glycol (MIRALAX / GLYCOLAX) 17 g packet Take 17 g by mouth 2 (two) times daily.  . sertraline (ZOLOFT) 50 MG tablet Take 50 mg by mouth every morning.   . sildenafil (REVATIO) 20 MG tablet Take 40 mg by mouth daily as needed (ED).   . terbinafine (LAMISIL) 250 MG tablet Take 250 mg by mouth daily.  Marland Kitchen testosterone cypionate (DEPOTESTOSTERONE CYPIONATE) 200 MG/ML injection Inject 100 mg into the muscle once a week.      Allergies:   Latex, Penicillins, and Pneumococcal vaccines   Social History   Socioeconomic History  . Marital status: Married    Spouse name: Not on file  . Number of children: 2  . Years of education: Not on file  . Highest education level: Not on file  Occupational History  . Not on file  Social Needs  . Financial resource strain: Not on file  . Food insecurity    Worry: Not on file    Inability: Not on file  . Transportation needs    Medical: Not on file    Non-medical: Not on file  Tobacco Use  . Smoking status: Never Smoker  . Smokeless tobacco: Never Used  Substance and Sexual Activity  . Alcohol use: No  . Drug use: Never  . Sexual  activity: Not on file  Lifestyle  . Physical activity    Days per week: Not on file    Minutes per session: Not on file  . Stress: Not on file  Relationships  . Social Herbalist on phone: Not on file    Gets together: Not on file    Attends religious service: Not on file    Active member of club or organization: Not on file    Attends meetings of clubs or organizations: Not on file    Relationship status: Not on file  Other Topics Concern  . Not on file  Social History Narrative  . Not on file  Family History: The patient's family history includes Heart disease in his father; Hypertension in his mother; Stroke in his mother.  ROS:   Please see the history of present illness.    Right knee still gets somewhat sore if he is too active but this is improving.  All other systems reviewed and are negative.  EKGs/Labs/Other Studies Reviewed:    The following studies were reviewed today: No imaging or functional data recently.  EKG:  EKG sinus rhythm/bradycardia with poor R wave progression V1 through V3.  Nonspecific ST abnormality.  Recent Labs: 04/29/2019: BUN 25; Creatinine, Ser 1.58; Hemoglobin 14.6; Platelets 167; Potassium 3.5; Sodium 135  Recent Lipid Panel No results found for: CHOL, TRIG, HDL, CHOLHDL, VLDL, LDLCALC, LDLDIRECT  Physical Exam:    VS:  BP 124/68   Pulse (!) 59   Ht 5\' 7"  (1.702 m)   Wt 166 lb 12.8 oz (75.7 kg)   SpO2 97%   BMI 26.12 kg/m     Wt Readings from Last 3 Encounters:  10/07/19 166 lb 12.8 oz (75.7 kg)  04/28/19 165 lb 6 oz (75 kg)  04/22/19 165 lb 6 oz (75 kg)     GEN: Mildly overweight.. No acute distress HEENT: Normal NECK: No JVD. LYMPHATICS: No lymphadenopathy CARDIAC:  RRR without murmur, gallop, or edema. VASCULAR:  Normal Pulses. No bruits. RESPIRATORY:  Clear to auscultation without rales, wheezing or rhonchi  ABDOMEN: Soft, non-tender, non-distended, No pulsatile mass, MUSCULOSKELETAL: No deformity  SKIN:  Warm and dry NEUROLOGIC:  Alert and oriented x 3 PSYCHIATRIC:  Normal affect   ASSESSMENT:    1. Exertional angina (HCC)   2. Essential hypertension   3. Hypercholesteremia   4. Systolic murmur   5. Educated about COVID-19 virus infection    PLAN:    In order of problems listed above:  1. Nonexistent.  He is physically active.  No current issues. 2. Blood pressures well controlled. 3. LDL cholesterol target less than 70 if possible.  Currently taking atorvastatin 10 mg/day. 4. Systolic murmur is noted.  It is soft and unchanged compared to prior evaluation.'s felt to represent a flow murmur. 5. The 3W's are discussed and have been endorsed by the patient and his wife.  Cardiovascular clinical follow-up in 1 year.  Aerobic activity is encouraged.   Medication Adjustments/Labs and Tests Ordered: Current medicines are reviewed at length with the patient today.  Concerns regarding medicines are outlined above.  No orders of the defined types were placed in this encounter.  No orders of the defined types were placed in this encounter.   There are no Patient Instructions on file for this visit.   Signed, [name], MD  10/07/2019 4:52 PM    [place]

## 2019-10-07 ENCOUNTER — Ambulatory Visit (INDEPENDENT_AMBULATORY_CARE_PROVIDER_SITE_OTHER): Payer: Medicare Other | Admitting: Interventional Cardiology

## 2019-10-07 ENCOUNTER — Other Ambulatory Visit: Payer: Self-pay

## 2019-10-07 ENCOUNTER — Encounter: Payer: Self-pay | Admitting: Interventional Cardiology

## 2019-10-07 VITALS — BP 124/68 | HR 59 | Ht 67.0 in | Wt 166.8 lb

## 2019-10-07 DIAGNOSIS — R011 Cardiac murmur, unspecified: Secondary | ICD-10-CM | POA: Diagnosis not present

## 2019-10-07 DIAGNOSIS — E7800 Pure hypercholesterolemia, unspecified: Secondary | ICD-10-CM | POA: Diagnosis not present

## 2019-10-07 DIAGNOSIS — I10 Essential (primary) hypertension: Secondary | ICD-10-CM | POA: Diagnosis not present

## 2019-10-07 DIAGNOSIS — Z7189 Other specified counseling: Secondary | ICD-10-CM

## 2019-10-07 DIAGNOSIS — I208 Other forms of angina pectoris: Secondary | ICD-10-CM | POA: Diagnosis not present

## 2019-10-07 NOTE — Patient Instructions (Signed)

## 2019-10-19 ENCOUNTER — Other Ambulatory Visit: Payer: Self-pay

## 2019-10-19 ENCOUNTER — Ambulatory Visit
Admission: RE | Admit: 2019-10-19 | Discharge: 2019-10-19 | Disposition: A | Discharge status: Home / Self Care | Payer: Medicare Other | Source: Ambulatory Visit | Attending: Urology | Admitting: Urology

## 2019-10-19 DIAGNOSIS — R972 Elevated prostate specific antigen [PSA]: Secondary | ICD-10-CM

## 2019-10-19 IMAGING — MR MR PROSTATE WO/W CM
56 series · 56 of 56 positions shown · IV contrast (15 ml multihance)
Comparison: None.

CLINICAL DATA: Elevated PSA. Prostatomegaly. Benign biopsy
[DATE]

EXAM:
MR PROSTATE WITHOUT AND WITH CONTRAST
TECHNIQUE: Multiplanar multisequence MRI images were obtained of the pelvis
centered about the prostate. Pre and post contrast images were
obtained.
CONTRAST:  15mL MULTIHANCE GADOBENATE DIMEGLUMINE 529 MG/ML IV SOLN

[Series 4: T1 · axial · 8.0mm · 1.06mm/px · 1 of 28 slices shown (1 of 2)]
[im 1/28]
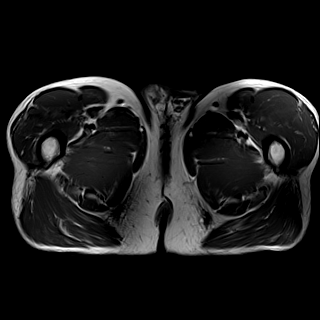

[Series 5: bSSFP fat-sat · axial · 8.0mm · 0.74mm/px · 1 of 28 slices shown]
[im 1/28]
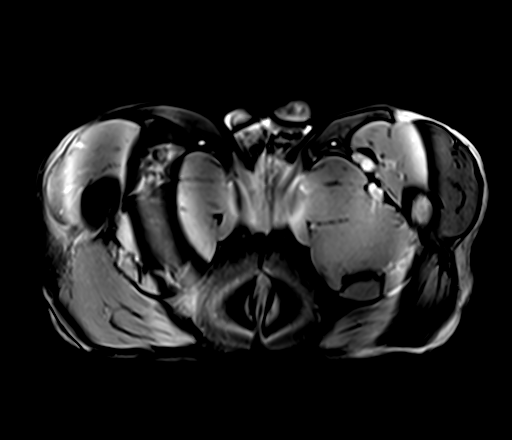

[Series 6: T2 · sagittal · 3.5mm · 0.56mm/px · 1 of 39 slices shown (1 of 4)]
[im 1/39]
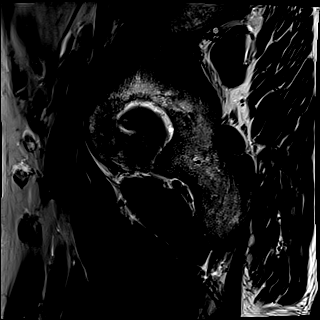

[Series 7: T1 · axial · 3.0mm · 0.31mm/px · 1 of 24 slices shown (2 of 2)]
[im 1/24]
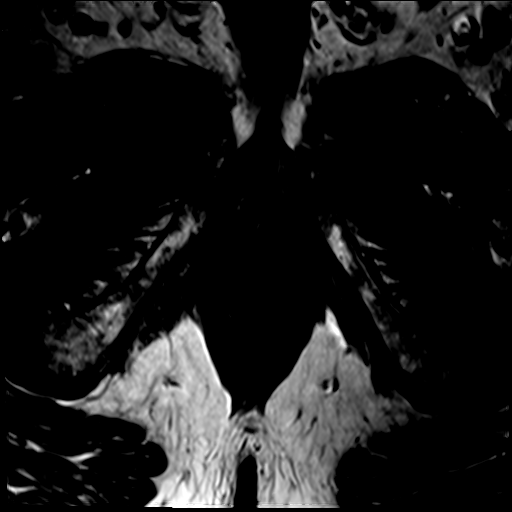

[Series 8: T2 · axial · 3.5mm · 0.56mm/px · 1 of 23 slices shown (2 of 4)]
[im 1/23]
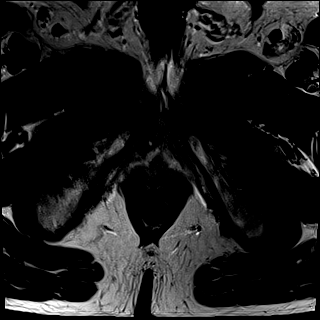

[Series 9: T2 · axial · 1.0mm · 1.04mm/px · 1 of 80 slices shown (3 of 4)]
[im 1/80]
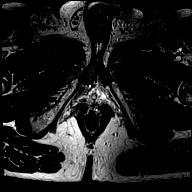

[Series 10: T2 · coronal · 3.5mm · 0.56mm/px · 1 of 23 slices shown (4 of 4)]
[im 1/23]
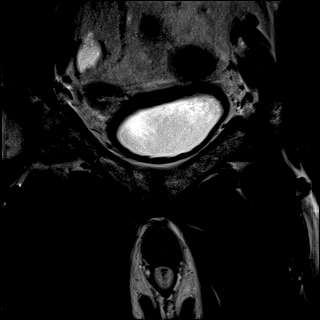

[Series 11: DWI · axial · 3.5mm · 1.56mm/px · 1 of 60 slices shown (1 of 2)]
[im 1/60]
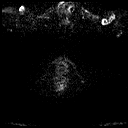

[Series 12: DWI · axial · 3.5mm · 1.56mm/px · 1 of 20 slices shown (2 of 2)]
[im 1/20]
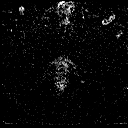

[Series 13: pre t1_twist_tra_dyn_ttc=5.3s · axial · non-contrast · 3.5mm · 0.83mm/px · 1 of 20 slices shown]
[im 1/20]
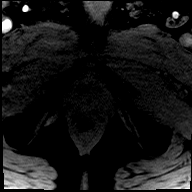

[Series 14: post t1_twist_tra_dyn-copy center · axial · 3.5mm · 0.83mm/px · 1 of 20 slices shown (1 of 24)]
[im 1/20]
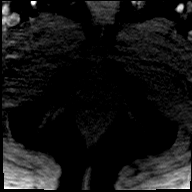

[Series 15: post t1_twist_tra_dyn-copy center · axial · 3.5mm · 0.83mm/px · 1 of 20 slices shown (2 of 24)]
[im 1/20]
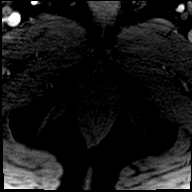

[Series 16: post t1_twist_tra_dyn-copy cent_sub_ttc=(id) · axial · 3.5mm · 0.83mm/px · 1 of 20 slices shown (1 of 22)]
[im 1/20]
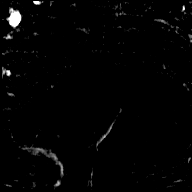

[Series 17: post t1_twist_tra_dyn-copy center · axial · 3.5mm · 0.83mm/px · 1 of 20 slices shown (3 of 24)]
[im 1/20]
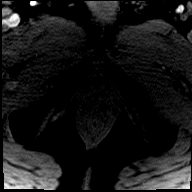

[Series 18: post t1_twist_tra_dyn-copy cent_sub_ttc=(id) · axial · 3.5mm · 0.83mm/px · 1 of 20 slices shown (2 of 22)]
[im 1/20]
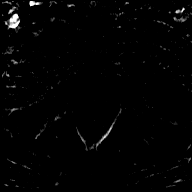

[Series 19: post t1_twist_tra_dyn-copy center · axial · 3.5mm · 0.83mm/px · 1 of 20 slices shown (4 of 24)]
[im 1/20]
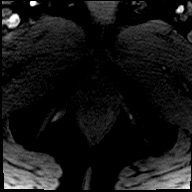

[Series 20: post t1_twist_tra_dyn-copy cent_sub_ttc=(id) · axial · 3.5mm · 0.83mm/px · 1 of 20 slices shown (3 of 22)]
[im 1/20]
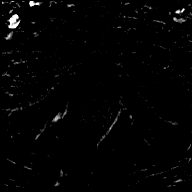

[Series 21: post t1_twist_tra_dyn-copy center · axial · 3.5mm · 0.83mm/px · 1 of 20 slices shown (5 of 24)]
[im 1/20]
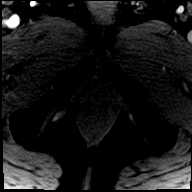

[Series 22: post t1_twist_tra_dyn-copy cent_sub_ttc=(id) · axial · 3.5mm · 0.83mm/px · 1 of 20 slices shown (4 of 22)]
[im 1/20]
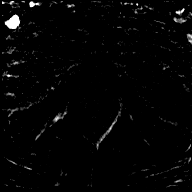

[Series 23: post t1_twist_tra_dyn-copy center · axial · 3.5mm · 0.83mm/px · 1 of 20 slices shown (6 of 24)]
[im 1/20]
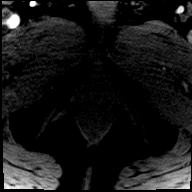

[Series 24: post t1_twist_tra_dyn-copy cent_sub_ttc=(id) · axial · 3.5mm · 0.83mm/px · 1 of 20 slices shown (5 of 22)]
[im 1/20]
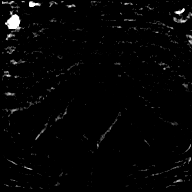

[Series 25: post t1_twist_tra_dyn-copy center · axial · 3.5mm · 0.83mm/px · 1 of 20 slices shown (7 of 24)]
[im 1/20]
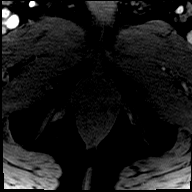

[Series 26: post t1_twist_tra_dyn-copy cent_sub_ttc=(id) · axial · 3.5mm · 0.83mm/px · 1 of 20 slices shown (6 of 22)]
[im 1/20]
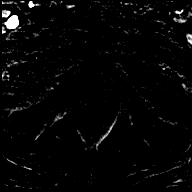

[Series 27: post t1_twist_tra_dyn-copy center · axial · 3.5mm · 0.83mm/px · 1 of 20 slices shown (8 of 24)]
[im 1/20]
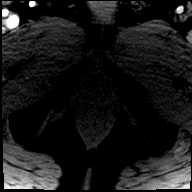

[Series 28: post t1_twist_tra_dyn-copy cent_sub_ttc=(id) · axial · 3.5mm · 0.83mm/px · 1 of 20 slices shown (7 of 22)]
[im 1/20]
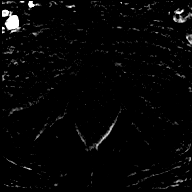

[Series 29: post t1_twist_tra_dyn-copy center · axial · 3.5mm · 0.83mm/px · 1 of 20 slices shown (9 of 24)]
[im 1/20]
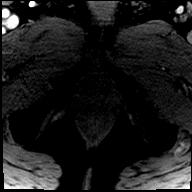

[Series 30: post t1_twist_tra_dyn-copy cent_sub_ttc=(id) · axial · 3.5mm · 0.83mm/px · 1 of 20 slices shown (8 of 22)]
[im 1/20]
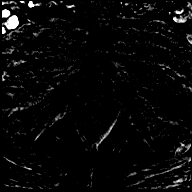

[Series 31: post t1_twist_tra_dyn-copy center · axial · 3.5mm · 0.83mm/px · 1 of 20 slices shown (10 of 24)]
[im 1/20]
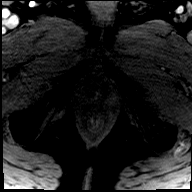

[Series 32: post t1_twist_tra_dyn-copy cent_sub_ttc=(id) · axial · 3.5mm · 0.83mm/px · 1 of 20 slices shown (9 of 22)]
[im 1/20]
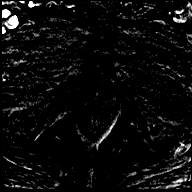

[Series 33: post t1_twist_tra_dyn-copy center · axial · 3.5mm · 0.83mm/px · 1 of 20 slices shown (11 of 24)]
[im 1/20]
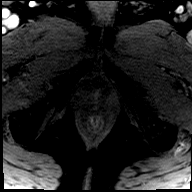

[Series 34: post t1_twist_tra_dyn-copy cent_sub_ttc=(id) · axial · 3.5mm · 0.83mm/px · 1 of 20 slices shown (10 of 22)]
[im 1/20]
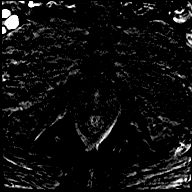

[Series 35: post t1_twist_tra_dyn-copy center · axial · 3.5mm · 0.83mm/px · 1 of 20 slices shown (12 of 24)]
[im 1/20]
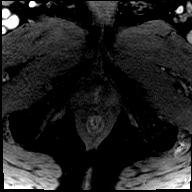

[Series 36: post t1_twist_tra_dyn-copy cent_sub_ttc=(id) · axial · 3.5mm · 0.83mm/px · 1 of 20 slices shown (11 of 22)]
[im 1/20]
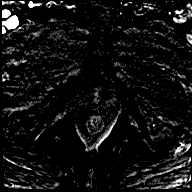

[Series 37: post t1_twist_tra_dyn-copy center · axial · 3.5mm · 0.83mm/px · 1 of 20 slices shown (13 of 24)]
[im 1/20]
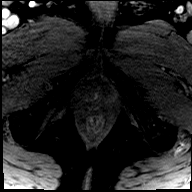

[Series 38: post t1_twist_tra_dyn-copy cent_sub_ttc=(id) · axial · 3.5mm · 0.83mm/px · 1 of 20 slices shown (12 of 22)]
[im 1/20]
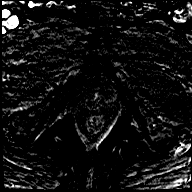

[Series 39: post t1_twist_tra_dyn-copy center · axial · 3.5mm · 0.83mm/px · 1 of 20 slices shown (14 of 24)]
[im 1/20]
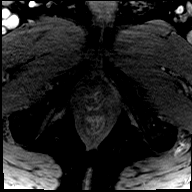

[Series 40: post t1_twist_tra_dyn-copy cent_sub_ttc=(id) · axial · 3.5mm · 0.83mm/px · 1 of 20 slices shown (13 of 22)]
[im 1/20]
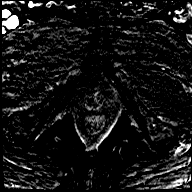

[Series 41: post t1_twist_tra_dyn-copy center · axial · 3.5mm · 0.83mm/px · 1 of 20 slices shown (15 of 24)]
[im 1/20]
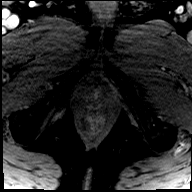

[Series 42: post t1_twist_tra_dyn-copy cent_sub_ttc=(id) · axial · 3.5mm · 0.83mm/px · 1 of 20 slices shown (14 of 22)]
[im 1/20]
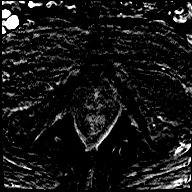

[Series 43: post t1_twist_tra_dyn-copy center · axial · 3.5mm · 0.83mm/px · 1 of 20 slices shown (16 of 24)]
[im 1/20]
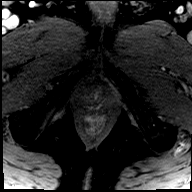

[Series 44: post t1_twist_tra_dyn-copy cent_sub_ttc=(id) · axial · 3.5mm · 0.83mm/px · 1 of 20 slices shown (15 of 22)]
[im 1/20]
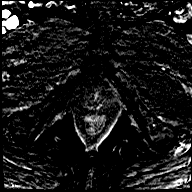

[Series 45: post t1_twist_tra_dyn-copy center · axial · 3.5mm · 0.83mm/px · 1 of 20 slices shown (17 of 24)]
[im 1/20]
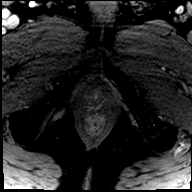

[Series 46: post t1_twist_tra_dyn-copy cent_sub_ttc=(id) · axial · 3.5mm · 0.83mm/px · 1 of 20 slices shown (16 of 22)]
[im 1/20]
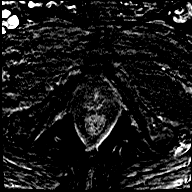

[Series 47: post t1_twist_tra_dyn-copy center · axial · 3.5mm · 0.83mm/px · 1 of 20 slices shown (18 of 24)]
[im 1/20]
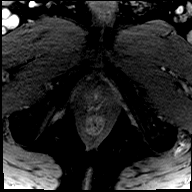

[Series 48: post t1_twist_tra_dyn-copy cent_sub_ttc=(id) · axial · 3.5mm · 0.83mm/px · 1 of 20 slices shown (17 of 22)]
[im 1/20]
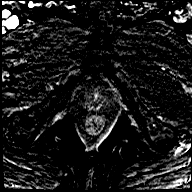

[Series 49: post t1_twist_tra_dyn-copy center · axial · 3.5mm · 0.83mm/px · 1 of 20 slices shown (19 of 24)]
[im 1/20]
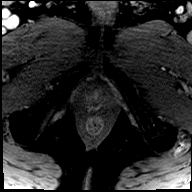

[Series 50: post t1_twist_tra_dyn-copy cent_sub_ttc=(id) · axial · 3.5mm · 0.83mm/px · 1 of 20 slices shown (18 of 22)]
[im 1/20]
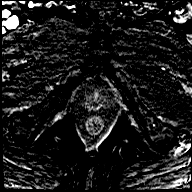

[Series 51: post t1_twist_tra_dyn-copy center · axial · 3.5mm · 0.83mm/px · 1 of 20 slices shown (20 of 24)]
[im 1/20]
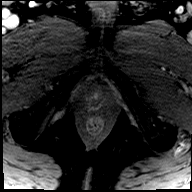

[Series 52: post t1_twist_tra_dyn-copy cent_sub_ttc=(id) · axial · 3.5mm · 0.83mm/px · 1 of 20 slices shown (19 of 22)]
[im 1/20]
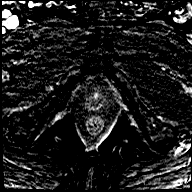

[Series 53: post t1_twist_tra_dyn-copy center · axial · 3.5mm · 0.83mm/px · 1 of 20 slices shown (21 of 24)]
[im 1/20]
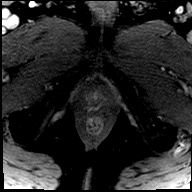

[Series 54: post t1_twist_tra_dyn-copy cent_sub_ttc=(id) · axial · 3.5mm · 0.83mm/px · 1 of 20 slices shown (20 of 22)]
[im 1/20]
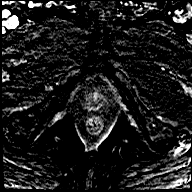

[Series 55: post t1_twist_tra_dyn-copy center · axial · 3.5mm · 0.83mm/px · 1 of 20 slices shown (22 of 24)]
[im 1/20]
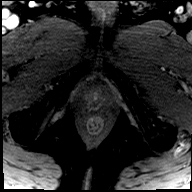

[Series 56: post t1_twist_tra_dyn-copy cent_sub_ttc=(id) · axial · 3.5mm · 0.83mm/px · 1 of 20 slices shown (21 of 22)]
[im 1/20]
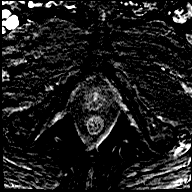

[Series 57: post t1_twist_tra_dyn-copy center · axial · 3.5mm · 0.83mm/px · 1 of 20 slices shown (23 of 24)]
[im 1/20]
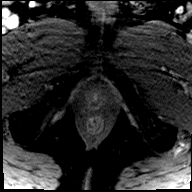

[Series 58: post t1_twist_tra_dyn-copy cent_sub_ttc=(id) · axial · 3.5mm · 0.83mm/px · 1 of 20 slices shown (22 of 22)]
[im 1/20]
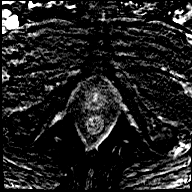

[Series 59: post t1_twist_tra_dyn-copy center · axial · 3.5mm · 0.83mm/px · 1 of 20 slices shown (24 of 24)]
[im 1/20]
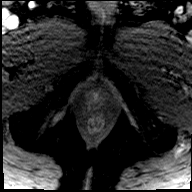

[56 of 56 positions shown; findings below may reference images not displayed]

FINDINGS: Prostate: The peripheral zone is markedly thinned by the enlarged
transitional zone. Normal high signal intensity within the
peripheral zone T2 weighted imaging. No foci of restricted
diffusion.

The transitional zone is enlarged by well capsulated and well
demarcated heterogeneous nodules typical of benign prostate
hypertrophy.

The prostatic capsule is intact.  Seminal vesicles are normal.

Volume: 6.1 x 5.4 by 5.8 cm (volume = 100 cm^3)

Transcapsular spread:  Absent

Seminal vesicle involvement: Absent

Neurovascular bundle involvement: Absent

Pelvic adenopathy: Absent

Bone metastasis: Absent

Other findings: None
IMPRESSION: 1. No high-grade carcinoma within the peripheral zone ( PI-RADS: 1)
2. Enlarged nodular transitional zone consistent with benign
prostate hypertrophy (PI-RADS:1)
3. Prostate hypertrophy.

## 2019-10-19 MED ORDER — GADOBENATE DIMEGLUMINE 529 MG/ML IV SOLN
15.0000 mL | Freq: Once | INTRAVENOUS | Status: AC | PRN
  Administered 2019-10-19: 15 mL via INTRAVENOUS

## 2020-01-22 ENCOUNTER — Ambulatory Visit: Payer: Medicare Other

## 2020-03-30 ENCOUNTER — Other Ambulatory Visit: Payer: Self-pay | Admitting: Interventional Cardiology

## 2020-05-04 ENCOUNTER — Other Ambulatory Visit: Payer: Self-pay

## 2020-05-04 ENCOUNTER — Encounter (HOSPITAL_COMMUNITY): Payer: Self-pay

## 2020-05-04 DIAGNOSIS — N401 Enlarged prostate with lower urinary tract symptoms: Secondary | ICD-10-CM | POA: Insufficient documentation

## 2020-05-04 DIAGNOSIS — Z7982 Long term (current) use of aspirin: Secondary | ICD-10-CM | POA: Insufficient documentation

## 2020-05-04 DIAGNOSIS — Z79899 Other long term (current) drug therapy: Secondary | ICD-10-CM | POA: Insufficient documentation

## 2020-05-04 DIAGNOSIS — Z96653 Presence of artificial knee joint, bilateral: Secondary | ICD-10-CM | POA: Diagnosis not present

## 2020-05-04 DIAGNOSIS — Z91040 Latex allergy status: Secondary | ICD-10-CM | POA: Insufficient documentation

## 2020-05-04 DIAGNOSIS — I10 Essential (primary) hypertension: Secondary | ICD-10-CM | POA: Diagnosis not present

## 2020-05-04 DIAGNOSIS — R339 Retention of urine, unspecified: Secondary | ICD-10-CM | POA: Diagnosis present

## 2020-05-04 NOTE — ED Triage Notes (Signed)
Arrived POv from home. Patient had indwelling catheter removed today and now reports urinary retention and spasms.

## 2020-05-05 ENCOUNTER — Emergency Department (HOSPITAL_COMMUNITY)
Admission: EM | Admit: 2020-05-05 | Discharge: 2020-05-05 | Disposition: A | Discharge status: Home / Self Care | Payer: Medicare Other | Attending: Emergency Medicine | Admitting: Emergency Medicine

## 2020-05-05 DIAGNOSIS — R339 Retention of urine, unspecified: Secondary | ICD-10-CM

## 2020-05-05 NOTE — Discharge Instructions (Addendum)
[facility] will contact you tomorrow (later today) with time and date of follow up appointment. You will need to keep the foley catheter in place until then.   Increase your Flomax to twice a day dosing, one in am and one in pm.   Return to the emergency department with any high fever, severe pain, recurrent retention or new concern.

## 2020-05-05 NOTE — ED Provider Notes (Signed)
[place] DEPT Provider Note   CSN: [id number] Arrival date & time: 05/04/20  2248     History Chief Complaint  Patient presents with  . Urinary Retention    Dale [name] is a 74 y.o. male.  Patient to ED with complaint of urinary retention. He has been seeing urology for UTI, currently on Bactrim, with a foley catheter for retention for the past week. Seen in the office this morning when the foley was removed. He reports he did well through today but has not been able to urinate since 8:00 pm tonight and is in significant discomfort. No fever, vomiting.   The history is provided by the patient. No language interpreter was used.       Past Medical History:  Diagnosis Date  . Allergic rhinitis   . BPH (benign prostatic hyperplasia)   . ED (erectile dysfunction)   . Exertional angina [facility])    cardiologist-  [name]---  04-21-2013 abnormal myoview with epicardial coronaries ;  per cardiac cath 05-01-2013 no sig. obstructive CAD (LAD calcification, diffuse plaqueing) ;  evidence dRCA and LV branch vasoconstriction  . GERD (gastroesophageal reflux disease)   . History of adenomatous polyp of colon   . History of basal cell carcinoma (BCC) excision   . History of squamous cell carcinoma excision    09/ 27/ 2019  right lower leg   . HTN (hypertension)   . Hypercholesteremia   . Hypogonadism in male   . OA (osteoarthritis)    knees, lumbar, fingers  . Seasonal allergies   . Skin cancer, basal cell   . Systolic murmur     Patient Active Problem List   Diagnosis Date Noted  . S/P right TKA 04/28/2019  . S/P left TKA 09/16/2018  . 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 DDD (degenerative disc disease), lumbosacral 07/10/2017  . Unilateral primary osteoarthritis, left knee 02/20/2017  . Benign neoplasm of colon 03/02/2014  . Premature ventricular contractions 01/04/2014  . Hypercholesteremia   . GERD (gastroesophageal reflux  disease)   . HTN (hypertension)   . ED (erectile dysfunction)   . Allergic rhinitis   . Exertional angina (HCC) 05/01/2013    Class: Acute  . Abnormal nuclear stress test 05/01/2013    Class: Acute    Past Surgical History:  Procedure Laterality Date  . APPENDECTOMY  age 12  . CARDIAC CATHETERIZATION    . CATARACT EXTRACTION W/ INTRAOCULAR LENS  IMPLANT, BILATERAL  07/2018  . COLONOSCOPY N/A 03/02/2014   Procedure: COLONOSCOPY;  Surgeon: [name], MD;  Location: [facility] END[facility];  Service: Endoscopy;  Laterality: N/A;  . FINGER SURGERY     11-14-2004 right long finger;  12-19-2007 left index finger (both done by dr sypher)  . HOT HEMOSTASIS N/A 03/02/2014   Procedure: HOT HEMOSTASIS (ARGON PLASMA COAGULATION/BICAP);  Surgeon: [name], MD;  Location: [facility];  Service: Endoscopy;  Laterality: N/A;  . INGUINAL HERNIA REPAIR Bilateral left , early 2000s;  right 12/ 2003  . KNEE ARTHROSCOPY Bilateral right- last one 1996;  left , last one ,yrs ago  . KNEE SURGERY Left 1972   open  . LEFT HEART CATHETERIZATION WITH CORONARY ANGIOGRAM N/A 05/01/2013   Procedure: LEFT HEART CATHETERIZATION WITH CORONARY ANGIOGRAM;  Surgeon: [name], MD;  Location: [facility] CATH LAB;  Service: Cardiovascular;  Laterality: N/A;  . MOHS SURGERY  yrs ago   nose  . RECONSTRUCTION OF NOSE  1968  .  TOTAL KNEE ARTHROPLASTY Left 09/16/2018   Procedure: LEFT TOTAL KNEE ARTHROPLASTY;  Surgeon: [name], MD;  Location: [facility] ORS;  Service: Orthopedics;  Laterality: Left;  70 mins  . TOTAL KNEE ARTHROPLASTY Right 04/28/2019   Procedure: TOTAL KNEE ARTHROPLASTY;  Surgeon: [name], MD;  Location: [facility];  Service: Orthopedics;  Laterality: Right;  70 mins       Family History  Problem Relation Age of Onset  . Heart disease Father   . Hypertension Mother   . Stroke Mother     Social History   Tobacco Use  . Smoking status: Never Smoker  . Smokeless tobacco: Never Used   Substance Use Topics  . Alcohol use: No  . Drug use: Never    Home Medications Prior to Admission medications   Medication Sig Start Date End Date Taking? Authorizing Provider  aspirin EC 81 MG tablet Take 81 mg by mouth daily.    [provider]  atorvastatin (LIPITOR) 10 MG tablet Take 10 mg by mouth at bedtime.    [provider]  B-D 3CC LUER-LOK SYR 22GX1" 22G X 1" 3 ML MISC See admin instructions. 01/23/17   [provider]  Calcium Carbonate Antacid (TUMS E-X PO) Take 2 tablets by mouth daily as needed (heartburn).     [provider]  cholecalciferol (VITAMIN D) 1000 units tablet Take 1,000 Units by mouth daily.     [provider]  colchicine 0.6 MG tablet Take 0.6-1.2 mg by mouth See admin instructions. Take 2 tablets at onset of gout attack then 1 tablet daily as needed for pain    [provider]  fluticasone (FLONASE) 50 MCG/ACT nasal spray Place 1 spray into the nose daily as needed for rhinitis or allergies.    [provider]  HYDROcodone-acetaminophen (NORCO) 7.5-325 MG tablet Take 1-2 tablets by mouth every 4 (four) hours as needed for moderate pain. 04/28/19   Danae Orleans, PA-C  hydrocortisone cream 1 % Apply 1 application topically daily as needed for itching.  01/11/17   [provider]  lisinopril-hydrochlorothiazide (ZESTORETIC) 20-25 MG tablet TAKE 1 TABLET BY MOUTH EVERY DAY 03/30/20   Belva Crome, MD  methocarbamol (ROBAXIN) 500 MG tablet Take 1 tablet (500 mg total) by mouth every 6 (six) hours as needed for muscle spasms. 04/28/19   Danae Orleans, PA-C  metoprolol succinate (TOPROL-XL) 50 MG 24 hr tablet TAKE 1 TABLET (50 MG TOTAL) BY MOUTH DAILY. 06/25/17   Belva Crome, MD  nitroGLYCERIN (NITROSTAT) 0.4 MG SL tablet Place 0.4 mg under the tongue every 5 (five) minutes as needed for chest pain.    [provider]  pantoprazole (PROTONIX) 40 MG tablet Take 40 mg by mouth daily as  needed (heartburn).     [provider]  polyethylene glycol (MIRALAX / GLYCOLAX) 17 g packet Take 17 g by mouth 2 (two) times daily. 04/28/19   Danae Orleans, PA-C  sertraline (ZOLOFT) 50 MG tablet Take 50 mg by mouth every morning.  11/29/16   [provider]  sildenafil (REVATIO) 20 MG tablet Take 40 mg by mouth daily as needed (ED).  08/21/18   [provider]  terbinafine (LAMISIL) 250 MG tablet Take 250 mg by mouth daily. 09/15/19   [provider]  testosterone cypionate (DEPOTESTOSTERONE CYPIONATE) 200 MG/ML injection Inject 100 mg into the muscle once a week.  02/05/17   [provider]    Allergies    Latex, Penicillins, and Pneumococcal  vaccines  Review of Systems   Review of Systems  Constitutional: Negative for chills and fever.  Cardiovascular: Negative.   Gastrointestinal: Positive for abdominal pain.  Genitourinary: Positive for difficulty urinating.       See HPI.  Musculoskeletal: Negative.  Negative for back pain.  Skin: Negative.   Neurological: Negative.     Physical Exam Updated Vital Signs BP (!) 171/97 (BP Location: Right Arm)   Pulse 80   Temp 97.9 F (36.6 C) (Oral)   Resp 18   Ht 5\' 7"  (1.702 m)   Wt 76.2 kg   SpO2 97%   BMI 26.31 kg/m   Physical Exam Vitals and nursing note reviewed.  Constitutional:      Appearance: He is well-developed.  Pulmonary:     Effort: Pulmonary effort is normal.  Abdominal:     General: There is distension.     Tenderness: There is abdominal tenderness.  Musculoskeletal:        General: Normal range of motion.     Cervical back: Normal range of motion.  Skin:    General: Skin is warm and dry.  Neurological:     Mental Status: He is alert and oriented to person, place, and time.     ED Results / Procedures / Treatments   Labs (all labs ordered are listed, but only abnormal results are displayed) Labs Reviewed - No data to display  EKG None  Radiology No  results found.  Procedures Procedures (including critical care time)  Medications Ordered in ED Medications - No data to display  ED Course  I have reviewed the triage vital signs and the nursing notes.  Pertinent labs & imaging results that were available during my care of the patient were reviewed by me and considered in my medical decision making (see chart for details).    MDM Rules/Calculators/A&P                      Patient to ED with recurrent urinary retention since this evening.   Foley catheter ordered for relief of pain. No UA necessary - already on Bactrim for Klebsiella UTI. Azzie Roup, NP, urology, provided information regarding ongoing patient care in her office who will provide follow up appointment in office. Per her recommendation, will increase Flomax to BID dosing.  Foley catheter easily inserted and draining well. Patient's symptoms significantly improved and he is having no pain now. He is felt appropriate for discharge home.   Final Clinical Impression(s) / ED Diagnoses Final diagnoses:  None   1. Urinary retention  Rx / DC Orders ED Discharge Orders    None       Charlann Lange, PA-C 05/05/20 0048    Orpah Greek, MD 05/05/20 [phone]

## 2020-06-02 DIAGNOSIS — M109 Gout, unspecified: Secondary | ICD-10-CM

## 2020-06-02 HISTORY — DX: Gout, unspecified: M10.9

## 2020-06-14 NOTE — H&P (View-Only) (Signed)
06/15/2020 4:21 PM   Dale [name] [birth year] [id number]  Referring provider: Franchot Gallo, MD [address] [place],  [place] [address] No chief complaint on file.   HPI: Dale [name] is a 74 y.o. male who present today for consultation regarding HOLEP.   The patient has been followed by Dr. Diona [name] and is being managed medically with Flomax but continues to have refractory symptoms is concerned about long-term side effects of Flomax. He is currently on double dose. He does occasionally also get dizzy when he takes it with his lisinopril.  He has a personal history of incomplete bladder emptying and recurrent urinary rentention and acute cystitis/ UTI.  He has a personal history of hypogonadism for which he took testosterone supplements.  He has ED due to arterial insufficiency, 100 mg sildenafil is not very effective for him. His sildenafil was increased from 40-50 mg to 60 mg. If not successful could consider Cialis or  PGD injections.   He has a long history of elevated PSA. On 09/15/2010: TRUS/Bx PSA was 4.17, volume 37 mL. PSAD 0.11. All bx at that time revealed benign prostatic tissue with 1 small focus on chronic inflammation.  On 10/04/2017: Repeat TRUS/Bx. PSA was 7.92, prostatic volume 81.7 mL. PSAD 0.096 11/12 cores revealed benign prostatic tissue with 2 slowing inflammation. 1 core (right mid lateral) revealed a small focus of atypical.   Most recent PSA was 11.0 on 06/2019, which is slightly elevated from previous measurement at 8.63. The patient had been receiving testosterone injections.   Underwent prostate MRI on 10/19/2019 which revealed no high-grade carcinoma within the peripheral zone ( PI-RADS: 1) Enlarged nodular transitional zone consistent with benign prostate hypertrophy (PI-RADS:1). Prostate hypertrophy.  He was currently on Tamsulosin BID. He is not on any BP medication.   PVR is 65 mL today.  Today the patient is accompanied by  his wife.   He remains on Flomax BID. He feels dizzy when he takes lisinopril along with Flomax.   He is on 81 mg aspirin.   PSA trend: 6.18, 07/24/2016 6.76, 11/02/2016 7.48, 05/09/2017 (23 % free) 7.92, 08/06/2017 (PCA 3 was positive at 39 (08/04/2017)) 7.92, 10/04/2017 11.0, 06/10/2019   IPSS    Row Name 06/15/20 1500         International Prostate Symptom Score   How often have you had the sensation of not emptying your bladder? Less than half the time     How often have you had to urinate less than every two hours? About half the time     How often have you found you stopped and started again several times when you urinated? More than half the time     How often have you found it difficult to postpone urination? About half the time     How often have you had a weak urinary stream? Almost always     How often have you had to strain to start urination? About half the time     How many times did you typically get up at night to urinate? 2 Times     Total IPSS Score 22       Quality of Life due to urinary symptoms   If you were to spend the rest of your life with your urinary condition just the way it is now how would you feel about that? Mostly Disatisfied            Score:  1-7 Mild 8-19 Moderate 20-35  Severe    PMH: Past Medical History:  Diagnosis Date  . Allergic rhinitis   . BPH (benign prostatic hyperplasia)   . ED (erectile dysfunction)   . Exertional angina [facility])    cardiologist-  dr Dale [name]---  04-21-2013 abnormal myoview with epicardial coronaries ;  per cardiac cath 05-01-2013 no sig. obstructive CAD (LAD calcification, diffuse plaqueing) ;  evidence dRCA and LV branch vasoconstriction  . GERD (gastroesophageal reflux disease)   . History of adenomatous polyp of colon   . History of basal cell carcinoma (BCC) excision   . History of squamous cell carcinoma excision    09/ 27/ 2019  right lower leg   . HTN (hypertension)   . Hypercholesteremia    . Hypogonadism in male   . OA (osteoarthritis)    knees, lumbar, fingers  . Seasonal allergies   . Skin cancer, basal cell   . Systolic murmur     Surgical History: Past Surgical History:  Procedure Laterality Date  . APPENDECTOMY  age 9  . CARDIAC CATHETERIZATION    . CATARACT EXTRACTION W/ INTRAOCULAR LENS  IMPLANT, BILATERAL  07/2018  . COLONOSCOPY N/A 03/02/2014   Procedure: COLONOSCOPY;  Surgeon: Dale Ng, MD;  Location: [facility];  Service: Endoscopy;  Laterality: N/A;  . FINGER SURGERY     11-14-2004 right long finger;  12-19-2007 left index finger (both done by dr Dale [name])  . HOT HEMOSTASIS N/A 03/02/2014   Procedure: HOT HEMOSTASIS (ARGON PLASMA COAGULATION/BICAP);  Surgeon: Dale Ng, MD;  Location: [facility];  Service: Endoscopy;  Laterality: N/A;  . INGUINAL HERNIA REPAIR Bilateral left , early 2000s;  right 12/ 2003  . KNEE ARTHROSCOPY Bilateral right- last one 1996;  left , last one ,yrs ago  . KNEE SURGERY Left 1972   open  . LEFT HEART CATHETERIZATION WITH CORONARY ANGIOGRAM N/A 05/01/2013   Procedure: LEFT HEART CATHETERIZATION WITH CORONARY ANGIOGRAM;  Surgeon: Dale Grooms, MD;  Location[facility] CATH LAB;  Service: Cardiovascular;  Laterality: N/A;  . MOHS SURGERY  yrs ago   nose  . RECONSTRUCTION OF NOSE  1968  . TOTAL KNEE ARTHROPLASTY Left 09/16/2018   Procedure: LEFT TOTAL KNEE ARTHROPLASTY;  Surgeon: Dale Cancel, MD;  Location: [facility];  Service: Orthopedics;  Laterality: Left;  70 mins  . TOTAL KNEE ARTHROPLASTY Right 04/28/2019   Procedure: TOTAL KNEE ARTHROPLASTY;  Surgeon: Dale Cancel, MD;  Location: [facility];  Service: Orthopedics;  Laterality: Right;  70 mins    Home Medications:  Allergies as of 06/15/2020      Reactions   Latex Itching   redness   Penicillins Itching, Rash   Fever. TOLERATED CEPHALEXIN. Has patient had a PCN reaction causing immediate rash, facial/tongue/throat swelling, SOB or lightheadedness with  hypotension: No Has patient had a PCN reaction causing severe rash involving mucus membranes or skin necrosis: No Has patient had a PCN reaction that required hospitalization: No Has patient had a PCN reaction occurring within the last 10 years: No If all of the above answers are "NO", then may proceed with Cephalosporin use.   Pneumococcal Vaccines Swelling, Rash, Other (See Comments)   Low grade fever      Medication List       Accurate as of June 15, 2020 11:59 PM. If you have any questions, ask your nurse or doctor.        STOP taking these medications   HYDROcodone-acetaminophen 7.5-325 MG tablet Commonly known as: Norco Stopped by:  Hollice Espy, MD   nitroGLYCERIN 0.4 MG SL tablet Commonly known as: NITROSTAT Stopped by: Hollice Espy, MD   polyethylene glycol 17 g packet Commonly known as: MIRALAX / GLYCOLAX Stopped by: Hollice Espy, MD   sertraline 50 MG tablet Commonly known as: ZOLOFT Stopped by: Hollice Espy, MD   sildenafil 20 MG tablet Commonly known as: REVATIO Stopped by: Hollice Espy, MD   terbinafine 250 MG tablet Commonly known as: LAMISIL Stopped by: Hollice Espy, MD     TAKE these medications   aspirin EC 81 MG tablet Take 81 mg by mouth daily.   atorvastatin 10 MG tablet Commonly known as: LIPITOR Take 10 mg by mouth at bedtime.   B-D 3CC LUER-LOK SYR 22GX1" 22G X 1" 3 ML Misc Generic drug: SYRINGE-NEEDLE (DISP) 3 ML See admin instructions.   cholecalciferol 1000 units tablet Commonly known as: VITAMIN D Take 1,000 Units by mouth daily.   colchicine 0.6 MG tablet Take 0.6-1.2 mg by mouth See admin instructions. Take 2 tablets at onset of gout attack then 1 tablet daily as needed for pain   fluticasone 50 MCG/ACT nasal spray Commonly known as: FLONASE Place 1 spray into the nose daily as needed for rhinitis or allergies.   hydrocortisone cream 1 % Apply 1 application topically daily as needed for itching.   ibuprofen  200 MG tablet Commonly known as: ADVIL Take 400 mg by mouth every 6 (six) hours as needed for moderate pain.   lisinopril-hydrochlorothiazide 20-25 MG tablet Commonly known as: ZESTORETIC TAKE 1 TABLET BY MOUTH EVERY DAY   methocarbamol 500 MG tablet Commonly known as: Robaxin Take 1 tablet (500 mg total) by mouth every 6 (six) hours as needed for muscle spasms.   metoprolol succinate 50 MG 24 hr tablet Commonly known as: TOPROL-XL TAKE 1 TABLET (50 MG TOTAL) BY MOUTH DAILY.   pantoprazole 40 MG tablet Commonly known as: PROTONIX Take 40 mg by mouth daily as needed (heartburn).   tamsulosin 0.4 MG Caps capsule Commonly known as: FLOMAX Take 0.4 mg by mouth at bedtime.   testosterone cypionate 200 MG/ML injection Commonly known as: DEPOTESTOSTERONE CYPIONATE Inject 100 mg into the muscle once a week.   TUMS E-X PO Take 2 tablets by mouth daily as needed (heartburn).       Allergies:  Allergies  Allergen Reactions  . Latex Itching    redness  . Penicillins Itching and Rash    Fever. TOLERATED CEPHALEXIN. Has patient had a PCN reaction causing immediate rash, facial/tongue/throat swelling, SOB or lightheadedness with hypotension: No Has patient had a PCN reaction causing severe rash involving mucus membranes or skin necrosis: No Has patient had a PCN reaction that required hospitalization: No Has patient had a PCN reaction occurring within the last 10 years: No If all of the above answers are "NO", then may proceed with Cephalosporin use.   . Pneumococcal Vaccines Swelling, Rash and Other (See Comments)    Low grade fever    Family History: Family History  Problem Relation Age of Onset  . Heart disease Father   . Hypertension Mother   . Stroke Mother     Social History:  reports that he has never smoked. He has never used smokeless tobacco. He reports that he does not drink alcohol and does not use drugs.   Physical Exam: BP (!) 177/78   Pulse 72    Constitutional:  Alert and oriented, No acute distress.   Accompanied by wife today. HEENT: [place] AT,  moist mucus membranes.  Trachea midline, no masses. Cardiovascular: No clubbing, cyanosis, or edema. Respiratory: Normal respiratory effort, no increased work of breathing. GI: Abdomen is soft, nontender, nondistended, no abdominal masses GU: No CVA tenderness Lymph: No cervical or inguinal lymphadenopathy. Skin: No rashes, bruises or suspicious lesions. Neurologic: Grossly intact, no focal deficits, moving all 4 extremities. Psychiatric: Normal mood and affect.   Pertinent Imaging: Results for orders placed or performed in visit on 06/15/20  BLADDER SCAN AMB NON-IMAGING  Result Value Ref Range   Scan Result 80ml     Assessment & Plan:    1. BPH with outlet obstruction Enlarged prostate is estimated at 100 g, refractory urinary symptoms including recurrent episodes of urinary retention and multiple UTIs  IPSS is 22, severe.  Discussed the treatment HoLEP   We reviewed the surgery in detail today including the preoperative, intraoperative, and postoperative course.  This will most likely be an outpatient procedure pending the degree of post op hematuria.  He will go home with catheter for a few days post op and will either be taught how to remove his own catheter or return to the office for catheter removal. Risk of bleeding, infection, damage surrounding structures, injury to the bladder/ urethral, bladder neck contracture, ureteral stricture, retrograde ejaculation, stress/ urge incontinence, exacerbation of irritative voiding symptoms were all discussed in detail.    Patient agreed to HoLEP and I will need clearance from his cardiologist since he is on blood thinners. Should hold off on 81 mg aspirin.   Given his dizziness, would cut back to Flomax once daily unless he starts to have recurrent urinary symptoms in the interim  Recommend cystoscopy to rule out any other underlying  issues as well as evaluate anatomy prior to surgery, is agreeable this.  2. History of incomplete bladder emptying/urinary retention  PVR is 65 mL today.  3. rUTI Likely related to related to #1. UA today. Culture sent.   4. History of elevated PSA He has undergone extensive work up (see above).  No evidence of malignancy Most recent PSA was 11.0 on 06/10/2019 Most recent MRI reassuring   [facility] [address] [place], [place] [address] [phone]  I, Selena Batten, am acting as a scribe for Dr. Hollice Espy.  I have reviewed the above documentation for accuracy and completeness, and I agree with the above.   Hollice Espy, MD  I spent 60 total minutes on the day of the encounter including pre-visit review of the medical record, face-to-face time with the patient, and post visit ordering of labs/imaging/tests. Greater than 50 pages of charts are reviewed along with imaging an extensive period of time was spent counseling the patient and his wife.

## 2020-06-14 NOTE — Progress Notes (Signed)
06/15/2020 4:21 PM   Dale [name] 1946-07-03 [id number]  Referring provider: Franchot Gallo, MD [address] [place],  [place] [address] No chief complaint on file.   HPI: Dale [name] is a 74 y.o. male who present today for consultation regarding HOLEP.   The patient has been followed by Dr. Diona Fanti and is being managed medically with Flomax but continues to have refractory symptoms is concerned about long-term side effects of Flomax. He is currently on double dose. He does occasionally also get dizzy when he takes it with his lisinopril.  He has a personal history of incomplete bladder emptying and recurrent urinary rentention and acute cystitis/ UTI.  He has a personal history of hypogonadism for which he took testosterone supplements.  He has ED due to arterial insufficiency, 100 mg sildenafil is not very effective for him. His sildenafil was increased from 40-50 mg to 60 mg. If not successful could consider Cialis or  PGD injections.   He has a long history of elevated PSA. On 09/15/2010: TRUS/Bx PSA was 4.17, volume 37 mL. PSAD 0.11. All bx at that time revealed benign prostatic tissue with 1 small focus on chronic inflammation.  On 10/04/2017: Repeat TRUS/Bx. PSA was 7.92, prostatic volume 81.7 mL. PSAD 0.096 11/12 cores revealed benign prostatic tissue with 2 slowing inflammation. 1 core (right mid lateral) revealed a small focus of atypical.   Most recent PSA was 11.0 on 06/2019, which is slightly elevated from previous measurement at 8.63. The patient had been receiving testosterone injections.   Underwent prostate MRI on 10/19/2019 which revealed no high-grade carcinoma within the peripheral zone ( PI-RADS: 1) Enlarged nodular transitional zone consistent with benign prostate hypertrophy (PI-RADS:1). Prostate hypertrophy.  He was currently on Tamsulosin BID. He is not on any BP medication.   PVR is 65 mL today.  Today the patient is accompanied by  his wife.   He remains on Flomax BID. He feels dizzy when he takes lisinopril along with Flomax.   He is on 81 mg aspirin.   PSA trend: 6.18, 07/24/2016 6.76, 11/02/2016 7.48, 05/09/2017 (23 % free) 7.92, 08/06/2017 (PCA 3 was positive at 39 (08/04/2017)) 7.92, 10/04/2017 11.0, 06/10/2019   IPSS    Row Name 06/15/20 1500         International Prostate Symptom Score   How often have you had the sensation of not emptying your bladder? Less than half the time     How often have you had to urinate less than every two hours? About half the time     How often have you found you stopped and started again several times when you urinated? More than half the time     How often have you found it difficult to postpone urination? About half the time     How often have you had a weak urinary stream? Almost always     How often have you had to strain to start urination? About half the time     How many times did you typically get up at night to urinate? 2 Times     Total IPSS Score 22       Quality of Life due to urinary symptoms   If you were to spend the rest of your life with your urinary condition just the way it is now how would you feel about that? Mostly Disatisfied            Score:  1-7 Mild 8-19 Moderate 20-35  Severe    PMH: Past Medical History:  Diagnosis Date   Allergic rhinitis    BPH (benign prostatic hyperplasia)    ED (erectile dysfunction)    Exertional angina [facility])    cardiologist-  [name]---  04-21-2013 abnormal myoview with epicardial coronaries ;  per cardiac cath 05-01-2013 no sig. obstructive CAD (LAD calcification, diffuse plaqueing) ;  evidence dRCA and LV branch vasoconstriction   GERD (gastroesophageal reflux disease)    History of adenomatous polyp of colon    History of basal cell carcinoma (BCC) excision    History of squamous cell carcinoma excision    09/ 27/ 2019  right lower leg    HTN (hypertension)    Hypercholesteremia      Hypogonadism in male    OA (osteoarthritis)    knees, lumbar, fingers   Seasonal allergies    Skin cancer, basal cell    Systolic murmur     Surgical History: Past Surgical History:  Procedure Laterality Date   APPENDECTOMY  age 61   CARDIAC CATHETERIZATION     CATARACT EXTRACTION W/ INTRAOCULAR LENS  IMPLANT, BILATERAL  07/2018   COLONOSCOPY N/A 03/02/2014   Procedure: COLONOSCOPY;  Surgeon: [name], MD;  Location: [facility];  Service: Endoscopy;  Laterality: N/A;   FINGER SURGERY     11-14-2004 right long finger;  12-19-2007 left index finger (both done by dr sypher)   HOT HEMOSTASIS N/A 03/02/2014   Procedure: HOT HEMOSTASIS (ARGON PLASMA COAGULATION/BICAP);  Surgeon: [name], MD;  Location: [facility];  Service: Endoscopy;  Laterality: N/A;   INGUINAL HERNIA REPAIR Bilateral left , early 2000s;  right 12/ 2003   KNEE ARTHROSCOPY Bilateral right- last one 1996;  left , last one ,yrs ago   [facility]   open   LEFT HEART CATHETERIZATION WITH CORONARY ANGIOGRAM N/A 05/01/2013   Procedure: LEFT HEART CATHETERIZATION WITH CORONARY ANGIOGRAM;  Surgeon: [name], MD;  Location: [facility] CATH LAB;  Service: Cardiovascular;  Laterality: N/A;   MOHS SURGERY  yrs ago   nose   RECONSTRUCTION OF NOSE  1968   TOTAL KNEE ARTHROPLASTY Left 09/16/2018   Procedure: LEFT TOTAL KNEE ARTHROPLASTY;  Surgeon: [name], MD;  Location: [facility];  Service: Orthopedics;  Laterality: Left;  70 mins   TOTAL KNEE ARTHROPLASTY Right 04/28/2019   Procedure: TOTAL KNEE ARTHROPLASTY;  Surgeon: [name], MD;  Location: [facility];  Service: Orthopedics;  Laterality: Right;  70 mins    Home Medications:  Allergies as of 06/15/2020      Reactions   Latex Itching   redness   Penicillins Itching, Rash   Fever. TOLERATED CEPHALEXIN. Has patient had a PCN reaction causing immediate rash, facial/tongue/throat swelling, SOB or lightheadedness with  hypotension: No Has patient had a PCN reaction causing severe rash involving mucus membranes or skin necrosis: No Has patient had a PCN reaction that required hospitalization: No Has patient had a PCN reaction occurring within the last 10 years: No If all of the above answers are "NO", then may proceed with Cephalosporin use.   Pneumococcal Vaccines Swelling, Rash, Other (See Comments)   Low grade fever      Medication List       Accurate as of June 15, 2020 11:59 PM. If you have any questions, ask your nurse or doctor.        STOP taking these medications   HYDROcodone-acetaminophen 7.5-325 MG tablet Commonly known as: Norco Stopped  by: Hollice Espy, MD   nitroGLYCERIN 0.4 MG SL tablet Commonly known as: NITROSTAT Stopped by: Hollice Espy, MD   polyethylene glycol 17 g packet Commonly known as: MIRALAX / GLYCOLAX Stopped by: Hollice Espy, MD   sertraline 50 MG tablet Commonly known as: ZOLOFT Stopped by: Hollice Espy, MD   sildenafil 20 MG tablet Commonly known as: REVATIO Stopped by: Hollice Espy, MD   terbinafine 250 MG tablet Commonly known as: LAMISIL Stopped by: Hollice Espy, MD     TAKE these medications   aspirin EC 81 MG tablet Take 81 mg by mouth daily.   atorvastatin 10 MG tablet Commonly known as: LIPITOR Take 10 mg by mouth at bedtime.   B-D 3CC LUER-LOK SYR 22GX1" 22G X 1" 3 ML Misc Generic drug: SYRINGE-NEEDLE (DISP) 3 ML See admin instructions.   cholecalciferol 1000 units tablet Commonly known as: VITAMIN D Take 1,000 Units by mouth daily.   colchicine 0.6 MG tablet Take 0.6-1.2 mg by mouth See admin instructions. Take 2 tablets at onset of gout attack then 1 tablet daily as needed for pain   fluticasone 50 MCG/ACT nasal spray Commonly known as: FLONASE Place 1 spray into the nose daily as needed for rhinitis or allergies.   hydrocortisone cream 1 % Apply 1 application topically daily as needed for itching.   ibuprofen  200 MG tablet Commonly known as: ADVIL Take 400 mg by mouth every 6 (six) hours as needed for moderate pain.   lisinopril-hydrochlorothiazide 20-25 MG tablet Commonly known as: ZESTORETIC TAKE 1 TABLET BY MOUTH EVERY DAY   methocarbamol 500 MG tablet Commonly known as: Robaxin Take 1 tablet (500 mg total) by mouth every 6 (six) hours as needed for muscle spasms.   metoprolol succinate 50 MG 24 hr tablet Commonly known as: TOPROL-XL TAKE 1 TABLET (50 MG TOTAL) BY MOUTH DAILY.   pantoprazole 40 MG tablet Commonly known as: PROTONIX Take 40 mg by mouth daily as needed (heartburn).   tamsulosin 0.4 MG Caps capsule Commonly known as: FLOMAX Take 0.4 mg by mouth at bedtime.   testosterone cypionate 200 MG/ML injection Commonly known as: DEPOTESTOSTERONE CYPIONATE Inject 100 mg into the muscle once a week.   TUMS E-X PO Take 2 tablets by mouth daily as needed (heartburn).       Allergies:  Allergies  Allergen Reactions   Latex Itching    redness   Penicillins Itching and Rash    Fever. TOLERATED CEPHALEXIN. Has patient had a PCN reaction causing immediate rash, facial/tongue/throat swelling, SOB or lightheadedness with hypotension: No Has patient had a PCN reaction causing severe rash involving mucus membranes or skin necrosis: No Has patient had a PCN reaction that required hospitalization: No Has patient had a PCN reaction occurring within the last 10 years: No If all of the above answers are "NO", then may proceed with Cephalosporin use.    Pneumococcal Vaccines Swelling, Rash and Other (See Comments)    Low grade fever    Family History: Family History  Problem Relation Age of Onset   Heart disease Father    Hypertension Mother    Stroke Mother     Social History:  reports that he has never smoked. He has never used smokeless tobacco. He reports that he does not drink alcohol and does not use drugs.   Physical Exam: BP (!) 177/78    Pulse 72     Constitutional:  Alert and oriented, No acute distress.   Accompanied by wife today.  HEENT: [place] AT, moist mucus membranes.  Trachea midline, no masses. Cardiovascular: No clubbing, cyanosis, or edema. Respiratory: Normal respiratory effort, no increased work of breathing. GI: Abdomen is soft, nontender, nondistended, no abdominal masses GU: No CVA tenderness Lymph: No cervical or inguinal lymphadenopathy. Skin: No rashes, bruises or suspicious lesions. Neurologic: Grossly intact, no focal deficits, moving all 4 extremities. Psychiatric: Normal mood and affect.   Pertinent Imaging: Results for orders placed or performed in visit on 06/15/20  BLADDER SCAN AMB NON-IMAGING  Result Value Ref Range   Scan Result 53ml     Assessment & Plan:    1. BPH with outlet obstruction Enlarged prostate is estimated at 100 g, refractory urinary symptoms including recurrent episodes of urinary retention and multiple UTIs  IPSS is 22, severe.  Discussed the treatment HoLEP   We reviewed the surgery in detail today including the preoperative, intraoperative, and postoperative course.  This will most likely be an outpatient procedure pending the degree of post op hematuria.  He will go home with catheter for a few days post op and will either be taught how to remove his own catheter or return to the office for catheter removal. Risk of bleeding, infection, damage surrounding structures, injury to the bladder/ urethral, bladder neck contracture, ureteral stricture, retrograde ejaculation, stress/ urge incontinence, exacerbation of irritative voiding symptoms were all discussed in detail.    Patient agreed to HoLEP and I will need clearance from his cardiologist since he is on blood thinners. Should hold off on 81 mg aspirin.   Given his dizziness, would cut back to Flomax once daily unless he starts to have recurrent urinary symptoms in the interim  Recommend cystoscopy to rule out any other underlying  issues as well as evaluate anatomy prior to surgery, is agreeable this.  2. History of incomplete bladder emptying/urinary retention  PVR is 65 mL today.  3. rUTI Likely related to related to #1. UA today. Culture sent.   4. History of elevated PSA He has undergone extensive work up (see above).  No evidence of malignancy Most recent PSA was 11.0 on 06/10/2019 Most recent MRI reassuring   [facility] [address] [place], [place] [address] [phone]  I, Selena Batten, am acting as a scribe for Dr. Hollice Espy.  I have reviewed the above documentation for accuracy and completeness, and I agree with the above.   Hollice Espy, MD  I spent 60 total minutes on the day of the encounter including pre-visit review of the medical record, face-to-face time with the patient, and post visit ordering of labs/imaging/tests. Greater than 50 pages of charts are reviewed along with imaging an extensive period of time was spent counseling the patient and his wife.

## 2020-06-15 ENCOUNTER — Other Ambulatory Visit: Payer: Self-pay

## 2020-06-15 ENCOUNTER — Encounter: Payer: Self-pay | Admitting: Urology

## 2020-06-15 ENCOUNTER — Ambulatory Visit (INDEPENDENT_AMBULATORY_CARE_PROVIDER_SITE_OTHER): Payer: Medicare Other | Admitting: Urology

## 2020-06-15 ENCOUNTER — Other Ambulatory Visit: Payer: Self-pay | Admitting: Radiology

## 2020-06-15 VITALS — BP 177/78 | HR 72

## 2020-06-15 DIAGNOSIS — N138 Other obstructive and reflux uropathy: Secondary | ICD-10-CM

## 2020-06-15 DIAGNOSIS — N400 Enlarged prostate without lower urinary tract symptoms: Secondary | ICD-10-CM

## 2020-06-15 LAB — BLADDER SCAN AMB NON-IMAGING

## 2020-06-15 NOTE — Patient Instructions (Signed)

## 2020-06-16 ENCOUNTER — Telehealth: Payer: Self-pay | Admitting: *Deleted

## 2020-06-16 NOTE — Telephone Encounter (Signed)
   [facility] Pre-operative Risk Assessment    HEARTCARE STAFF: - Please ensure there is not already an duplicate clearance open for this procedure. - Under Visit Info/Reason for Call, type in Other and utilize the format Clearance MM/DD/YY or Clearance TBD. Do not use dashes or single digits. - If request is for dental extraction, please clarify the # of teeth to be extracted.  Request for surgical clearance:  1. What type of surgery is being performed?  HOLMIUM LASER ENUCLEATION OF THE PROSTATE   2. When is this surgery scheduled?  06/27/20   3. What type of clearance is required (medical clearance vs. Pharmacy clearance to hold med vs. Both)?  BOTH  4. Are there any medications that need to be held prior to surgery and how long? ASPIRIN X'S 7 DAYS   5. Practice name and name of physician performing surgery?  [facility] / DR. Erlene Quan   6. What is the office phone number?  [phone]   7.   What is the office fax number?  [phone]  8.   Anesthesia type (None, local, MAC, general) ?     Jeanann Lewandowsky 06/16/2020, 8:02 AM  _________________________________________________________________   (provider comments below)

## 2020-06-16 NOTE — Telephone Encounter (Signed)
   Primary Cardiologist: [name], MD  Chart reviewed as part of pre-operative protocol coverage. Patient was contacted 06/16/2020 in reference to pre-operative risk assessment for pending surgery as outlined below.  Dale [name] was last seen on 10/07/2019 by Dr. Tamala Julian.  Since that day, Dale [name] has done well from a cardiac standpoint. He walks 2-3 miles regularly and plays pickle ball. He can easily complete 4 METs without anginal complaints.   Therefore, based on ACC/AHA guidelines, the patient would be at acceptable risk for the planned procedure without further cardiovascular testing.   Patient can hold aspirin 5-7 days prior to surgery if needed and should restart when cleared to do so by his urologist.   I will route this recommendation to the requesting party via [facility] fax function and remove from pre-op pool. Please call with questions.  Abigail Butts, PA-C 06/16/2020, 8:43 AM

## 2020-06-17 LAB — URINALYSIS, COMPLETE
Bilirubin, UA: NEGATIVE
Glucose, UA: NEGATIVE
Ketones, UA: NEGATIVE
Leukocytes,UA: NEGATIVE
Nitrite, UA: NEGATIVE
Protein,UA: NEGATIVE
RBC, UA: NEGATIVE
Specific Gravity, UA: <1.005 — ABNORMAL LOW (ref 1.005–1.030)
Urobilinogen, Ur: 0.2 mg/dL (ref 0.2–1.0)
pH, UA: 5 (ref 5.0–7.5)

## 2020-06-17 LAB — MICROSCOPIC EXAMINATION
Bacteria, UA: NONE SEEN
Epithelial Cells (non renal): NONE SEEN /hpf (ref 0–10)

## 2020-06-19 LAB — CULTURE, URINE COMPREHENSIVE

## 2020-06-20 ENCOUNTER — Other Ambulatory Visit: Payer: Self-pay

## 2020-06-20 ENCOUNTER — Encounter
Admission: RE | Admit: 2020-06-20 | Discharge: 2020-06-20 | Disposition: A | Discharge status: Home / Self Care | Payer: Medicare Other | Source: Ambulatory Visit | Attending: Urology | Admitting: Urology

## 2020-06-20 HISTORY — DX: Other cervical disc degeneration, unspecified cervical region: M50.30

## 2020-06-20 HISTORY — DX: Anxiety disorder, unspecified: F41.9

## 2020-06-20 HISTORY — DX: Depression, unspecified: F32.A

## 2020-06-20 HISTORY — DX: Personal history of other medical treatment: Z92.89

## 2020-06-20 NOTE — Patient Instructions (Signed)
INSTRUCTIONS FOR SURGERY     Your surgery is scheduled for:   Monday, July 26TH     To find out your arrival time for the day of surgery,          please call [phone] between 1 pm and 3 pm on :  Friday, July 23RD     When you arrive for surgery, report to the [facility].       Do NOT stop on the first floor to register.    REMEMBER: Instructions that are not followed completely may result in serious medical risk,  up to and including death, or upon the discretion of your surgeon and anesthesiologist,            your surgery may need to be rescheduled.  __X__ 1. Do not eat food after midnight the night before your procedure.                    No gum, candy, lozenger, tic tacs, tums or hard candies.                  ABSOLUTELY NOTHING SOLID IN YOUR MOUTH AFTER MIDNIGHT                    You may drink unlimited clear liquids up to 2 hours before you are scheduled to arrive for surgery.                   Do not drink anything within those 2 hours unless you need to take medicine, then take the                   smallest amount you need.  Clear liquids include:  water, apple juice without pulp,                   any flavor Gatorade, Black coffee, black tea.  Sugar may be added but no dairy/ honey /lemon.                        Broth and jello is not considered a clear liquid.  __x__  2. On the morning of surgery, please brush your teeth with toothpaste and water. You may rinse with                  mouthwash if you wish but DO NOT SWALLOW TOOTHPASTE OR MOUTHWASH  __X___3. NO alcohol for 24 hours before or after surgery.  __x___ 4.  Do NOT smoke or use e-cigarettes for 24 HOURS PRIOR TO SURGERY.                      DO NOT Use any chewable tobacco products for at least 6 hours prior to surgery.  __x___ 5. If you start any new medication after this appointment and prior to surgery, please                    Bring it with you on the day of surgery.  ___x__ 6. Notify your doctor if there is any change in  your medical condition, such as fever,                  infection, vomitting, diarrhea or any open sores.  __x___ 7.  USE ANTIBACTERIAL  SOAP as instructed, the night before surgery OR the day of surgery.                   Once you have washed with this soap, do NOT use any of the following: Powders, perfumes                    or lotions. Please do not wear make up, hairpins, clips or nail polish. You may wear deodorant.                   Men may shave their face and neck.  Women need to shave 48 hours prior to surgery.                   DO NOT wear ANY jewelry on the day of surgery. If there are rings that are too tight to                    remove easily, please address this prior to the surgery day. Piercings need to be removed.                                                                     NO METAL ON YOUR BODY.                    Do NOT bring any valuables.  If you came to Pre-Admit testing then you will not need license,                     insurance card or credit card.  If you will be staying overnight, please either leave your things in                     the car or have your family be responsible for these items.                     [facility] IS NOT RESPONSIBLE FOR BELONGINGS OR VALUABLES.  ___X__ 8. DO NOT wear contact lenses on surgery day.  You may not have dentures,                     Hearing aides, contacts or glasses in the operating room. These items can be                    Placed in the Recovery Room to receive immediately after surgery.  __x___ 9. IF YOU ARE SCHEDULED TO GO HOME ON THE SAME DAY, YOU MUST                   Have someone to drive you home and to stay with you  for the first 24 hours.                    Have an arrangement prior to arriving on surgery day.  ___x__ 10. Take the following medications on the  morning of surgery with a sip of water:                               1.METOPROLOL                     2.PROTONIX(take an extra dose the night before surgery)                     3.FLONASE, if you need it                     4.                     _____ 11.  Follow any instructions provided to you by your surgeon.                        Such as enema, clear liquid bowel prep  __X__  12. STOP ALL ASPIRIN PRODUCTS AS OF ONE WEEK PRIOR TO SURGERY.                       THIS INCLUDES BC POWDERS / GOODIES POWDER  __x___ 13. STOP Anti-inflammatories as of ONE WEEK PRIOR TO SURGERY.                      This includes IBUPROFEN / MOTRIN / ADVIL / ALEVE/ NAPROXYN                    YOU MAY TAKE TYLENOL ANY TIME PRIOR TO SURGERY.  _X____ 14.  You may continue taking Vitamin D3 but do not take on the morning of surgery.  _____ 15. Bring your CPAP machine into preop with you on the morning of surgery.  __X____17.  Continue to take the following medications but do not take on the morning of surgery:                       ZESTORETIC // TUMS // ROBAXIN // VITAMIN D  __X____18. Wear clean and comfortable clothing to the hospital.  Please bring your medical power of attorney and living will. We will make a copy for your chart.   BRING PHONE NUMBERS FOR YOUR CONTACT PEOPLE.

## 2020-06-22 ENCOUNTER — Ambulatory Visit: Payer: Self-pay | Admitting: Urology

## 2020-06-22 NOTE — Progress Notes (Signed)
   06/23/2020  CC:  Chief Complaint  Patient presents with  . Cysto    HPI: Dale [name] is a 73 y.o. male with the history of incomplete bladder emptying/urniary rentention, elelvated PSA and rUTI returns today for a cysto.   The patient has been followed by Dr. Diona Fanti and is being managed medically with Flomax but continues to have refractory symptoms is concerned about long-term side effects of Flomax. He is currently on double dose. He does occasionally also get dizzy when he takes it with his lisinopril.  He has a personal history of incomplete bladder emptying and recurrent urinary rentention and acute cystitis/ UTI.  He has a personal history of hypogonadism for which he took testosterone supplements.  He has ED due to arterial insufficiency, 100 mg sildenafil is not very effective for him. His sildenafil was increased from 40-50 mg to 60 mg. If not successful could consider Cialis or  PGD injections.   He has a long history of elevated PSA. On 09/15/2010: TRUS/Bx PSA was 4.17, volume 37 mL. PSAD 0.11. All bx at that time revealed benign prostatic tissue with 1 small focus on chronic inflammation.  On 10/04/2017: Repeat TRUS/Bx. PSA was 7.92, prostatic volume 81.7 mL. PSAD 0.096 11/12 cores revealed benign prostatic tissue with 2 slowing inflammation. 1 core (right mid lateral) revealed a small focus of atypical.   Most recent PSA was 11.0 on 06/2019, which is slightly elevated from previous measurement at 8.63. The patient had been receiving testosterone injections.   Underwent prostate MRI on 10/19/2019 which revealed no high-grade carcinoma within the peripheral zone ( PI-RADS: 1) Enlarged nodular transitional zone consistent with benign prostate hypertrophy (PI-RADS:1). Prostate hypertrophy.   Blood pressure (!) 132/76, pulse 56. NED. A&Ox3.   No respiratory distress   Abd soft, NT, ND Normal phallus with bilateral descended testicles  Cystoscopy  Procedure Note  Patient identification was confirmed, informed consent was obtained, and patient was prepped using Betadine solution.  Lidocaine jelly was administered per urethral meatus.     Pre-Procedure: - Inspection reveals a normal caliber ureteral meatus.  Procedure: The flexible cystoscope was introduced without difficulty - No urethral strictures/lesions are present. - Enlarged prostate trilobar coaption - Elevated bladder neck - Bilateral ureteral orifices identified - Bladder mucosa  reveals no ulcers, tumors, or lesions - No bladder stones - Moderate trabeculation  Retroflexion shows intravesical protrusion with no median lobe   Post-Procedure: - Patient tolerated the procedure well  Assessment/ Plan:  1. History of incomplete bladder emptying/urinary rentention Plan for HoLEP as previous discussed, see office note  2. rUTI Like related to #1.  3. History of elevated PSA Patient has extensive work up (see above) No evidence of malignancy Recent PSA was 11.0 as of 06/10/2019     I, Selena Batten, am acting as a scribe for Dr. Hollice Espy.  I have reviewed the above documentation for accuracy and completeness, and I agree with the above.   Hollice Espy, MD

## 2020-06-23 ENCOUNTER — Other Ambulatory Visit: Payer: Self-pay

## 2020-06-23 ENCOUNTER — Encounter
Admission: RE | Admit: 2020-06-23 | Discharge: 2020-06-23 | Disposition: A | Discharge status: Home / Self Care | Payer: Medicare Other | Source: Ambulatory Visit | Attending: Urology | Admitting: Urology

## 2020-06-23 ENCOUNTER — Encounter: Payer: Self-pay | Admitting: Urology

## 2020-06-23 ENCOUNTER — Ambulatory Visit (INDEPENDENT_AMBULATORY_CARE_PROVIDER_SITE_OTHER): Payer: Medicare Other | Admitting: Urology

## 2020-06-23 VITALS — BP 132/76 | HR 56

## 2020-06-23 DIAGNOSIS — Z01818 Encounter for other preprocedural examination: Secondary | ICD-10-CM | POA: Insufficient documentation

## 2020-06-23 DIAGNOSIS — Z20822 Contact with and (suspected) exposure to covid-19: Secondary | ICD-10-CM | POA: Insufficient documentation

## 2020-06-23 DIAGNOSIS — N138 Other obstructive and reflux uropathy: Secondary | ICD-10-CM

## 2020-06-23 DIAGNOSIS — N401 Enlarged prostate with lower urinary tract symptoms: Secondary | ICD-10-CM | POA: Diagnosis not present

## 2020-06-23 DIAGNOSIS — I10 Essential (primary) hypertension: Secondary | ICD-10-CM | POA: Diagnosis not present

## 2020-06-23 LAB — BASIC METABOLIC PANEL
Anion gap: 7 (ref 5–15)
BUN: 33 mg/dL — ABNORMAL HIGH (ref 8–23)
CO2: 29 mmol/L (ref 22–32)
Calcium: 11.6 mg/dL — ABNORMAL HIGH (ref 8.9–10.3)
Chloride: 102 mmol/L (ref 98–111)
Creatinine, Ser: 1.57 mg/dL — ABNORMAL HIGH (ref 0.61–1.24)
GFR calc Af Amer: 50 mL/min — ABNORMAL LOW (ref >60)
GFR calc non Af Amer: 43 mL/min — ABNORMAL LOW (ref >60)
Glucose, Bld: 99 mg/dL (ref 70–99)
Potassium: 4.1 mmol/L (ref 3.5–5.1)
Sodium: 138 mmol/L (ref 135–145)

## 2020-06-23 LAB — CBC
HCT: 40.6 % (ref 39.0–52.0)
Hemoglobin: 14.6 g/dL (ref 13.0–17.0)
MCH: 33.2 pg (ref 26.0–34.0)
MCHC: 36 g/dL (ref 30.0–36.0)
MCV: 92.3 fL (ref 80.0–100.0)
Platelets: 191 10*3/uL (ref 150–400)
RBC: 4.4 MIL/uL (ref 4.22–5.81)
RDW: 14 % (ref 11.5–15.5)
WBC: 6.4 10*3/uL (ref 4.0–10.5)
nRBC: 0 % (ref 0.0–0.2)

## 2020-06-23 NOTE — Progress Notes (Signed)
[facility] Perioperative Services  Pre-Admission/Anesthesia Testing Clinical Review  Date: 06/23/20  Patient Demographics:  Name: Dale [name] DOB:   1946-03-20 MRN:   [id number]  Planned Surgical Procedure(s):    Case: [id number] Date/Time: 06/27/20 1238   Procedure: HOLEP-LASER ENUCLEATION OF THE PROSTATE WITH MORCELLATION (N/A )   Anesthesia type: General   Pre-op diagnosis: benign prostatic hypertrophy with bladder outlet obstruction   Location: [facility] OR ROOM 10 / [place] ORS FOR ANESTHESIA GROUP   Surgeons: Hollice Espy, MD     NOTE: Available PAT nursing documentation and vital signs have been reviewed. Clinical nursing staff has updated patient's PMH/PSHx, current medication list, and drug allergies/intolerances to ensure comprehensive history available to assist in medical decision making as it pertains to the aforementioned surgical procedure and anticipated anesthetic course.   Clinical Discussion:  Dale [name] is a 74 y.o. male who is submitted for pre-surgical anesthesia review and clearance prior to him undergoing the above procedure. Patient has never been a smoker. Pertinent PMH includes: exertional angina, systolic murmur, resting bradycardia, HTN, HLD, GERD (on daily PPI), cervical DDD, OA, BPH, depression, anxiety.  Patient is followed by cardiology Tamala Julian, MD). He was last seen in the cardiology clinic on 10/07/2019; notes reviewed. PMH (+) for angina pectoris and abnormal myocardial perfusion study in 2014. 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 At the time of his last visit, patient doing well overall. He was active with a documented functional capacity of > 4 METS. He denied chest pain, shortness of breath, orthopnea, PND, and recent use of his PRN nitroglycerin. Exam revealed no peripheral edema. Patient felt well overall. Patient compliant with prescribed statin, ACEi, diuretic,  beta blocker, and antiplatelet therapies. Patient scheduled to follow up with cardiology in 1 year. Cardiac clearance sought for patient's upcoming urology procedure. Per cardiology, "based on ACC/AHA guidelines, the patient would be at acceptable risk for the planned procedure without further cardiovascular testing". He has been instructed on recommendations for holding his daily low dose ASA for 5-7 days prior to his procedure. The patient has been instructed that his last dose of his antiplatelet will be on 06/20/2020.  He denies previous intra-operative complications with anesthesia. He underwent a neuraxial anesthetic course at [facility] ([facility]A III) in 04/2019 with no documented complications.   Vitals with BMI 06/23/2020 06/20/2020 06/15/2020  Height - 5\' 7"  -  Weight - 172 lbs -  BMI - 81.44 -  Systolic 818 - 563  Diastolic 76 - 78  Pulse 56 - 72    Providers/Specialists:   PROVIDER ROLE LAST Lu Duffel, MD Urology (Surgeon) 03/16/2020  [name], MD Primary Care Provider ?  [name], MD Cardiology 10/07/2019   Allergies:  Latex, Penicillins, and Pneumococcal vaccines  Current Home Medications:   No current facility-administered medications for this encounter.   Marland Kitchen aspirin EC 81 MG tablet  . atorvastatin (LIPITOR) 10 MG tablet  . Calcium Carbonate Antacid (TUMS E-X PO)  . cholecalciferol (VITAMIN D) 1000 units tablet  . colchicine 0.6 MG tablet  . fluticasone (FLONASE) 50 MCG/ACT nasal spray  . hydrocortisone cream 1 %  . ibuprofen (ADVIL) 200 MG tablet  . lisinopril-hydrochlorothiazide (ZESTORETIC) 20-25 MG tablet  . methocarbamol (ROBAXIN) 500 MG tablet  . metoprolol succinate (TOPROL-XL) 50 MG 24 hr tablet  . pantoprazole (PROTONIX) 40 MG tablet  . tamsulosin (FLOMAX) 0.4 MG CAPS capsule  . B-D 3CC LUER-LOK SYR 22GX1" 22G X  1" 3 ML MISC  . sertraline (ZOLOFT) 50 MG tablet  . testosterone cypionate (DEPOTESTOSTERONE CYPIONATE) 200 MG/ML  injection   History:   Past Medical History:  Diagnosis Date  . Allergic rhinitis   . Anxiety   . BPH (benign prostatic hyperplasia)   . DDD (degenerative disc disease), cervical   . Depression   . ED (erectile dysfunction)   . Exertional angina [facility])    cardiologist-  [name]---  04-21-2013 abnormal myoview with epicardial coronaries ;  per cardiac cath 05-01-2013 no sig. obstructive CAD (LAD calcification, diffuse plaqueing) ;  evidence dRCA and LV branch vasoconstriction  . GERD (gastroesophageal reflux disease)   . Gout 06/2020   recent episode a few weeks ago  . History of adenomatous polyp of colon   . History of basal cell carcinoma (BCC) excision   . History of blood transfusion   . History of squamous cell carcinoma excision    09/ 27/ 2019  right lower leg   . HTN (hypertension)   . Hypercholesteremia   . Hypogonadism in male   . OA (osteoarthritis)    knees, lumbar, fingers  . Seasonal allergies   . Skin cancer, basal cell   . Systolic murmur    Past Surgical History:  Procedure Laterality Date  . APPENDECTOMY  age 64  . CARDIAC CATHETERIZATION     no stent  . CATARACT EXTRACTION W/ INTRAOCULAR LENS  IMPLANT, BILATERAL  07/2018  . COLONOSCOPY N/A 03/02/2014   Procedure: COLONOSCOPY;  Surgeon: [name], MD;  Location: [facility] ENDOSCOPY;  Service: Endoscopy;  Laterality: N/A;  . EYE SURGERY Bilateral    cataract   . FINGER SURGERY     11-14-2004 right long finger;  12-19-2007 left index finger (both done by dr sypher)  . HOT HEMOSTASIS N/A 03/02/2014   Procedure: HOT HEMOSTASIS (ARGON PLASMA COAGULATION/BICAP);  Surgeon: [name], MD;  Location: [facility];  Service: Endoscopy;  Laterality: N/A;  . INGUINAL HERNIA REPAIR Bilateral left , early 2000s;  right 12/ 2003   inguinal hernia, with mesh  . JOINT REPLACEMENT    . KNEE ARTHROSCOPY Bilateral right- last one 1996;  left , last one ,yrs ago  . KNEE SURGERY Left 1972   open  . LEFT  HEART CATHETERIZATION WITH CORONARY ANGIOGRAM N/A 05/01/2013   Procedure: LEFT HEART CATHETERIZATION WITH CORONARY ANGIOGRAM;  Surgeon: [name], MD;  Location: [facility];  Service: Cardiovascular;  Laterality: N/A;  . MOHS SURGERY  yrs ago   nose  . RECONSTRUCTION OF NOSE  1968  . TOTAL KNEE ARTHROPLASTY Left 09/16/2018   Procedure: LEFT TOTAL KNEE ARTHROPLASTY;  Surgeon: [name], MD;  Location: [facility] ORS;  Service: Orthopedics;  Laterality: Left;  70 mins  . TOTAL KNEE ARTHROPLASTY Right 04/28/2019   Procedure: TOTAL KNEE ARTHROPLASTY;  Surgeon: [name], MD;  Location: [facility] ORS;  Service: Orthopedics;  Laterality: Right;  70 mins   Family History  Problem Relation Age of Onset  . Heart disease Father   . Hypertension Mother   . Stroke Mother    Social History   Tobacco Use  . Smoking status: Never Smoker  . Smokeless tobacco: Never Used  Vaping Use  . Vaping Use: Never used  Substance Use Topics  . Alcohol use: No  . Drug use: Never    Pertinent Clinical Results:  LABS: Labs reviewed: Acceptable for surgery.  Hospital Outpatient Visit on 06/23/2020  Component Date Value Ref  Range Status  . Sodium 06/23/2020 138  135 - 145 mmol/L Final  . Potassium 06/23/2020 4.1  3.5 - 5.1 mmol/L Final  . Chloride 06/23/2020 102  98 - 111 mmol/L Final  . CO2 06/23/2020 29  22 - 32 mmol/L Final  . Glucose, Bld 06/23/2020 99  70 - 99 mg/dL Final   Glucose reference range applies only to samples taken after fasting for at least 8 hours.  . BUN 06/23/2020 33* 8 - 23 mg/dL Final  . Creatinine, Ser 06/23/2020 1.57* 0.61 - 1.24 mg/dL Final  . Calcium 06/23/2020 11.6* 8.9 - 10.3 mg/dL Final  . GFR calc non Af Amer 06/23/2020 43* >60 mL/min Final  . GFR calc Af Amer 06/23/2020 50* >60 mL/min Final  . Anion gap 06/23/2020 7  5 - 15 Final   Performed at [facility], [address], [place], [place] [address]  . WBC 06/23/2020 6.4  4.0 - 10.5 K/uL Final  . RBC  06/23/2020 4.40  4.22 - 5.81 MIL/uL Final  . Hemoglobin 06/23/2020 14.6  13.0 - 17.0 g/dL Final  . HCT 06/23/2020 40.6  39 - 52 % Final  . MCV 06/23/2020 92.3  80.0 - 100.0 fL Final  . MCH 06/23/2020 33.2  26.0 - 34.0 pg Final  . MCHC 06/23/2020 36.0  30.0 - 36.0 g/dL Final  . RDW 06/23/2020 14.0  11.5 - 15.5 % Final  . Platelets 06/23/2020 191  150 - 400 K/uL Final  . nRBC 06/23/2020 0.0  0.0 - 0.2 % Final   Performed at [facility], [address] [place],  [address]    ECG: Date: 06/23/2020 Time ECG obtained: 1203 PM Rate: 44 bpm Rhythm: sinus bradycardia Axis (leads I and aVF): Normal Intervals: PR 200 ms. QTc 364 ms. ST segment and T wave changes: No evidence of acute ST segment elevation or depression Comparison: Similar to previous tracing obtained on 10/07/2019. Notes: EKG reviewed with cardiologist Tamala Julian, MD). 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 HR related to beta blocker therapy; asymptomatic (cardiology to follow up). Per Dr. Tamala Julian, "no significant changes from last year". Appreciate cardiology input.   IMAGING / PROCEDURES: -Cardiology testing not available for review in [facility] at time of clinical review.   Impression and Plan:  Rourke Mcquitty Pietila has been referred for pre-anesthesia review and clearance prior him undergoing the planned anesthetic and procedural courses. Available labs, pertinent testing, and imaging results were personally reviewed by me. This patient has been appropriately cleared by cardiology.   Based on clinical review performed today (06/23/20), barring and significant acute changes in the patient's overall condition, it is anticipated that he will be able to proceed with the planned surgical intervention. Any acute changes in clinical condition may necessitate his procedure being postponed and/or cancelled. Pre-surgical instructions were reviewed with the patient during his PAT  appointment and questions were fielded by PAT clinical staff.  Honor Loh, MSN, APRN, FNP-C, CEN [facility]  Peri-operative Services Nurse Practitioner Phone: [phone] 06/23/20 2:12 PM  NOTE: This note has been prepared using Dragon dictation software. Despite my best ability to proofread, there is always the potential that unintentional transcriptional errors may still occur from this process.

## 2020-06-24 LAB — URINALYSIS, COMPLETE
Bilirubin, UA: NEGATIVE
Glucose, UA: NEGATIVE
Ketones, UA: NEGATIVE
Leukocytes,UA: NEGATIVE
Nitrite, UA: NEGATIVE
Protein,UA: NEGATIVE
RBC, UA: NEGATIVE
Specific Gravity, UA: 1.015 (ref 1.005–1.030)
Urobilinogen, Ur: 0.2 mg/dL (ref 0.2–1.0)
pH, UA: 5 (ref 5.0–7.5)

## 2020-06-24 LAB — MICROSCOPIC EXAMINATION
Bacteria, UA: NONE SEEN
Epithelial Cells (non renal): NONE SEEN /hpf (ref 0–10)
RBC, Urine: NONE SEEN /hpf (ref 0–2)
WBC, UA: NONE SEEN /hpf (ref 0–5)

## 2020-06-24 LAB — SARS CORONAVIRUS 2 (TAT 6-24 HRS): SARS Coronavirus 2: NEGATIVE

## 2020-06-27 ENCOUNTER — Ambulatory Visit
Admission: RE | Admit: 2020-06-27 | Discharge: 2020-06-27 | Disposition: A | Discharge status: Home / Self Care | Payer: Medicare Other | Attending: Urology | Admitting: Urology

## 2020-06-27 ENCOUNTER — Encounter
Admission: RE | Disposition: A | Discharge status: Home / Self Care | Payer: Self-pay | Source: Home / Self Care | Attending: Urology

## 2020-06-27 ENCOUNTER — Ambulatory Visit: Payer: Medicare Other | Admitting: Urgent Care

## 2020-06-27 ENCOUNTER — Other Ambulatory Visit: Payer: Self-pay

## 2020-06-27 ENCOUNTER — Encounter: Payer: Self-pay | Admitting: Urology

## 2020-06-27 ENCOUNTER — Other Ambulatory Visit: Payer: Self-pay | Admitting: Radiology

## 2020-06-27 DIAGNOSIS — Z91040 Latex allergy status: Secondary | ICD-10-CM | POA: Diagnosis not present

## 2020-06-27 DIAGNOSIS — Z87440 Personal history of urinary (tract) infections: Secondary | ICD-10-CM | POA: Insufficient documentation

## 2020-06-27 DIAGNOSIS — N401 Enlarged prostate with lower urinary tract symptoms: Secondary | ICD-10-CM | POA: Insufficient documentation

## 2020-06-27 DIAGNOSIS — I208 Other forms of angina pectoris: Secondary | ICD-10-CM | POA: Insufficient documentation

## 2020-06-27 DIAGNOSIS — R3914 Feeling of incomplete bladder emptying: Secondary | ICD-10-CM | POA: Diagnosis not present

## 2020-06-27 DIAGNOSIS — N35919 Unspecified urethral stricture, male, unspecified site: Secondary | ICD-10-CM | POA: Diagnosis not present

## 2020-06-27 DIAGNOSIS — K219 Gastro-esophageal reflux disease without esophagitis: Secondary | ICD-10-CM | POA: Insufficient documentation

## 2020-06-27 DIAGNOSIS — I10 Essential (primary) hypertension: Secondary | ICD-10-CM | POA: Insufficient documentation

## 2020-06-27 DIAGNOSIS — F419 Anxiety disorder, unspecified: Secondary | ICD-10-CM | POA: Insufficient documentation

## 2020-06-27 DIAGNOSIS — Z96653 Presence of artificial knee joint, bilateral: Secondary | ICD-10-CM | POA: Diagnosis not present

## 2020-06-27 DIAGNOSIS — Z86010 Personal history of colonic polyps: Secondary | ICD-10-CM | POA: Diagnosis not present

## 2020-06-27 DIAGNOSIS — N35912 Unspecified bulbous urethral stricture, male: Secondary | ICD-10-CM

## 2020-06-27 DIAGNOSIS — Z85828 Personal history of other malignant neoplasm of skin: Secondary | ICD-10-CM | POA: Insufficient documentation

## 2020-06-27 DIAGNOSIS — M1990 Unspecified osteoarthritis, unspecified site: Secondary | ICD-10-CM | POA: Diagnosis not present

## 2020-06-27 DIAGNOSIS — Z79899 Other long term (current) drug therapy: Secondary | ICD-10-CM | POA: Insufficient documentation

## 2020-06-27 DIAGNOSIS — Z887 Allergy status to serum and vaccine status: Secondary | ICD-10-CM | POA: Insufficient documentation

## 2020-06-27 DIAGNOSIS — Z8249 Family history of ischemic heart disease and other diseases of the circulatory system: Secondary | ICD-10-CM | POA: Insufficient documentation

## 2020-06-27 DIAGNOSIS — Z7982 Long term (current) use of aspirin: Secondary | ICD-10-CM | POA: Insufficient documentation

## 2020-06-27 DIAGNOSIS — F329 Major depressive disorder, single episode, unspecified: Secondary | ICD-10-CM | POA: Insufficient documentation

## 2020-06-27 DIAGNOSIS — Z9841 Cataract extraction status, right eye: Secondary | ICD-10-CM | POA: Insufficient documentation

## 2020-06-27 DIAGNOSIS — R338 Other retention of urine: Secondary | ICD-10-CM | POA: Diagnosis not present

## 2020-06-27 DIAGNOSIS — Z880 Allergy status to penicillin: Secondary | ICD-10-CM | POA: Insufficient documentation

## 2020-06-27 DIAGNOSIS — Z9842 Cataract extraction status, left eye: Secondary | ICD-10-CM | POA: Insufficient documentation

## 2020-06-27 DIAGNOSIS — N138 Other obstructive and reflux uropathy: Secondary | ICD-10-CM

## 2020-06-27 DIAGNOSIS — E7800 Pure hypercholesterolemia, unspecified: Secondary | ICD-10-CM | POA: Insufficient documentation

## 2020-06-27 DIAGNOSIS — Z791 Long term (current) use of non-steroidal anti-inflammatories (NSAID): Secondary | ICD-10-CM | POA: Diagnosis not present

## 2020-06-27 DIAGNOSIS — Z961 Presence of intraocular lens: Secondary | ICD-10-CM | POA: Diagnosis not present

## 2020-06-27 DIAGNOSIS — Z823 Family history of stroke: Secondary | ICD-10-CM | POA: Insufficient documentation

## 2020-06-27 DIAGNOSIS — R011 Cardiac murmur, unspecified: Secondary | ICD-10-CM | POA: Insufficient documentation

## 2020-06-27 DIAGNOSIS — M109 Gout, unspecified: Secondary | ICD-10-CM | POA: Insufficient documentation

## 2020-06-27 DIAGNOSIS — N320 Bladder-neck obstruction: Secondary | ICD-10-CM | POA: Insufficient documentation

## 2020-06-27 HISTORY — PX: CYSTOSCOPY WITH URETHRAL DILATATION: SHX5125

## 2020-06-27 SURGERY — CYSTOSCOPY, WITH URETHRAL DILATION
Anesthesia: General | Site: Urethra

## 2020-06-27 MED ORDER — OXYCODONE HCL 5 MG PO TABS
ORAL_TABLET | ORAL | Status: AC
  Filled 2020-06-27: qty 1

## 2020-06-27 MED ORDER — LIDOCAINE HCL (PF) 2 % IJ SOLN
INTRAMUSCULAR | Status: AC
  Filled 2020-06-27: qty 5

## 2020-06-27 MED ORDER — EPHEDRINE 5 MG/ML INJ
INTRAVENOUS | Status: AC
  Filled 2020-06-27: qty 10

## 2020-06-27 MED ORDER — FENTANYL CITRATE (PF) 100 MCG/2ML IJ SOLN: 25.0000 ug | INTRAMUSCULAR | Status: DC | PRN

## 2020-06-27 MED ORDER — HYDROCODONE-ACETAMINOPHEN 5-325 MG PO TABS: 1.0000 | ORAL_TABLET | Freq: Four times a day (QID) | ORAL | 0 refills | Status: DC | PRN

## 2020-06-27 MED ORDER — ACETAMINOPHEN 10 MG/ML IV SOLN
INTRAVENOUS | Status: AC
  Filled 2020-06-27: qty 100

## 2020-06-27 MED ORDER — LIDOCAINE HCL URETHRAL/MUCOSAL 2 % EX GEL
CUTANEOUS | Status: AC
  Filled 2020-06-27: qty 10

## 2020-06-27 MED ORDER — SEVOFLURANE IN SOLN
RESPIRATORY_TRACT | Status: AC
  Filled 2020-06-27: qty 250

## 2020-06-27 MED ORDER — PROPOFOL 10 MG/ML IV BOLUS
INTRAVENOUS | Status: DC | PRN
  Administered 2020-06-27: 150 mg via INTRAVENOUS

## 2020-06-27 MED ORDER — CIPROFLOXACIN IN D5W 400 MG/200ML IV SOLN
400.0000 mg | INTRAVENOUS | Status: AC
  Administered 2020-06-27: 400 mg via INTRAVENOUS

## 2020-06-27 MED ORDER — DEXAMETHASONE SODIUM PHOSPHATE 10 MG/ML IJ SOLN
INTRAMUSCULAR | Status: DC | PRN
  Administered 2020-06-27: 10 mg via INTRAVENOUS

## 2020-06-27 MED ORDER — ROCURONIUM BROMIDE 10 MG/ML (PF) SYRINGE
PREFILLED_SYRINGE | INTRAVENOUS | Status: AC
  Filled 2020-06-27: qty 10

## 2020-06-27 MED ORDER — CHLORHEXIDINE GLUCONATE 0.12 % MT SOLN: 15.0000 mL | Freq: Once | OROMUCOSAL | Status: AC

## 2020-06-27 MED ORDER — LIDOCAINE HCL URETHRAL/MUCOSAL 2 % EX GEL
CUTANEOUS | Status: DC | PRN
  Administered 2020-06-27: 1 via URETHRAL

## 2020-06-27 MED ORDER — MIDAZOLAM HCL 2 MG/2ML IJ SOLN
INTRAMUSCULAR | Status: AC
  Filled 2020-06-27: qty 2

## 2020-06-27 MED ORDER — MIDAZOLAM HCL 2 MG/2ML IJ SOLN
INTRAMUSCULAR | Status: DC | PRN
  Administered 2020-06-27: 2 mg via INTRAVENOUS

## 2020-06-27 MED ORDER — ORAL CARE MOUTH RINSE: 15.0000 mL | Freq: Once | OROMUCOSAL | Status: AC

## 2020-06-27 MED ORDER — FENTANYL CITRATE (PF) 100 MCG/2ML IJ SOLN
INTRAMUSCULAR | Status: DC | PRN
  Administered 2020-06-27: 50 ug via INTRAVENOUS

## 2020-06-27 MED ORDER — LACTATED RINGERS IV SOLN: INTRAVENOUS | Status: DC

## 2020-06-27 MED ORDER — OXYBUTYNIN CHLORIDE 5 MG PO TABS
5.0000 mg | ORAL_TABLET | Freq: Three times a day (TID) | ORAL | 0 refills | Status: DC | PRN
Start: 2020-06-27 — End: 2020-07-04

## 2020-06-27 MED ORDER — SULFAMETHOXAZOLE-TRIMETHOPRIM 800-160 MG PO TABS
1.0000 | ORAL_TABLET | Freq: Two times a day (BID) | ORAL | 0 refills | Status: DC
Start: 2020-06-27 — End: 2020-06-28

## 2020-06-27 MED ORDER — EPHEDRINE SULFATE 50 MG/ML IJ SOLN
INTRAMUSCULAR | Status: DC | PRN
  Administered 2020-06-27: 15 mg via INTRAVENOUS

## 2020-06-27 MED ORDER — ONDANSETRON HCL 4 MG/2ML IJ SOLN
INTRAMUSCULAR | Status: AC
  Filled 2020-06-27: qty 2

## 2020-06-27 MED ORDER — PHENAZOPYRIDINE HCL 200 MG PO TABS
200.0000 mg | ORAL_TABLET | Freq: Three times a day (TID) | ORAL | 0 refills | Status: DC | PRN
Start: 2020-06-27 — End: 2020-07-04

## 2020-06-27 MED ORDER — CHLORHEXIDINE GLUCONATE 0.12 % MT SOLN
OROMUCOSAL | Status: AC
  Administered 2020-06-27: 15 mL via OROMUCOSAL
  Filled 2020-06-27: qty 15

## 2020-06-27 MED ORDER — LIDOCAINE HCL (CARDIAC) PF 100 MG/5ML IV SOSY
PREFILLED_SYRINGE | INTRAVENOUS | Status: DC | PRN
  Administered 2020-06-27: 100 mg via INTRAVENOUS

## 2020-06-27 MED ORDER — OXYCODONE HCL 5 MG PO TABS
5.0000 mg | ORAL_TABLET | Freq: Once | ORAL | Status: AC | PRN
  Administered 2020-06-27: 5 mg via ORAL

## 2020-06-27 MED ORDER — OXYCODONE HCL 5 MG/5ML PO SOLN: 5.0000 mg | Freq: Once | ORAL | Status: AC | PRN

## 2020-06-27 MED ORDER — DEXAMETHASONE SODIUM PHOSPHATE 10 MG/ML IJ SOLN
INTRAMUSCULAR | Status: AC
  Filled 2020-06-27: qty 1

## 2020-06-27 MED ORDER — ROCURONIUM BROMIDE 100 MG/10ML IV SOLN
INTRAVENOUS | Status: DC | PRN
  Administered 2020-06-27: 50 mg via INTRAVENOUS

## 2020-06-27 MED ORDER — ONDANSETRON HCL 4 MG/2ML IJ SOLN
INTRAMUSCULAR | Status: DC | PRN
  Administered 2020-06-27: 4 mg via INTRAVENOUS

## 2020-06-27 MED ORDER — PROPOFOL 10 MG/ML IV BOLUS
INTRAVENOUS | Status: AC
  Filled 2020-06-27: qty 20

## 2020-06-27 MED ORDER — CIPROFLOXACIN IN D5W 400 MG/200ML IV SOLN
INTRAVENOUS | Status: AC
  Filled 2020-06-27: qty 200

## 2020-06-27 MED ORDER — SUGAMMADEX SODIUM 200 MG/2ML IV SOLN
INTRAVENOUS | Status: DC | PRN
  Administered 2020-06-27: 312 mg via INTRAVENOUS

## 2020-06-27 MED ORDER — FENTANYL CITRATE (PF) 100 MCG/2ML IJ SOLN
INTRAMUSCULAR | Status: AC
  Filled 2020-06-27: qty 2

## 2020-06-27 SURGICAL SUPPLY — 38 items
ADAPTER IRRIG TUBE 2 SPIKE SOL (ADAPTER) ×8 IMPLANT
ADPR TBG 2 SPK PMP STRL ASCP (ADAPTER) ×4
BAG DRN LRG CPC RND TRDRP CNTR (MISCELLANEOUS) ×2
BAG DRN RND TRDRP ANRFLXCHMBR (UROLOGICAL SUPPLIES) ×2
BAG URINE DRAIN 2000ML AR STRL (UROLOGICAL SUPPLIES) ×3 IMPLANT
BAG URO DRAIN 4000ML (MISCELLANEOUS) ×3 IMPLANT
CATH FOL 2WAY LX 20X30 (CATHETERS) IMPLANT
CATH FOL 2WAY LX 22X30 (CATHETERS) IMPLANT
CATH FOL 2WAY SIL 24X5 (CATHETERS) ×3 IMPLANT
CATH FOLEY 3WAY 30CC 22FR (CATHETERS) IMPLANT
CATH URETHRAL DIL 7.0X29 (CATHETERS) ×3 IMPLANT
CATH URETL 5X70 OPEN END (CATHETERS) ×4 IMPLANT
CONTAINER COLLECT MORCELLATR (MISCELLANEOUS) ×1 IMPLANT
DRAPE 3/4 80X56 (DRAPES) ×4 IMPLANT
DRAPE UTILITY 15X26 TOWEL STRL (DRAPES) IMPLANT
FILTER OVERFLOW MORCELLATOR (FILTER) ×1 IMPLANT
GLOVE BIO SURGEON STRL SZ 6.5 (GLOVE) ×6 IMPLANT
GLOVE BIO SURGEONS STRL SZ 6.5 (GLOVE) ×2
GOWN STRL REUS W/ TWL LRG LVL3 (GOWN DISPOSABLE) ×4 IMPLANT
GOWN STRL REUS W/TWL LRG LVL3 (GOWN DISPOSABLE) ×8
GUIDEWIRE STR DUAL SENSOR (WIRE) ×3 IMPLANT
HOLDER FOLEY CATH W/STRAP (MISCELLANEOUS) ×4 IMPLANT
KIT TURNOVER CYSTO (KITS) ×4 IMPLANT
MBRN O SEALING YLW 17 FOR INST (MISCELLANEOUS)
MEMBRANE SLNG YLW 17 FOR INST (MISCELLANEOUS) ×1 IMPLANT
MORCELLATOR COLLECT CONTAINER (MISCELLANEOUS)
MORCELLATOR OVERFLOW FILTER (FILTER)
MORCELLATOR ROTATION 4.75 335 (MISCELLANEOUS) ×1 IMPLANT
PACK CYSTO AR (MISCELLANEOUS) ×4 IMPLANT
SET CYSTO W/LG BORE CLAMP LF (SET/KITS/TRAYS/PACK) IMPLANT
SET IRRIG Y TYPE TUR BLADDER L (SET/KITS/TRAYS/PACK) ×4 IMPLANT
SLEEVE PROTECTION STRL DISP (MISCELLANEOUS) ×8 IMPLANT
SOL .9 NS 3000ML IRR  AL (IV SOLUTION) ×16
SOL .9 NS 3000ML IRR AL (IV SOLUTION) ×8
SOL .9 NS 3000ML IRR UROMATIC (IV SOLUTION) ×8 IMPLANT
SYRINGE IRR TOOMEY STRL 70CC (SYRINGE) ×4 IMPLANT
TUBE PUMP MORCELLATOR PIRANHA (TUBING) ×1 IMPLANT
WATER STERILE IRR 1000ML POUR (IV SOLUTION) ×4 IMPLANT

## 2020-06-27 NOTE — H&P (View-Only) (Signed)
Virtual Visit via Video Note  I connected with Dale [name] on 06/28/2020 at 11:30 AM EDT by a video enabled telemedicine application and verified that I am speaking with the correct person using two identifiers.  Location: Patient: Home Provider: Office   I discussed the limitations of evaluation and management by telemedicine and the availability of in person appointments. The patient expressed understanding and agreed to proceed.  History of Present Illness: Dale [name] is a 74 y.o.  male with the history of incomplete bladder emptying/urniary rentention, elelvated PSA and rUTI who presents today for discussion regarding direction of therapy.   Patient underwent HoLEP on 06/27/2020. Unable to advance necessary 26 Fr scope in order to accomplish HoLEP. Decision made to place council tip type catheter over wire, 24 French to allow urethra to heal and passive dilation (see operative note).   Today he is requesting to have his catheter downsized or removed for comfort.    Observations/Objective: Patient is engaged and asking good questions. His wife has accompanied him during the visit.   Assessment and Plan:  1. History of incomplete bladder emptying/urinary rentention -Patient underwent attempted HoLEP on 06/27/2020. Unable to advance 69 French resectoscope in order to accommodate morcellator. Attempted to dilate urethra using male sounds and ultimately a Lacinda Axon 24 [place] balloon yet still unable to successfully advance resectoscope.     -Decision made to place council tip type catheter over wire, 24 French to allow urethra to heal and passive dilation.  -Discussed returning to the OR on 07/04/2020 for a second attempt a HoLEP or consider Urolift as an alternative (smaller scope requirement)  -Reviewed balance of risks vs. Benefits and concerns about causing urethral trauma and risk of stricture dieaseas  -Patient is more concerned about the risk of procedures and long  term effects. Many questions were asked and answered.   -After extensive conversation and discussing various options patient agreed to a Urolift and starting antibiotic on 06/30/2020.    -Patient will have his catheter downsized on 06/30/2020.   2. rUTI Likely related to #1.  3. History of elevated PSA Patient has extensive work up (see above) No evidence of malignancy Recent PSA was 11.0 as of 06/10/2019  Follow Up Instructions:    I discussed the assessment and treatment plan with the patient. The patient was provided an opportunity to ask questions and all were answered. The patient agreed with the plan and demonstrated an understanding of the instructions.   The patient was advised to call back or seek an in-person evaluation if the symptoms worsen or if the condition fails to improve as anticipated.  I provided 40 minutes of non-face-to-face time during this encounter.   Fransico Him, am acting as a scribe for Dr. Hollice Espy.  I have reviewed the above documentation for accuracy and completeness, and I agree with the above.   Hollice Espy, MD

## 2020-06-27 NOTE — Anesthesia Procedure Notes (Signed)
Procedure Name: Intubation Date/Time: 06/27/2020 11:19 AM Performed by: Doreen Salvage, CRNA Pre-anesthesia Checklist: Patient identified, Patient being monitored, Timeout performed, Emergency Drugs available and Suction available Patient Re-evaluated:Patient Re-evaluated prior to induction Oxygen Delivery Method: Circle system utilized Preoxygenation: Pre-oxygenation with 100% oxygen Induction Type: IV induction Ventilation: Mask ventilation without difficulty Laryngoscope Size: Mac, 3, McGraph and 4 Grade View: Grade I Tube type: Oral Tube size: 7.5 mm Number of attempts: 1 Airway Equipment and Method: Stylet Placement Confirmation: ETT inserted through vocal cords under direct vision,  positive ETCO2 and breath sounds checked- equal and bilateral Secured at: 21 cm Tube secured with: Tape Dental Injury: Teeth and Oropharynx as per pre-operative assessment

## 2020-06-27 NOTE — Transfer of Care (Signed)
Immediate Anesthesia Transfer of Care Note  Patient: Calvert Charland Fors  Procedure(s) Performed: Procedure(s): CYSTOSCOPY WITH URETHRAL DILATATION (N/A)  Patient Location: PACU  Anesthesia Type:General  Level of Consciousness: sedated  Airway & Oxygen Therapy: Patient Spontanous Breathing and Patient connected to face mask oxygen  Post-op Assessment: Report given to RN and Post -op Vital signs reviewed and stable  Post vital signs: Reviewed and stable  Last Vitals:  Vitals:   06/27/20 1035 06/27/20 1223  BP: (!) 153/80 (!) 136/63  Pulse: 66 57  Resp: 18 14  Temp: 36.4 C (!) 36.1 C  SpO2: 323% 55%    Complications: No apparent anesthesia complications

## 2020-06-27 NOTE — Op Note (Signed)
Date of procedure: 06/27/20  Preoperative diagnosis:  1. BPH with outlet obstruction  Postoperative diagnosis:  1. Same as above 2. Urethral stricture  Procedure: 1. Cystoscopy 2. Urethral dilation  Surgeon: Hollice Espy, MD  Anesthesia: General  Complications: None  Intraoperative findings: Unable to advance 61 French resectoscope in order to accommodate morcellator.  Attempted to dilate urethra using male sounds and ultimately a Lacinda Axon 24 [place] balloon yet still unable to successfully advance resectoscope.  As such, decision made to place council tip type catheter over wire, 24 French to allow urethra to heal and passive dilation.  EBL: Minimal  Specimens: None  Drains: 24 French nonlatex Foley catheter with 10 cc balloon (converted to council tip)  Indication: Dale [name] is a 74 y.o. patient with BPH, 100 g prostate who has elected to undergo holmium laser enucleation of the prostate.  After reviewing the management options for treatment, he elected to proceed with the above surgical procedure(s). We have discussed the potential benefits and risks of the procedure, side effects of the proposed treatment, the likelihood of the patient achieving the goals of the procedure, and any potential problems that might occur during the procedure or recuperation. Informed consent has been obtained.  Description of procedure:  The patient was taken to the operating room and general anesthesia was induced.  The patient was placed in the dorsal lithotomy position, prepped and draped in the usual sterile fashion, and preoperative antibiotics were administered. A preoperative time-out was performed.   This point time, a 40 French resectoscope was attempted to be placed however there was meatal stenosis.  No sounds were used within the fossa navicularis to serially dilate this portion of the urethra up to 28 [place].  I was ultimately able to advance the 26 French resectoscope to the  bulbar urethra using a visual obturator but not beyond this.  There is no overt obvious strictures just that the urethra itself was diffusely narrow.  I tried injecting an entire vial of lighted jet to help lubricate the area.  It did cause some mild trauma the bulbar urethra in my attempts to advance the scope.  I then attempted to advance a 24 [place] resectoscope using the same visual obturator was successful in doing so.  I thought perhaps by advancing this scope I would be able to advance a 26 [place] thereafter after dilation however was still not able to do so.  A sensor wire was placed under direct visualization presumably to the level of the bladder through the bulbar urethra.  I then attempted to dilate the bulbar urethra using a 24 French [place] urethral balloon.  I inflated the balloon with 10 cc of saline traversing the area of the bulbar urethra where allowed to sit for several minutes.  Once the balloon was deflated, I attempted to place the scope one last time over the previously placed sensor wire.  It some buckling in the bulbar urethra as I tried to advance the scope to the last time.  At this point time, I made the decision to abort the case.  I was concerned that by continuing to manipulate the bulbar urethra, because worsening bulbar urethral damage and possibly even a stricture disease.  As such, I converted a 24 [place] nonlatex catheter into a council tip placing over the wire into the bladder.  There was return of light pink urine which ultimately cleared.  The balloon was inflated with 10 cc of sterile water and the wire was removed.  The catheter was irrigated several times to ensure that there were no clots and confirm its patency.  Patient was then cleaned and dried, repositioned in supine position, reversed myesthesia, taken the PACU in stable condition.  Plan: I discussed the intraoperative findings with the patient's wife extensively.  Our conversation was recorded so that [name] himself could also hear the intraoperative course.  I will plan to see him via virtual visit tomorrow to discuss this further with him including various options which may include return in a second attempt next Monday after passive dilation versus proceeding with some sort of alternative procedures such as UroLift.  Hollice Espy, M.D.  Marland Kitchen

## 2020-06-27 NOTE — Interval H&P Note (Signed)
History and Physical Interval Note:  06/27/2020 10:40 AM  Dale [name]  has presented today for surgery, with the diagnosis of benign prostatic hypertrophy with bladder outlet obstruction.  The various methods of treatment have been discussed with the patient and family. After consideration of risks, benefits and other options for treatment, the patient has consented to  Procedure(s): [place] WITH MORCELLATION (N/A) as a surgical intervention.  The patient's history has been reviewed, patient examined, no change in status, stable for surgery.  I have reviewed the patient's chart and labs.  Questions were answered to the patient's satisfaction.    RRR CTAB   Hollice Espy

## 2020-06-27 NOTE — Progress Notes (Signed)
Virtual Visit via Video Note  I connected with Dale [name] on 06/28/2020 at 11:30 AM EDT by a video enabled telemedicine application and verified that I am speaking with the correct person using two identifiers.  Location: Patient: Home Provider: Office   I discussed the limitations of evaluation and management by telemedicine and the availability of in person appointments. The patient expressed understanding and agreed to proceed.  History of Present Illness: Dale [name] is a 74 y.o.  male with the history of incomplete bladder emptying/urniary rentention, elelvated PSA and rUTI who presents today for discussion regarding direction of therapy.   Patient underwent HoLEP on 06/27/2020. Unable to advance necessary 26 Fr scope in order to accomplish HoLEP. Decision made to place council tip type catheter over wire, 24 French to allow urethra to heal and passive dilation (see operative note).   Today he is requesting to have his catheter downsized or removed for comfort.    Observations/Objective: Patient is engaged and asking good questions. His wife has accompanied him during the visit.   Assessment and Plan:  1. History of incomplete bladder emptying/urinary rentention -Patient underwent attempted HoLEP on 06/27/2020. Unable to advance 49 French resectoscope in order to accommodate morcellator. Attempted to dilate urethra using male sounds and ultimately a Lacinda Axon 24 [place] balloon yet still unable to successfully advance resectoscope.     -Decision made to place council tip type catheter over wire, 24 French to allow urethra to heal and passive dilation.  -Discussed returning to the OR on 07/04/2020 for a second attempt a HoLEP or consider Urolift as an alternative (smaller scope requirement)  -Reviewed balance of risks vs. Benefits and concerns about causing urethral trauma and risk of stricture dieaseas  -Patient is more concerned about the risk of procedures and long  term effects. Many questions were asked and answered.   -After extensive conversation and discussing various options patient agreed to a Urolift and starting antibiotic on 06/30/2020.    -Patient will have his catheter downsized on 06/30/2020.   2. rUTI Likely related to #1.  3. History of elevated PSA Patient has extensive work up (see above) No evidence of malignancy Recent PSA was 11.0 as of 06/10/2019  Follow Up Instructions:    I discussed the assessment and treatment plan with the patient. The patient was provided an opportunity to ask questions and all were answered. The patient agreed with the plan and demonstrated an understanding of the instructions.   The patient was advised to call back or seek an in-person evaluation if the symptoms worsen or if the condition fails to improve as anticipated.  I provided 40 minutes of non-face-to-face time during this encounter.   Fransico Him, am acting as a scribe for Dr. Hollice Espy.  I have reviewed the above documentation for accuracy and completeness, and I agree with the above.   Hollice Espy, MD

## 2020-06-27 NOTE — Discharge Instructions (Addendum)
Indwelling Urinary Catheter Care, Adult An indwelling urinary catheter is a thin tube that is put into your bladder. The tube helps to drain pee (urine) out of your body. The tube goes in through your urethra. Your urethra is where pee comes out of your body. Your pee will come out through the catheter, then it will go into a bag (drainage bag). Take good care of your catheter so it will work well. How to wear your catheter and bag Supplies needed  Sticky tape (adhesive tape) or a leg strap.  Alcohol wipe or soap and water (if you use tape).  A clean towel (if you use tape).  Large overnight bag.  Smaller bag (leg bag). Wearing your catheter Attach your catheter to your leg with tape or a leg strap.  Make sure the catheter is not pulled tight.  If a leg strap gets wet, take it off and put on a dry strap.  If you use tape to hold the bag on your leg: 1. Use an alcohol wipe or soap and water to wash your skin where the tape made it sticky before. 2. Use a clean towel to pat-dry that skin. 3. Use new tape to make the bag stay on your leg. Wearing your bags You should have been given a large overnight bag.  You may wear the overnight bag in the day or night.  Always have the overnight bag lower than your bladder.  Do not let the bag touch the floor.  Before you go to sleep, put a clean plastic bag in a wastebasket. Then hang the overnight bag inside the wastebasket. You should also have a smaller leg bag that fits under your clothes.  Always wear the leg bag below your knee.  Do not wear your leg bag at night. How to care for your skin and catheter Supplies needed  A clean washcloth.  Water and mild soap.  A clean towel. Caring for your skin and catheter      Clean the skin around your catheter every day: 1. Wash your hands with soap and water. 2. Wet a clean washcloth in warm water and mild soap. 3. Clean the skin around your urethra.  If you are  male:  Gently spread the folds of skin around your vagina (labia).  With the washcloth in your other hand, wipe the inner side of your labia on each side. Wipe from front to back.  If you are male:  Pull back any skin that covers the end of your penis (foreskin).  With the washcloth in your other hand, wipe your penis in small circles. Start wiping at the tip of your penis, then move away from the catheter.  Move the foreskin back in place, if needed. 4. With your free hand, hold the catheter close to where it goes into your body.  Keep holding the catheter during cleaning so it does not get pulled out. 5. With the washcloth in your other hand, clean the catheter.  Only wipe downward on the catheter.  Do not wipe upward toward your body. Doing this may push germs into your urethra and cause infection. 6. Use a clean towel to pat-dry the catheter and the skin around it. Make sure to wipe off all soap. 7. Wash your hands with soap and water.  Shower every day. Do not take baths.  Do not use cream, ointment, or lotion on the area where the catheter goes into your body, unless your doctor tells you   to.  Do not use powders, sprays, or lotions on your genital area.  Check your skin around the catheter every day for signs of infection. Check for: ? Redness, swelling, or pain. ? Fluid or blood. ? Warmth. ? Pus or a bad smell. How to empty the bag Supplies needed  Rubbing alcohol.  Gauze pad or cotton ball.  Tape or a leg strap. Emptying the bag Pour the pee out of your bag when it is ?- full, or at least 2-3 times a day. Do this for your overnight bag and your leg bag. 1. Wash your hands with soap and water. 2. Separate (detach) the bag from your leg. 3. Hold the bag over the toilet or a clean pail. Keep the bag lower than your hips and bladder. This is so the pee (urine) does not go back into the tube. 4. Open the pour spout. It is at the bottom of the bag. 5. Empty the  pee into the toilet or pail. Do not let the pour spout touch any surface. 6. Put rubbing alcohol on a gauze pad or cotton ball. 7. Use the gauze pad or cotton ball to clean the pour spout. 8. Close the pour spout. 9. Attach the bag to your leg with tape or a leg strap. 10. Wash your hands with soap and water. Follow instructions for cleaning the drainage bag:  From the product maker.  As told by your doctor. How to change the bag Supplies needed  Alcohol wipes.  A clean bag.  Tape or a leg strap. Changing the bag Replace your bag when it starts to leak, smell bad, or look dirty. 1. Wash your hands with soap and water. 2. Separate the dirty bag from your leg. 3. Pinch the catheter with your fingers so that pee does not spill out. 4. Separate the catheter tube from the bag tube where these tubes connect (at the connection valve). Do not let the tubes touch any surface. 5. Clean the end of the catheter tube with an alcohol wipe. Use a different alcohol wipe to clean the end of the bag tube. 6. Connect the catheter tube to the tube of the clean bag. 7. Attach the clean bag to your leg with tape or a leg strap. Do not make the bag tight on your leg. 8. Wash your hands with soap and water. General rules   Never pull on your catheter. Never try to take it out. Doing that can hurt you.  Always wash your hands before and after you touch your catheter or bag. Use a mild, fragrance-free soap. If you do not have soap and water, use hand sanitizer.  Always make sure there are no twists or bends (kinks) in the catheter tube.  Always make sure there are no leaks in the catheter or bag.  Drink enough fluid to keep your pee pale yellow.  Do not take baths, swim, or use a hot tub.  If you are male, wipe from front to back after you poop (have a bowel movement). Contact a doctor if:  Your pee is cloudy.  Your pee smells worse than usual.  Your catheter gets clogged.  Your catheter  leaks.  Your bladder feels full. Get help right away if:  You have redness, swelling, or pain where the catheter goes into your body.  You have fluid, blood, pus, or a bad smell coming from the area where the catheter goes into your body.  Your skin feels warm where   where the catheter goes into your body.  You have a fever.  You have pain in your: ? Belly (abdomen). ? Legs. ? Lower back. ? Bladder.  You see blood in the catheter.  Your pee is pink or red.  You feel sick to your stomach (nauseous).  You throw up (vomit).  You have chills.  Your pee is not draining into the bag.  Your catheter gets pulled out. Summary  An indwelling urinary catheter is a thin tube that is placed into the bladder to help drain pee (urine) out of the body.  The catheter is placed into the part of the body that drains pee from the bladder (urethra).  Taking good care of your catheter will keep it working properly and help prevent problems.  Always wash your hands before and after touching your catheter or bag.  Never pull on your catheter or try to take it out. This information is not intended to replace advice given to you by your health care provider. Make sure you discuss any questions you have with your health care provider. Document Revised: 03/13/2019 Document Reviewed: 07/05/2017 Elsevier Patient Education  2020 [employer]     AMBULATORY SURGERY  DISCHARGE INSTRUCTIONS   1) The drugs that you were given will stay in your system until tomorrow so for the next 24 hours you should not:  A) Drive an automobile B) Make any legal decisions C) Drink any alcoholic beverage   2) You may resume regular meals tomorrow.  Today it is better to start with liquids and gradually work up to solid foods.  You may eat anything you prefer, but it is better to start with liquids, then soup and crackers, and gradually work up to solid foods.   3) Please notify your doctor immediately if  you have any unusual bleeding, trouble breathing, redness and pain at the surgery site, drainage, fever, or pain not relieved by medication.    4) Additional Instructions:        Please contact your physician with any problems or Same Day Surgery at [phone], Monday through Friday 6 am to 4 pm, or [place] [facility] [place] [facility] number at [phone]. 

## 2020-06-27 NOTE — Anesthesia Preprocedure Evaluation (Signed)
Anesthesia Evaluation  Patient identified by MRN, date of birth, ID band Patient awake    Reviewed: Allergy & Precautions, H&P , NPO status , Patient's Chart, lab work & pertinent test results  History of Anesthesia Complications Negative for: history of anesthetic complications  Airway Mallampati: III  TM Distance: >3 FB Neck ROM: limited    Dental  (+) Chipped, Poor Dentition   Pulmonary neg pulmonary ROS, neg shortness of breath,    Pulmonary exam normal        Cardiovascular Exercise Tolerance: Good hypertension, (-) angina(-) Past MI and (-) DOE Normal cardiovascular exam     Neuro/Psych PSYCHIATRIC DISORDERS negative neurological ROS  negative psych ROS   GI/Hepatic Neg liver ROS, GERD  Medicated and Controlled,  Endo/Other  negative endocrine ROS  Renal/GU      Musculoskeletal   Abdominal   Peds  Hematology negative hematology ROS (+)   Anesthesia Other Findings Past Medical History: No date: Allergic rhinitis No date: Anxiety No date: BPH (benign prostatic hyperplasia) No date: DDD (degenerative disc disease), cervical No date: Depression No date: ED (erectile dysfunction) No date: Exertional angina [facility])     Comment:  cardiologist-  [name]---  04-21-2013 abnormal               myoview with epicardial coronaries ;  per cardiac cath               05-01-2013 no sig. 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 No date: OA (osteoarthritis)     Comment:  knees, lumbar, fingers No date: Seasonal allergies No date: Skin cancer, basal cell No date: Systolic murmur  Past Surgical History: age 72: APPENDECTOMY No date: CARDIAC CATHETERIZATION     Comment:  no stent 07/2018: CATARACT EXTRACTION W/ INTRAOCULAR LENS  IMPLANT, BILATERAL 03/02/2014: COLONOSCOPY; N/A     Comment:  Procedure: COLONOSCOPY;  Surgeon: [name],               MD;  Location: [facility];  Service: Endoscopy;                Laterality: N/A; No date: EYE SURGERY; Bilateral     Comment:  cataract  No date: FINGER SURGERY     Comment:  11-14-2004 right long finger;  12-19-2007 left index               finger (both done by dr sypher) 03/02/2014: HOT HEMOSTASIS; N/A     Comment:  Procedure: HOT HEMOSTASIS (ARGON PLASMA               COAGULATION/BICAP);  Surgeon: [name], MD;                Location: [facility];  Service: Endoscopy;  Laterality:  N/A; left , early 2000s;  right 12/ 2003: INGUINAL HERNIA REPAIR; Bilateral     Comment:  inguinal hernia, with mesh No date: JOINT REPLACEMENT right- last one 1996;  left , last one ,yrs ago: KNEE ARTHROSCOPY;  Bilateral 1972: KNEE SURGERY; Left     Comment:  open 05/01/2013: LEFT HEART CATHETERIZATION WITH CORONARY ANGIOGRAM; N/A     Comment:  Procedure: LEFT HEART CATHETERIZATION WITH CORONARY               ANGIOGRAM;  Surgeon: [name], MD;  [facility]              CATH LAB;  Service: Cardiovascular;  Laterality: N/A; yrs ago: MOHS SURGERY     Comment:  nose 1968: RECONSTRUCTION OF NOSE 09/16/2018: TOTAL KNEE ARTHROPLASTY; Left     Comment:  Procedure: LEFT TOTAL KNEE ARTHROPLASTY;  Surgeon: [name], MD;  Location: [facility];  Service: Orthopedics;                Laterality: Left;  70 mins 04/28/2019: TOTAL KNEE ARTHROPLASTY; Right     Comment:  Procedure: TOTAL  KNEE ARTHROPLASTY;  Surgeon: [name], MD;  Location: [facility];  Service: Orthopedics;                Laterality: Right;  70 mins  BMI    Body Mass Index: 26.94 kg/m      Reproductive/Obstetrics negative OB ROS                             Anesthesia Physical Anesthesia Plan  ASA: III  Anesthesia Plan: General ETT   Post-op Pain Management:    Induction: Intravenous  PONV Risk Score and Plan: Ondansetron, Dexamethasone, Midazolam and Treatment may vary due to age or medical condition  Airway Management Planned: Oral ETT  Additional Equipment:   Intra-op Plan:   Post-operative Plan: Extubation in OR  Informed Consent: I have reviewed the patients History and Physical, chart, labs and discussed the procedure including the risks, benefits and alternatives for the proposed anesthesia with the patient or authorized representative who has indicated his/her understanding and acceptance.     Dental Advisory Given  Plan Discussed with: Anesthesiologist, CRNA and Surgeon  Anesthesia Plan Comments: (Patient consented for risks of anesthesia including but not limited to:  - adverse reactions to medications - damage to eyes, teeth, lips or other oral mucosa - nerve damage due to positioning  - sore throat or hoarseness - Damage to heart, brain, nerves, lungs, other parts of body or loss of life  Patient voiced understanding.)        Anesthesia Quick Evaluation

## 2020-06-28 ENCOUNTER — Encounter: Payer: Self-pay | Admitting: Urology

## 2020-06-28 ENCOUNTER — Telehealth (INDEPENDENT_AMBULATORY_CARE_PROVIDER_SITE_OTHER): Payer: Medicare Other | Admitting: Urology

## 2020-06-28 ENCOUNTER — Telehealth: Payer: Self-pay

## 2020-06-28 DIAGNOSIS — N401 Enlarged prostate with lower urinary tract symptoms: Secondary | ICD-10-CM | POA: Diagnosis not present

## 2020-06-28 DIAGNOSIS — N138 Other obstructive and reflux uropathy: Secondary | ICD-10-CM

## 2020-06-28 NOTE — Telephone Encounter (Signed)
Pt LM on triage line, called pt back he states that he is concerned about urine flowing backwards in his catheter tube. Pt states that his leg bag is full and draining fine. Pt denies pain, bleeding, or other symptoms. Advised pt that depending on his position the urine in his tube may flow in that direction. Pt gave verbal understanding.

## 2020-06-28 NOTE — Progress Notes (Signed)
This service is provided via telemedicine   No vital signs collected/recorded due to the encounter was a telemedicine visit.     Patient consents to a telephone visit:  yes    Names of all persons participating in the telemedicine service and their role in the encounter:  Verna Hamon, CMA   

## 2020-06-28 NOTE — Anesthesia Postprocedure Evaluation (Signed)
Anesthesia Post Note  Patient: Dale [name]  Procedure(s) Performed: CYSTOSCOPY WITH URETHRAL DILATATION (N/A Urethra)  Patient location during evaluation: PACU Anesthesia Type: General Level of consciousness: awake and alert Pain management: pain level controlled Vital Signs Assessment: post-procedure vital signs reviewed and stable Respiratory status: spontaneous breathing, nonlabored ventilation, respiratory function stable and patient connected to nasal cannula oxygen Cardiovascular status: blood pressure returned to baseline and stable Postop Assessment: no apparent nausea or vomiting Anesthetic complications: no   No complications documented.   Last Vitals:  Vitals:   06/27/20 1314 06/27/20 1351  BP: (!) 145/75 (!) 158/79  Pulse: 59 58  Resp: 18 16  Temp: (!) 36.1 C (!) 36.1 C  SpO2: 97% 98%    Last Pain:  Vitals:   06/27/20 1351  TempSrc: Temporal  PainSc: 4                  Precious Haws Cortnee Steinmiller

## 2020-06-29 ENCOUNTER — Ambulatory Visit: Payer: Medicare Other | Admitting: Physician Assistant

## 2020-06-30 ENCOUNTER — Other Ambulatory Visit: Payer: Self-pay

## 2020-06-30 ENCOUNTER — Encounter: Payer: Self-pay | Admitting: Physician Assistant

## 2020-06-30 ENCOUNTER — Other Ambulatory Visit
Admission: RE | Admit: 2020-06-30 | Discharge: 2020-06-30 | Disposition: A | Discharge status: Home / Self Care | Payer: Medicare Other | Source: Ambulatory Visit | Attending: Urology | Admitting: Urology

## 2020-06-30 ENCOUNTER — Other Ambulatory Visit: Payer: Self-pay | Admitting: Radiology

## 2020-06-30 ENCOUNTER — Ambulatory Visit (INDEPENDENT_AMBULATORY_CARE_PROVIDER_SITE_OTHER): Payer: Medicare Other | Admitting: Physician Assistant

## 2020-06-30 VITALS — BP 163/93 | HR 75 | Ht 67.0 in | Wt 170.0 lb

## 2020-06-30 DIAGNOSIS — Z20822 Contact with and (suspected) exposure to covid-19: Secondary | ICD-10-CM | POA: Diagnosis not present

## 2020-06-30 DIAGNOSIS — N35919 Unspecified urethral stricture, male, unspecified site: Secondary | ICD-10-CM

## 2020-06-30 DIAGNOSIS — Z01812 Encounter for preprocedural laboratory examination: Secondary | ICD-10-CM | POA: Diagnosis present

## 2020-06-30 LAB — SARS CORONAVIRUS 2 (TAT 6-24 HRS): SARS Coronavirus 2: NEGATIVE

## 2020-06-30 NOTE — Progress Notes (Signed)
Cath Change/ Replacement  Patient is present today for a catheter change due to urethral stricture s/p dilation.  27ml of water was removed from the balloon, a 16XW silicone council tip foley cath was removed without difficulty.  Patient was cleaned and prepped in a sterile fashion with betadine and 2% lidocaine jelly was instilled into the urethra. A 20 FR silicone coude foley cath was replaced into the bladder no complications were noted Urine return was noted 59ml and urine was pink in color. The balloon was filled with 8ml of sterile water. A leg bag was attached for drainage.  Patient declined additional drainage bags.  Performed by: Debroah Loop, PA-C   Follow up: Urolift with Dr. Erlene Quan on 07/04/2020. Counseled patient to start Bactrim today; he expressed understanding.

## 2020-07-04 ENCOUNTER — Encounter
Admission: RE | Disposition: A | Discharge status: Home / Self Care | Payer: Self-pay | Source: Ambulatory Visit | Attending: Urology

## 2020-07-04 ENCOUNTER — Encounter: Payer: Self-pay | Admitting: Urology

## 2020-07-04 ENCOUNTER — Ambulatory Visit: Payer: Medicare Other | Admitting: Anesthesiology

## 2020-07-04 ENCOUNTER — Ambulatory Visit
Admission: RE | Admit: 2020-07-04 | Discharge: 2020-07-04 | Disposition: A | Discharge status: Home / Self Care | Payer: Medicare Other | Source: Ambulatory Visit | Attending: Urology | Admitting: Urology

## 2020-07-04 DIAGNOSIS — Z85828 Personal history of other malignant neoplasm of skin: Secondary | ICD-10-CM | POA: Insufficient documentation

## 2020-07-04 DIAGNOSIS — N320 Bladder-neck obstruction: Secondary | ICD-10-CM | POA: Diagnosis not present

## 2020-07-04 DIAGNOSIS — N401 Enlarged prostate with lower urinary tract symptoms: Secondary | ICD-10-CM | POA: Insufficient documentation

## 2020-07-04 DIAGNOSIS — R338 Other retention of urine: Secondary | ICD-10-CM | POA: Insufficient documentation

## 2020-07-04 DIAGNOSIS — I10 Essential (primary) hypertension: Secondary | ICD-10-CM | POA: Insufficient documentation

## 2020-07-04 DIAGNOSIS — R3914 Feeling of incomplete bladder emptying: Secondary | ICD-10-CM | POA: Insufficient documentation

## 2020-07-04 DIAGNOSIS — N138 Other obstructive and reflux uropathy: Secondary | ICD-10-CM | POA: Insufficient documentation

## 2020-07-04 DIAGNOSIS — K219 Gastro-esophageal reflux disease without esophagitis: Secondary | ICD-10-CM | POA: Diagnosis not present

## 2020-07-04 DIAGNOSIS — Z96651 Presence of right artificial knee joint: Secondary | ICD-10-CM | POA: Diagnosis not present

## 2020-07-04 HISTORY — PX: CYSTOSCOPY WITH INSERTION OF UROLIFT: SHX6678

## 2020-07-04 SURGERY — CYSTOSCOPY WITH INSERTION OF UROLIFT
Anesthesia: General

## 2020-07-04 MED ORDER — ONDANSETRON HCL 4 MG/2ML IJ SOLN
INTRAMUSCULAR | Status: DC | PRN
  Administered 2020-07-04: 4 mg via INTRAVENOUS

## 2020-07-04 MED ORDER — ONDANSETRON HCL 4 MG/2ML IJ SOLN: 4.0000 mg | Freq: Once | INTRAMUSCULAR | Status: DC | PRN

## 2020-07-04 MED ORDER — PROPOFOL 10 MG/ML IV BOLUS
INTRAVENOUS | Status: AC
  Filled 2020-07-04: qty 40

## 2020-07-04 MED ORDER — PHENYLEPHRINE HCL (PRESSORS) 10 MG/ML IV SOLN
INTRAVENOUS | Status: DC | PRN
  Administered 2020-07-04 (×2): 100 ug via INTRAVENOUS
  Administered 2020-07-04: 200 ug via INTRAVENOUS

## 2020-07-04 MED ORDER — PHENAZOPYRIDINE HCL 200 MG PO TABS
200.0000 mg | ORAL_TABLET | Freq: Three times a day (TID) | ORAL | 0 refills | Status: DC | PRN
Start: 2020-07-04 — End: 2020-10-04

## 2020-07-04 MED ORDER — FENTANYL CITRATE (PF) 100 MCG/2ML IJ SOLN
25.0000 ug | INTRAMUSCULAR | Status: AC | PRN
  Administered 2020-07-04 (×4): 25 ug via INTRAVENOUS

## 2020-07-04 MED ORDER — ACETAMINOPHEN 10 MG/ML IV SOLN: 1000.0000 mg | Freq: Once | INTRAVENOUS | Status: DC | PRN

## 2020-07-04 MED ORDER — DEXAMETHASONE SODIUM PHOSPHATE 10 MG/ML IJ SOLN
INTRAMUSCULAR | Status: DC | PRN
  Administered 2020-07-04: 10 mg via INTRAVENOUS

## 2020-07-04 MED ORDER — GLYCOPYRROLATE 0.2 MG/ML IJ SOLN
INTRAMUSCULAR | Status: DC | PRN
  Administered 2020-07-04: .2 mg via INTRAVENOUS

## 2020-07-04 MED ORDER — OXYCODONE HCL 5 MG PO TABS: 5.0000 mg | ORAL_TABLET | Freq: Once | ORAL | Status: AC | PRN

## 2020-07-04 MED ORDER — ACETAMINOPHEN 500 MG PO TABS
ORAL_TABLET | ORAL | Status: AC
  Administered 2020-07-04: 1000 mg via ORAL
  Filled 2020-07-04: qty 2

## 2020-07-04 MED ORDER — LACTATED RINGERS IV SOLN: INTRAVENOUS | Status: DC

## 2020-07-04 MED ORDER — MIDAZOLAM HCL 2 MG/2ML IJ SOLN
INTRAMUSCULAR | Status: DC | PRN
  Administered 2020-07-04: 2 mg via INTRAVENOUS

## 2020-07-04 MED ORDER — OXYBUTYNIN CHLORIDE 5 MG PO TABS: 5.0000 mg | ORAL_TABLET | Freq: Three times a day (TID) | ORAL | 0 refills | Status: DC | PRN

## 2020-07-04 MED ORDER — ORAL CARE MOUTH RINSE: 15.0000 mL | Freq: Once | OROMUCOSAL | Status: AC

## 2020-07-04 MED ORDER — OXYCODONE HCL 5 MG PO TABS
5.0000 mg | ORAL_TABLET | Freq: Once | ORAL | Status: AC
  Filled 2020-07-04: qty 1

## 2020-07-04 MED ORDER — ACETAMINOPHEN 500 MG PO TABS: 1000.0000 mg | ORAL_TABLET | Freq: Once | ORAL | Status: AC

## 2020-07-04 MED ORDER — OXYCODONE HCL 5 MG/5ML PO SOLN: 5.0000 mg | Freq: Once | ORAL | Status: AC | PRN

## 2020-07-04 MED ORDER — EPHEDRINE SULFATE 50 MG/ML IJ SOLN
INTRAMUSCULAR | Status: DC | PRN
  Administered 2020-07-04 (×2): 10 mg via INTRAVENOUS
  Administered 2020-07-04: 5 mg via INTRAVENOUS

## 2020-07-04 MED ORDER — FENTANYL CITRATE (PF) 100 MCG/2ML IJ SOLN
INTRAMUSCULAR | Status: DC | PRN
  Administered 2020-07-04 (×2): 50 ug via INTRAVENOUS

## 2020-07-04 MED ORDER — OXYBUTYNIN CHLORIDE 5 MG PO TABS
ORAL_TABLET | ORAL | Status: AC
  Administered 2020-07-04: 5 mg via ORAL
  Filled 2020-07-04: qty 1

## 2020-07-04 MED ORDER — PROPOFOL 10 MG/ML IV BOLUS
INTRAVENOUS | Status: DC | PRN
  Administered 2020-07-04: 120 mg via INTRAVENOUS
  Administered 2020-07-04: 80 mg via INTRAVENOUS

## 2020-07-04 MED ORDER — LIDOCAINE HCL (CARDIAC) PF 100 MG/5ML IV SOSY
PREFILLED_SYRINGE | INTRAVENOUS | Status: DC | PRN
  Administered 2020-07-04: 100 mg via INTRAVENOUS

## 2020-07-04 MED ORDER — FENTANYL CITRATE (PF) 100 MCG/2ML IJ SOLN
INTRAMUSCULAR | Status: AC
  Administered 2020-07-04: 25 ug via INTRAVENOUS
  Filled 2020-07-04: qty 2

## 2020-07-04 MED ORDER — FENTANYL CITRATE (PF) 100 MCG/2ML IJ SOLN
INTRAMUSCULAR | Status: AC
  Filled 2020-07-04: qty 2

## 2020-07-04 MED ORDER — OXYCODONE HCL 5 MG PO TABS
ORAL_TABLET | ORAL | Status: AC
  Administered 2020-07-04: 5 mg via ORAL
  Filled 2020-07-04: qty 1

## 2020-07-04 MED ORDER — OXYBUTYNIN CHLORIDE 5 MG PO TABS
5.0000 mg | ORAL_TABLET | Freq: Once | ORAL | Status: AC
  Filled 2020-07-04: qty 1

## 2020-07-04 MED ORDER — CHLORHEXIDINE GLUCONATE 0.12 % MT SOLN: 15.0000 mL | Freq: Once | OROMUCOSAL | Status: AC

## 2020-07-04 MED ORDER — CHLORHEXIDINE GLUCONATE 0.12 % MT SOLN
OROMUCOSAL | Status: AC
  Administered 2020-07-04: 15 mL via OROMUCOSAL
  Filled 2020-07-04: qty 15

## 2020-07-04 MED ORDER — GLYCOPYRROLATE 0.2 MG/ML IJ SOLN
INTRAMUSCULAR | Status: AC
  Filled 2020-07-04: qty 1

## 2020-07-04 MED ORDER — ONDANSETRON HCL 4 MG/2ML IJ SOLN
INTRAMUSCULAR | Status: AC
  Filled 2020-07-04: qty 2

## 2020-07-04 MED ORDER — CIPROFLOXACIN IN D5W 400 MG/200ML IV SOLN
400.0000 mg | INTRAVENOUS | Status: AC
  Administered 2020-07-04: 400 mg via INTRAVENOUS

## 2020-07-04 MED ORDER — CIPROFLOXACIN IN D5W 400 MG/200ML IV SOLN
INTRAVENOUS | Status: AC
  Filled 2020-07-04: qty 200

## 2020-07-04 SURGICAL SUPPLY — 12 items
BAG DRAIN CYSTO-URO LG1000N (MISCELLANEOUS) ×3 IMPLANT
GLOVE BIO SURGEON STRL SZ 6.5 (GLOVE) ×2 IMPLANT
GLOVE BIO SURGEONS STRL SZ 6.5 (GLOVE) ×1
GOWN STRL REUS W/ TWL LRG LVL3 (GOWN DISPOSABLE) ×2 IMPLANT
GOWN STRL REUS W/TWL LRG LVL3 (GOWN DISPOSABLE) ×6
KIT TURNOVER CYSTO (KITS) ×3 IMPLANT
PACK CYSTO AR (MISCELLANEOUS) ×3 IMPLANT
SET CYSTO W/LG BORE CLAMP LF (SET/KITS/TRAYS/PACK) ×3 IMPLANT
SURGILUBE 2OZ TUBE FLIPTOP (MISCELLANEOUS) IMPLANT
SYSTEM UROLIFT (Male Continence) ×18 IMPLANT
WATER STERILE IRR 1000ML POUR (IV SOLUTION) ×3 IMPLANT
WATER STERILE IRR 3000ML UROMA (IV SOLUTION) ×3 IMPLANT

## 2020-07-04 NOTE — Anesthesia Postprocedure Evaluation (Signed)
Anesthesia Post Note  Patient: Dale [name]  Procedure(s) Performed: CYSTOSCOPY WITH INSERTION OF UROLIFT (N/A )  Patient location during evaluation: PACU Anesthesia Type: General Level of consciousness: awake and alert Pain management: pain level controlled Vital Signs Assessment: post-procedure vital signs reviewed and stable Respiratory status: spontaneous breathing, nonlabored ventilation, respiratory function stable and patient connected to nasal cannula oxygen Cardiovascular status: blood pressure returned to baseline and stable Postop Assessment: no apparent nausea or vomiting Anesthetic complications: no   No complications documented.   Last Vitals:  Vitals:   07/04/20 1248 07/04/20 1253  BP: (!) 172/99   Pulse: 79 80  Resp: 12 18  Temp:    SpO2: 93% 93%    Last Pain:  Vitals:   07/04/20 1253  TempSrc:   PainSc: 5                  Arita Miss

## 2020-07-04 NOTE — Discharge Instructions (Signed)
Urolift Post-Operative Instructions     Patient Expectations   1. Mild blood in your urine for about 1 week.  2. Urinary buring, frequency, and urgency for 10 days.  3. Mild pelvic pain 1-2 weeks.     Return to Activity     1. Drink water post procedure.  2. Take meds as needed.  Tylenol and/or Motrin is most helpful.  You may also by Pyridium/Azo over-the-counter for urinary burning.  3. No lifting or straining 48hrs.  4. Other activity when they feel up to it.  5. Remove Foley on Wed AM, 30 cc are in the balloon  [place]   1) The drugs that you were given will stay in your system until tomorrow so for the next 24 hours you should not:  A) Drive an automobile B) Make any legal decisions C) Drink any alcoholic beverage   2) You may resume regular meals tomorrow.  Today it is better to start with liquids and gradually work up to solid foods.  You may eat anything you prefer, but it is better to start with liquids, then soup and crackers, and gradually work up to solid foods.   3) Please notify your doctor immediately if you have any unusual bleeding, trouble breathing, redness and pain at the surgery site, drainage, fever, or pain not relieved by medication.    4) Additional Instructions:        Please contact your physician with any problems or Same Day Surgery at [phone], Monday through Friday 6 am to 4 pm, or [place] at [facility] number at [phone].

## 2020-07-04 NOTE — Progress Notes (Signed)
Pt continues to have pain after fentanyl 150 mcg and oxycodone 5mg . Dr. Bertell Maria notified. Acknowledged. Orders received.

## 2020-07-04 NOTE — Interval H&P Note (Signed)
History and Physical Interval Note:  07/04/2020 10:29 AM  Dale [name]  has presented today for surgery, with the diagnosis of benign prostatic hypertrophy with bladder outlet obstruction.  The various methods of treatment have been discussed with the patient and family. After consideration of risks, benefits and other options for treatment, the patient has consented to  Procedure(s): CYSTOSCOPY WITH INSERTION OF UROLIFT (N/A) as a surgical intervention.  The patient's history has been reviewed, patient examined, no change in status, stable for surgery.  I have reviewed the patient's chart and labs.  Questions were answered to the patient's satisfaction.    RRR CTAB     Hollice Espy

## 2020-07-04 NOTE — Transfer of Care (Signed)
Immediate Anesthesia Transfer of Care Note  Patient: Dale [name]  Procedure(s) Performed: CYSTOSCOPY WITH INSERTION OF UROLIFT (N/A )  Patient Location: PACU  Anesthesia Type:General  Level of Consciousness: drowsy  Airway & Oxygen Therapy: Patient Spontanous Breathing and Patient connected to face mask oxygen  Post-op Assessment: Report given to RN and Post -op Vital signs reviewed and stable  Post vital signs: Reviewed and stable  Last Vitals:  Vitals Value Taken Time  BP 107/58 07/04/20 1134  Temp 36.1 C 07/04/20 1134  Pulse 88 07/04/20 1134  Resp 12 07/04/20 1134  SpO2 94 % 07/04/20 1134    Last Pain:  Vitals:   07/04/20 1134  TempSrc:   PainSc: Asleep         Complications: No complications documented.

## 2020-07-04 NOTE — Anesthesia Preprocedure Evaluation (Signed)
Anesthesia Evaluation  Patient identified by MRN, date of birth, ID band Patient awake    Reviewed: Allergy & Precautions, H&P , NPO status , Patient's Chart, lab work & pertinent test results  History of Anesthesia Complications Negative for: history of anesthetic complications  Airway Mallampati: I  TM Distance: >3 FB Neck ROM: limited    Dental  (+) Chipped, Teeth Intact, Dental Advisory Given   Pulmonary neg pulmonary ROS, neg shortness of breath,    Pulmonary exam normal breath sounds clear to auscultation       Cardiovascular Exercise Tolerance: Good hypertension, (-) angina(-) Past MI and (-) DOE Normal cardiovascular exam Rhythm:Regular Rate:Normal - Systolic murmurs    Neuro/Psych PSYCHIATRIC DISORDERS Anxiety Depression negative neurological ROS     GI/Hepatic Neg liver ROS, GERD  Medicated and Controlled,  Endo/Other  negative endocrine ROS  Renal/GU      Musculoskeletal   Abdominal   Peds  Hematology negative hematology ROS (+)   Anesthesia Other Findings Past Medical History: No date: Allergic rhinitis No date: Anxiety No date: BPH (benign prostatic hyperplasia) No date: DDD (degenerative disc disease), cervical No date: Depression No date: ED (erectile dysfunction) No date: Exertional angina [facility])     Comment:  cardiologist-  [name]---  04-21-2013 abnormal               myoview with epicardial coronaries ;  per cardiac cath               05-01-2013 no sig. obstructive CAD (LAD calcification,               diffuse plaqueing) ;  evidence dRCA and LV branch               vasoconstriction No date: GERD (gastroesophageal reflux disease) 06/2020: Gout     Comment:  recent episode a few weeks ago No date: History of adenomatous polyp of colon No date: History of basal cell carcinoma (BCC) excision No date: History of blood transfusion No date: History of squamous cell carcinoma excision      Comment:  09/ 27/ 2019  right lower leg  No date: HTN (hypertension) No date: Hypercholesteremia No date: Hypogonadism in male No date: OA (osteoarthritis)     Comment:  knees, lumbar, fingers No date: Seasonal allergies No date: Skin cancer, basal cell No date: Systolic murmur  Past Surgical History: age 75: APPENDECTOMY No date: CARDIAC CATHETERIZATION     Comment:  no stent 07/2018: CATARACT EXTRACTION W/ INTRAOCULAR LENS  IMPLANT, BILATERAL 03/02/2014: COLONOSCOPY; N/A     Comment:  Procedure: COLONOSCOPY;  Surgeon: [name],               MD;  Location: [facility];  Service: Endoscopy;                Laterality: N/A; No date: EYE SURGERY; Bilateral     Comment:  cataract  No date: FINGER SURGERY     Comment:  11-14-2004 right long finger;  12-19-2007 left index               finger (both done by dr sypher) 03/02/2014: HOT HEMOSTASIS; N/A     Comment:  Procedure: HOT HEMOSTASIS (ARGON PLASMA               COAGULATION/BICAP);  Surgeon: [name], MD;                Location: [facility];  Service: Endoscopy;  Laterality:              N/A; left , early 2000s;  right 12/ 2003: INGUINAL HERNIA REPAIR; Bilateral     Comment:  inguinal hernia, with mesh No date: JOINT REPLACEMENT right- last one 1996;  left , last one ,yrs ago: KNEE ARTHROSCOPY;  Bilateral 1972: KNEE SURGERY; Left     Comment:  open 05/01/2013: LEFT HEART CATHETERIZATION WITH CORONARY ANGIOGRAM; N/A     Comment:  Procedure: LEFT HEART CATHETERIZATION WITH CORONARY               ANGIOGRAM;  Surgeon: [name], MD;  [facility]              CATH LAB;  Service: Cardiovascular;  Laterality: N/A; yrs ago: MOHS SURGERY     Comment:  nose 1968: RECONSTRUCTION OF NOSE 09/16/2018: TOTAL KNEE ARTHROPLASTY; Left     Comment:  Procedure: LEFT TOTAL KNEE ARTHROPLASTY;  Surgeon: [name], MD;  Location: [facility];  Service: Orthopedics;                Laterality: Left;  70  mins 04/28/2019: TOTAL KNEE ARTHROPLASTY; Right     Comment:  Procedure: TOTAL KNEE ARTHROPLASTY;  Surgeon: [name], MD;  Location: [facility];  Service: Orthopedics;                Laterality: Right;  70 mins  BMI    Body Mass Index: 26.94 kg/m      Reproductive/Obstetrics negative OB ROS                             Anesthesia Physical  Anesthesia Plan  ASA: II  Anesthesia Plan: General   Post-op Pain Management:    Induction: Intravenous  PONV Risk Score and Plan: 3 and Ondansetron, Dexamethasone and Treatment may vary due to age or medical condition  Airway Management Planned: LMA  Additional Equipment: None  Intra-op Plan:   Post-operative Plan: Extubation in OR  Informed Consent: I have reviewed the patients History and Physical, chart, labs and discussed the procedure including the risks, benefits and alternatives for the proposed anesthesia with the patient or authorized representative who has indicated his/her understanding and acceptance.     Dental Advisory Given  Plan Discussed with: CRNA and Surgeon  Anesthesia Plan Comments: (Discussed risks of anesthesia with patient, including PONV, sore throat, lip/dental damage. Rare risks discussed as well, such as cardiorespiratory and neurological sequelae. Patient understands.)        Anesthesia Quick Evaluation

## 2020-07-04 NOTE — Anesthesia Procedure Notes (Signed)
Procedure Name: LMA Insertion Performed by: Demetrius Charity, CRNA Pre-anesthesia Checklist: Patient identified, Patient being monitored, Timeout performed, Emergency Drugs available and Suction available Patient Re-evaluated:Patient Re-evaluated prior to induction Oxygen Delivery Method: Circle system utilized Preoxygenation: Pre-oxygenation with 100% oxygen Induction Type: IV induction Ventilation: Mask ventilation without difficulty LMA: LMA inserted LMA Size: 4.0 Tube type: Oral Number of attempts: 1 Placement Confirmation: positive ETCO2 and breath sounds checked- equal and bilateral Tube secured with: Tape Dental Injury: Teeth and Oropharynx as per pre-operative assessment

## 2020-07-04 NOTE — Op Note (Signed)
Preoperative diagnosis: BPH with obstructive symptomatology   Postoperative diagnosis: BPH with obstructive symptomatology   Principal procedure: Urolift procedure, with the placement of 6 implants.   Surgeon: Hollice Espy   Anesthesia: LMA   Complications: None   Drains: None   Estimated blood loss: < 5 mL   Indications: 75 year-old male with obstructive symptomatology secondary to BPH.  The patient's symptoms have progressed, and he has requested further management.  Management options including TURP with resection/ablation of the prostate as well as Urolift were discussed.  The patient has chosen to have a Urolift procedure.  He has been instructed to the procedure as well as risks and complications which include but are not limited to infection, bleeding, and inadequate treatment with the Urolift procedure alone, anesthetic complications, among others.  He understands these and desires to proceed.   Findings: Using the 17 French cystoscope, urethra and bladder were inspected.  There were no urethral lesions.  Prostatic urethra was obstructed secondary to bilobar hypertrophy.  The bladder was inspected circumferentially.  This revealed normal findings.  The urethra still had a very small area of shallow superficial erosion within the pendulous urethra but it healed remarkably well for the most part.   Description of procedure: The patient was properly identified in the holding area.  He received preoperative IV antibiotics.  He was taken to the operating room where general anesthetic was administered with the LMA.  He is placed in the dorsolithotomy position.  Genitalia and perineum were prepped and draped.  Proper timeout was performed.   A 74F cystoscope was inserted into the bladder with findings as described above.  The 1st pair of implants were placed at the bladder neck ~1.5 cm from the bladder Neck. The 2nd pair of implants were placed at the level of the verumontanum.   A repeat  cysto was performed and another pair of implants in between the prior pairs.    A final cystoscopy was conducted first to inspect the location and state of each implant and second, to confirm the presence of a continuous anterior channel was present through the prostatic urethra with irrigation flow turned off.    Six implants were delivered in total.    Following this, the scope was removed.  I elected to replace a 61 French Foley catheter, nonlatex with a 30 cc balloon given his history of previous urethral trauma from attempted dilation last week as well as significant prostamegaly which will stay for 2 days.  After anesthetic reversal he was transported to the PACU in stable condition.  He tolerated the procedure well.   Plan: He will follow-up with me in 2 weeks for IPSS/PVR.

## 2020-07-05 ENCOUNTER — Encounter: Payer: Self-pay | Admitting: Urology

## 2020-07-11 ENCOUNTER — Encounter: Payer: Self-pay | Admitting: Urology

## 2020-07-12 ENCOUNTER — Other Ambulatory Visit: Payer: Self-pay

## 2020-07-12 ENCOUNTER — Encounter: Payer: Self-pay | Admitting: Physician Assistant

## 2020-07-12 ENCOUNTER — Ambulatory Visit (INDEPENDENT_AMBULATORY_CARE_PROVIDER_SITE_OTHER): Payer: Medicare Other | Admitting: Physician Assistant

## 2020-07-12 VITALS — BP 166/79 | HR 71 | Ht 67.0 in | Wt 170.0 lb

## 2020-07-12 DIAGNOSIS — R350 Frequency of micturition: Secondary | ICD-10-CM

## 2020-07-12 DIAGNOSIS — R300 Dysuria: Secondary | ICD-10-CM | POA: Diagnosis not present

## 2020-07-12 LAB — URINALYSIS, COMPLETE
Bilirubin, UA: NEGATIVE
Glucose, UA: NEGATIVE
Ketones, UA: NEGATIVE
Leukocytes,UA: NEGATIVE
Nitrite, UA: NEGATIVE
Protein,UA: NEGATIVE
Specific Gravity, UA: 1.02 (ref 1.005–1.030)
Urobilinogen, Ur: 0.2 mg/dL (ref 0.2–1.0)
pH, UA: 5.5 (ref 5.0–7.5)

## 2020-07-12 LAB — MICROSCOPIC EXAMINATION: Bacteria, UA: NONE SEEN

## 2020-07-12 LAB — BLADDER SCAN AMB NON-IMAGING

## 2020-07-12 NOTE — Progress Notes (Signed)
07/12/2020 2:07 PM   Dale [name] 05/12/1946 [id number]  CC: Chief Complaint  Patient presents with  . Urinary Frequency    rule out UTI   HPI: Dale [name] is a 73 y.o. 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 Erlene Quan 8 days ago with placement of 6 implants who presents today for evaluation of dysuria and weak stream.  Today he reports taking his catheter out at home on POD 2.  He states his urinary stream was initially strong but has progressively weakened.  He feels he is straining to urinate which is caused some discomfort in his rectum.  He reports a history of rectal pain that he attributes to his levator ani muscle.  He states when he massages it he gets relief.  He reports stopping oxybutynin yesterday morning with relief of constipation. He has been taking Flomax continuously.  In-office UA today positive for 1+ blood; urine microscopy pan-negative. PVR 126mL.  PMH: Past Medical History:  Diagnosis Date  . Allergic rhinitis   . Anxiety   . BPH (benign prostatic hyperplasia)   . DDD (degenerative disc disease), cervical   . Depression   . ED (erectile dysfunction)   . Exertional angina [facility])    cardiologist-  [name]---  04-21-2013 abnormal myoview with epicardial coronaries ;  per cardiac cath 05-01-2013 no sig. obstructive CAD (LAD calcification, diffuse plaqueing) ;  evidence dRCA and LV branch vasoconstriction  . GERD (gastroesophageal reflux disease)   . Gout 06/2020   recent episode a few weeks ago  . History of adenomatous polyp of colon   . History of basal cell carcinoma (BCC) excision   . History of blood transfusion   . History of squamous cell carcinoma excision    09/ 27/ 2019  right lower leg   . HTN (hypertension)   . Hypercholesteremia   . Hypogonadism in male   . OA (osteoarthritis)    knees, lumbar, fingers  . Seasonal allergies   . Skin cancer, basal cell   . Systolic  murmur     Surgical History: Past Surgical History:  Procedure Laterality Date  . APPENDECTOMY  age 51  . CARDIAC CATHETERIZATION     no stent  . CATARACT EXTRACTION W/ INTRAOCULAR LENS  IMPLANT, BILATERAL  07/2018  . COLONOSCOPY N/A 03/02/2014   Procedure: COLONOSCOPY;  Surgeon: [name], MD;  Location: [facility];  Service: Endoscopy;  Laterality: N/A;  . CYSTOSCOPY WITH INSERTION OF UROLIFT N/A 07/04/2020   Procedure: CYSTOSCOPY WITH INSERTION OF UROLIFT;  Surgeon: Hollice Espy, MD;  Location: [facility];  Service: Urology;  Laterality: N/A;  . CYSTOSCOPY WITH URETHRAL DILATATION N/A 06/27/2020   Procedure: CYSTOSCOPY WITH URETHRAL DILATATION;  Surgeon: Hollice Espy, MD;  Location: [facility] ORS;  Service: Urology;  Laterality: N/A;  . EYE SURGERY Bilateral    cataract   . FINGER SURGERY     11-14-2004 right long finger;  12-19-2007 left index finger (both done by dr sypher)  . HOT HEMOSTASIS N/A 03/02/2014   Procedure: HOT HEMOSTASIS (ARGON PLASMA COAGULATION/BICAP);  Surgeon: [name], MD;  Location: [facility];  Service: Endoscopy;  Laterality: N/A;  . INGUINAL HERNIA REPAIR Bilateral left , early 2000s;  right 12/ 2003   inguinal hernia, with mesh  . JOINT REPLACEMENT    . KNEE ARTHROSCOPY Bilateral right- last one 1996;  left , last one ,yrs ago  . KNEE SURGERY Left  1972   open  . LEFT HEART CATHETERIZATION WITH CORONARY ANGIOGRAM N/A 05/01/2013   Procedure: LEFT HEART CATHETERIZATION WITH CORONARY ANGIOGRAM;  Surgeon: [name], MD;  Location: [facility] CATH LAB;  Service: Cardiovascular;  Laterality: N/A;  . MOHS SURGERY  yrs ago   nose  . RECONSTRUCTION OF NOSE  1968  . TOTAL KNEE ARTHROPLASTY Left 09/16/2018   Procedure: LEFT TOTAL KNEE ARTHROPLASTY;  Surgeon: [name], MD;  Location: [facility];  Service: Orthopedics;  Laterality: Left;  70 mins  . TOTAL KNEE ARTHROPLASTY Right 04/28/2019   Procedure: TOTAL KNEE ARTHROPLASTY;  Surgeon: [name], MD;  Location: [facility];  Service: Orthopedics;  Laterality: Right;  70 mins    Home Medications:  Allergies as of 07/12/2020      Reactions   Latex Itching   Redness. Has never had RAST testing. Bandaids/ dressings causes the itching and redness.   Penicillins Itching, Rash   Fever. TOLERATED CEPHALEXIN. Has patient had a PCN reaction causing immediate rash, facial/tongue/throat swelling, SOB or lightheadedness with hypotension: No Has patient had a PCN reaction causing severe rash involving mucus membranes or skin necrosis: No Has patient had a PCN reaction that required hospitalization: No Has patient had a PCN reaction occurring within the last 10 years: No If all of the above answers are "NO", then may proceed with Cephalosporin use.   Pneumococcal Vaccines Swelling, Rash, Other (See Comments)   Low grade fever. Puffy at sight of injection      Medication List       Accurate as of July 12, 2020  2:07 PM. If you have any questions, ask your nurse or doctor.        STOP taking these medications   HYDROcodone-acetaminophen 5-325 MG tablet Commonly known as: NORCO/VICODIN Stopped by: Debroah Loop, PA-C     TAKE these medications   atorvastatin 10 MG tablet Commonly known as: LIPITOR Take 10 mg by mouth at bedtime.   B-D 3CC LUER-LOK SYR 22GX1" 22G X 1" 3 ML Misc Generic drug: SYRINGE-NEEDLE (DISP) 3 ML See admin instructions.   cholecalciferol 1000 units tablet Commonly known as: VITAMIN D Take 1,000 Units by mouth daily.   colchicine 0.6 MG tablet Take 0.6-1.2 mg by mouth See admin instructions. Take 2 tablets at onset of gout attack then 1 tablet daily as needed for pain   fluticasone 50 MCG/ACT nasal spray Commonly known as: FLONASE Place 1 spray into the nose daily as needed for rhinitis or allergies.   hydrocortisone cream 1 % Apply 1 application topically daily as needed for itching.   ibuprofen 200 MG tablet Commonly known as:  ADVIL Take 400 mg by mouth every 6 (six) hours as needed for moderate pain.   lisinopril-hydrochlorothiazide 20-25 MG tablet Commonly known as: ZESTORETIC TAKE 1 TABLET BY MOUTH EVERY DAY   methocarbamol 500 MG tablet Commonly known as: Robaxin Take 1 tablet (500 mg total) by mouth every 6 (six) hours as needed for muscle spasms.   metoprolol succinate 50 MG 24 hr tablet Commonly known as: TOPROL-XL TAKE 1 TABLET (50 MG TOTAL) BY MOUTH DAILY.   oxybutynin 5 MG tablet Commonly known as: DITROPAN Take 1 tablet (5 mg total) by mouth every 8 (eight) hours as needed for bladder spasms.   pantoprazole 40 MG tablet Commonly known as: PROTONIX Take 40 mg by mouth daily as needed (heartburn).   phenazopyridine 200 MG tablet Commonly known as: Pyridium Take 1 tablet (200 mg total) by  mouth 3 (three) times daily as needed for pain.   tamsulosin 0.4 MG Caps capsule Commonly known as: FLOMAX Take 0.4 mg by mouth at bedtime.   testosterone cypionate 200 MG/ML injection Commonly known as: DEPOTESTOSTERONE CYPIONATE Inject 100 mg into the muscle once a week.   TUMS E-X PO Take 2 tablets by mouth daily as needed (heartburn).       Allergies:  Allergies  Allergen Reactions  . Latex Itching    Redness. Has never had RAST testing. Bandaids/ dressings causes the itching and redness.  Marland Kitchen Penicillins Itching and Rash    Fever. TOLERATED CEPHALEXIN. Has patient had a PCN reaction causing immediate rash, facial/tongue/throat swelling, SOB or lightheadedness with hypotension: No Has patient had a PCN reaction causing severe rash involving mucus membranes or skin necrosis: No Has patient had a PCN reaction that required hospitalization: No Has patient had a PCN reaction occurring within the last 10 years: No If all of the above answers are "NO", then may proceed with Cephalosporin use.   . Pneumococcal Vaccines Swelling, Rash and Other (See Comments)    Low grade fever. Puffy at sight of  injection    Family History: Family History  Problem Relation Age of Onset  . Heart disease Father   . Hypertension Mother   . Stroke Mother     Social History:   reports that he has never smoked. He has never used smokeless tobacco. He reports that he does not drink alcohol and does not use drugs.  Physical Exam: BP (!) 166/79   Pulse 71   Ht 5\' 7"  (1.702 m)   Wt 170 lb (77.1 kg)   BMI 26.63 kg/m   Constitutional:  Alert and oriented, no acute distress, nontoxic appearing HEENT: [place], AT Cardiovascular: No clubbing, cyanosis, or edema Respiratory: Normal respiratory effort, no increased work of breathing Skin: No rashes, bruises or suspicious lesions Neurologic: Grossly intact, no focal deficits, moving all 4 extremities Psychiatric: Normal mood and affect  Laboratory Data: Results for orders placed or performed in visit on 07/12/20  Microscopic Examination   Urine  Result Value Ref Range   WBC, UA 0-5 0 - 5 /hpf   RBC 0-2 0 - 2 /hpf   Epithelial Cells (non renal) 0-10 0 - 10 /hpf   Casts Present (A) None seen /lpf   Cast Type Hyaline casts N/A   Bacteria, UA None seen None seen/Few  Urinalysis, Complete  Result Value Ref Range   Specific Gravity, UA 1.020 1.005 - 1.030   pH, UA 5.5 5.0 - 7.5   Color, UA Yellow Yellow   Appearance Ur Clear Clear   Leukocytes,UA Negative Negative   Protein,UA Negative Negative/Trace   Glucose, UA Negative Negative   Ketones, UA Negative Negative   RBC, UA 1+ (A) Negative   Bilirubin, UA Negative Negative   Urobilinogen, Ur 0.2 0.2 - 1.0 mg/dL   Nitrite, UA Negative Negative   Microscopic Examination See below:   BLADDER SCAN AMB NON-IMAGING  Result Value Ref Range   Scan Result 125ml    Assessment & Plan:   1. Dysuria UA, PVR reassuring for infection or recurrent retention, respectively. Counseled patient these are normal postoperative findings and I expect his urinary stream will improve off oxybutynin. He is scheduled for  next follow-up with Dr. Erlene Quan in 1 week; counseled him to return to clinic sooner with worsening symptoms. He expressed understanding. - Urinalysis, Complete - BLADDER SCAN AMB NON-IMAGING   Return if symptoms  worsen or fail to improve.  Debroah Loop, PA-C  [facility] [address], [place], [place] [address] [phone]

## 2020-07-18 NOTE — Progress Notes (Signed)
07/19/2020 1:27 PM   Dale [name] [birth year] [id number]  Referring provider: [name], MD [address] [place],  [place] [address] Chief Complaint  Patient presents with  . Post-op Follow-up    HPI: Dale [name] is a 73 y.o. male who returns 2 weeks post op Urolift 07/04/2020. Patient has a history of incomplete bladder emptying/urniary rentention, elelvated PSA and rUTI.   Patient underwent attempted HoLEP on 06/27/2020. Unable to advance necessary 26 Fr scope in order to accomplish HoLEP. Decision made to place council tip type catheter over wire, 24 French to allow urethra to heal and passive dilation (see operative note).   He returned to the operating room on 07/04/2020 for urolift. patient was discharged with a catheter. Patient removed catheter himself.   He saw Debroah Loop, PA-C on 07/12/2020. PVR was 103 mL. He had a good stream. Reported constipation. No sings of infection. He was advised to stop oxybutynin in light of struggling to empty.  Today he notes a good stream. Notes some burning and stinging with urination. He remains on Flomax.   Overall, he is dramatically better than when he saw our PA a week ago.  IPSS as below.  Emptying is also improved, PVR 0.  He has some discomfort near his rectum which he attributes to sitting a lot.  He notes that when he massages his levator muscle, his symptoms improved.    IPSS    Row Name 07/19/20 1100         International Prostate Symptom Score   How often have you had the sensation of not emptying your bladder? Not at All     How often have you had to urinate less than every two hours? Less than 1 in 5 times     How often have you found you stopped and started again several times when you urinated? Less than 1 in 5 times     How often have you found it difficult to postpone urination? Less than 1 in 5 times     How often have you had a weak urinary stream? Less than 1 in 5 times      How often have you had to strain to start urination? Not at All     How many times did you typically get up at night to urinate? 1 Time     Total IPSS Score 5       Quality of Life due to urinary symptoms   If you were to spend the rest of your life with your urinary condition just the way it is now how would you feel about that? Pleased            Score:  1-7 Mild 8-19 Moderate 20-35 Severe   PMH: Past Medical History:  Diagnosis Date  . Allergic rhinitis   . Anxiety   . BPH (benign prostatic hyperplasia)   . DDD (degenerative disc disease), cervical   . Depression   . ED (erectile dysfunction)   . Exertional angina [facility])    cardiologist-  [name]---  04-21-2013 abnormal myoview with epicardial coronaries ;  per cardiac cath 05-01-2013 no sig. obstructive CAD (LAD calcification, diffuse plaqueing) ;  evidence dRCA and LV branch vasoconstriction  . GERD (gastroesophageal reflux disease)   . Gout 06/2020   recent episode a few weeks ago  . History of adenomatous polyp of colon   . History of basal cell carcinoma (BCC) excision   . History  of blood transfusion   . History of squamous cell carcinoma excision    09/ 27/ 2019  right lower leg   . HTN (hypertension)   . Hypercholesteremia   . Hypogonadism in male   . OA (osteoarthritis)    knees, lumbar, fingers  . Seasonal allergies   . Skin cancer, basal cell   . Systolic murmur     Surgical History: Past Surgical History:  Procedure Laterality Date  . APPENDECTOMY  age 54  . CARDIAC CATHETERIZATION     no stent  . CATARACT EXTRACTION W/ INTRAOCULAR LENS  IMPLANT, BILATERAL  07/2018  . COLONOSCOPY N/A 03/02/2014   Procedure: COLONOSCOPY;  Surgeon: [name], MD;  Location: [facility];  Service: Endoscopy;  Laterality: N/A;  . CYSTOSCOPY WITH INSERTION OF UROLIFT N/A 07/04/2020   Procedure: CYSTOSCOPY WITH INSERTION OF UROLIFT;  Surgeon: Hollice Espy, MD;  Location: [facility] ORS;  Service: Urology;   Laterality: N/A;  . CYSTOSCOPY WITH URETHRAL DILATATION N/A 06/27/2020   Procedure: CYSTOSCOPY WITH URETHRAL DILATATION;  Surgeon: Hollice Espy, MD;  Location: [facility] ORS;  Service: Urology;  Laterality: N/A;  . EYE SURGERY Bilateral    cataract   . FINGER SURGERY     11-14-2004 right long finger;  12-19-2007 left index finger (both done by dr sypher)  . HOT HEMOSTASIS N/A 03/02/2014   Procedure: HOT HEMOSTASIS (ARGON PLASMA COAGULATION/BICAP);  Surgeon: [name], MD;  Location: [facility];  Service: Endoscopy;  Laterality: N/A;  . INGUINAL HERNIA REPAIR Bilateral left , early 2000s;  right 12/ 2003   inguinal hernia, with mesh  . JOINT REPLACEMENT    . KNEE ARTHROSCOPY Bilateral right- last one 1996;  left , last one ,yrs ago  . KNEE SURGERY Left 1972   open  . LEFT HEART CATHETERIZATION WITH CORONARY ANGIOGRAM N/A 05/01/2013   Procedure: LEFT HEART CATHETERIZATION WITH CORONARY ANGIOGRAM;  Surgeon: [name], MD;  Location: [facility] CATH LAB;  Service: Cardiovascular;  Laterality: N/A;  . MOHS SURGERY  yrs ago   nose  . RECONSTRUCTION OF NOSE  1968  . TOTAL KNEE ARTHROPLASTY Left 09/16/2018   Procedure: LEFT TOTAL KNEE ARTHROPLASTY;  Surgeon: [name], MD;  Location: [facility];  Service: Orthopedics;  Laterality: Left;  70 mins  . TOTAL KNEE ARTHROPLASTY Right 04/28/2019   Procedure: TOTAL KNEE ARTHROPLASTY;  Surgeon: [name], MD;  Location: [facility];  Service: Orthopedics;  Laterality: Right;  70 mins    Home Medications:  Allergies as of 07/19/2020      Reactions   Latex Itching   Redness. Has never had RAST testing. Bandaids/ dressings causes the itching and redness.   Penicillins Itching, Rash   Fever. TOLERATED CEPHALEXIN. Has patient had a PCN reaction causing immediate rash, facial/tongue/throat swelling, SOB or lightheadedness with hypotension: No Has patient had a PCN reaction causing severe rash involving mucus membranes or skin necrosis: No Has patient  had a PCN reaction that required hospitalization: No Has patient had a PCN reaction occurring within the last 10 years: No If all of the above answers are "NO", then may proceed with Cephalosporin use.   Pneumococcal Vaccines Swelling, Rash, Other (See Comments)   Low grade fever. Puffy at sight of injection      Medication List       Accurate as of July 19, 2020  1:27 PM. If you have any questions, ask your nurse or doctor.        atorvastatin 10 MG  tablet Commonly known as: LIPITOR Take 10 mg by mouth at bedtime.   B-D 3CC LUER-LOK SYR 22GX1" 22G X 1" 3 ML Misc Generic drug: SYRINGE-NEEDLE (DISP) 3 ML See admin instructions.   cholecalciferol 1000 units tablet Commonly known as: VITAMIN D Take 1,000 Units by mouth daily.   colchicine 0.6 MG tablet Take 0.6-1.2 mg by mouth See admin instructions. Take 2 tablets at onset of gout attack then 1 tablet daily as needed for pain   fluticasone 50 MCG/ACT nasal spray Commonly known as: FLONASE Place 1 spray into the nose daily as needed for rhinitis or allergies.   hydrocortisone cream 1 % Apply 1 application topically daily as needed for itching.   ibuprofen 200 MG tablet Commonly known as: ADVIL Take 400 mg by mouth every 6 (six) hours as needed for moderate pain.   lisinopril-hydrochlorothiazide 20-25 MG tablet Commonly known as: ZESTORETIC TAKE 1 TABLET BY MOUTH EVERY DAY   methocarbamol 500 MG tablet Commonly known as: Robaxin Take 1 tablet (500 mg total) by mouth every 6 (six) hours as needed for muscle spasms.   metoprolol succinate 50 MG 24 hr tablet Commonly known as: TOPROL-XL TAKE 1 TABLET (50 MG TOTAL) BY MOUTH DAILY.   oxybutynin 5 MG tablet Commonly known as: DITROPAN Take 1 tablet (5 mg total) by mouth every 8 (eight) hours as needed for bladder spasms.   pantoprazole 40 MG tablet Commonly known as: PROTONIX Take 40 mg by mouth daily as needed (heartburn).   phenazopyridine 200 MG tablet Commonly  known as: Pyridium Take 1 tablet (200 mg total) by mouth 3 (three) times daily as needed for pain.   tamsulosin 0.4 MG Caps capsule Commonly known as: FLOMAX Take 0.4 mg by mouth at bedtime.   testosterone cypionate 200 MG/ML injection Commonly known as: DEPOTESTOSTERONE CYPIONATE Inject 100 mg into the muscle once a week.   TUMS E-X PO Take 2 tablets by mouth daily as needed (heartburn).       Allergies:  Allergies  Allergen Reactions  . Latex Itching    Redness. Has never had RAST testing. Bandaids/ dressings causes the itching and redness.  [place] Penicillins Itching and Rash    Fever. TOLERATED CEPHALEXIN. Has patient had a PCN reaction causing immediate rash, facial/tongue/throat swelling, SOB or lightheadedness with hypotension: No Has patient had a PCN reaction causing severe rash involving mucus membranes or skin necrosis: No Has patient had a PCN reaction that required hospitalization: No Has patient had a PCN reaction occurring within the last 10 years: No If all of the above answers are "NO", then may proceed with Cephalosporin use.   . Pneumococcal Vaccines Swelling, Rash and Other (See Comments)    Low grade fever. Puffy at sight of injection    Family History: Family History  Problem Relation Age of Onset  . Heart disease Father   . Hypertension Mother   . Stroke Mother     Social History:  reports that he has never smoked. He has never used smokeless tobacco. He reports that he does not drink alcohol and does not use drugs.   Physical Exam: BP 109/66   Pulse 65   Wt 160 lb (72.6 kg)   BMI 25.06 kg/m   Constitutional:  Alert and oriented, No acute distress. HEENT: [place] AT, moist mucus membranes.  Trachea midline, no masses. Cardiovascular: No clubbing, cyanosis, or edema. Respiratory: Normal respiratory effort, no increased work of breathing. Skin: No rashes, bruises or suspicious lesions. Neurologic: Grossly intact,  no focal deficits, moving all 4  extremities. Psychiatric: Normal mood and affect.  Laboratory Data:  Lab Results  Component Value Date   CREATININE 1.57 (H) 06/23/2020    Pertinent Imaging: Results for orders placed or performed in visit on 07/19/20  BLADDER SCAN AMB NON-IMAGING  Result Value Ref Range   Scan Result 0 ML       Assessment & Plan:    1. History of incomplete bladder emptying/urinary rentention S/p Urolift 07/04/2020.  Symptoms have markedly improved. Adequate emptying.  PVR is 0 mL. IPSS score: 5, mild Stop Flomax.    2. rUTI Likely related to #1.  3. History of elevated PSA NED Recent PSA was as of 06/10/2019  Follow up in 6 weeks for symptoms recheck.    [facility] [address] [place], [place] [address] [phone]  I, Selena Batten, am acting as a scribe for Dr. Hollice Espy.  I have reviewed the above documentation for accuracy and completeness, and I agree with the above.   Hollice Espy, MD

## 2020-07-19 ENCOUNTER — Other Ambulatory Visit: Payer: Self-pay

## 2020-07-19 ENCOUNTER — Ambulatory Visit (INDEPENDENT_AMBULATORY_CARE_PROVIDER_SITE_OTHER): Payer: Medicare Other | Admitting: Urology

## 2020-07-19 VITALS — BP 109/66 | HR 65 | Wt 160.0 lb

## 2020-07-19 DIAGNOSIS — N138 Other obstructive and reflux uropathy: Secondary | ICD-10-CM

## 2020-07-19 DIAGNOSIS — N401 Enlarged prostate with lower urinary tract symptoms: Secondary | ICD-10-CM | POA: Diagnosis not present

## 2020-07-19 DIAGNOSIS — R300 Dysuria: Secondary | ICD-10-CM

## 2020-07-19 LAB — BLADDER SCAN AMB NON-IMAGING: Scan Result: 0

## 2020-08-02 ENCOUNTER — Encounter: Payer: Self-pay | Admitting: Urology

## 2020-08-05 ENCOUNTER — Other Ambulatory Visit: Payer: Self-pay | Admitting: *Deleted

## 2020-08-05 MED ORDER — LISINOPRIL-HYDROCHLOROTHIAZIDE 10-12.5 MG PO TABS
1.0000 | ORAL_TABLET | Freq: Every day | ORAL | 3 refills | Status: DC
Start: 2020-08-05 — End: 2021-05-12

## 2020-08-09 ENCOUNTER — Ambulatory Visit: Payer: Medicare Other | Admitting: Urology

## 2020-09-05 NOTE — Progress Notes (Addendum)
09/06/2020 2:03 PM   Dale [name] [birth year] [id number]  Referring provider: [name], MD [place],  [place] [address] Chief Complaint  Patient presents with   Follow-up    HPI: Dale [name] is a 74 y.o. male who return for a follow up of rUTI, history of incomplete bladder emptying/urinaryretention and elevated PSA.   Patient underwent attempted HoLEP on 06/27/2020.Unable to advance necessary 26 Fr scope in order to accomplish HoLEP. Decision made to place council tip type catheter over wire, 24 French to allow urethra to heal and passive dilation(seeoperative note).   He returned to the operating room on 07/04/2020 for urolift. patient was discharged with a catheter. Patient removed catheter himself.   He saw Debroah Loop, PA-C on 07/12/2020. PVR was 103 mL. He had a good stream. Reported constipation. No sings of infection. He was advised to stop oxybutynin in light of struggling to empty.  During last visit, he noted a good stream. He had some burning and stinging with urination. He remained on Flomax. Overall, he was dramatically better than when he saw our PA a week ago. Emptying had improved, PVR 0.  He reported some discomfort near his rectum which he attributed to sitting a lot.  He noted that when he massages his levator muscle, his symptoms improved.  Bladder scan by PVR minimal . He reports being pleased with his urination. He reports passing an old clot or slothing tissue recently.   He is back on testosterone and his PSA will continued to be monitored while on this medication.   He report that he is working on his blood pressure. Denies being on Flomax.    IPSS    Row Name 09/06/20 1100         International Prostate Symptom Score   How often have you had the sensation of not emptying your bladder? Not at All     How often have you had to urinate less than every two hours? Less than 1 in 5 times     How  often have you found you stopped and started again several times when you urinated? Not at All     How often have you found it difficult to postpone urination? Less than 1 in 5 times     How often have you had a weak urinary stream? Less than 1 in 5 times     How often have you had to strain to start urination? Not at All     How many times did you typically get up at night to urinate? 1 Time     Total IPSS Score 4       Quality of Life due to urinary symptoms   If you were to spend the rest of your life with your urinary condition just the way it is now how would you feel about that? Pleased            Score:  1-7 Mild 8-19 Moderate 20-35 Severe  PMH: Past Medical History:  Diagnosis Date   Allergic rhinitis    Anxiety    BPH (benign prostatic hyperplasia)    DDD (degenerative disc disease), cervical    Depression    ED (erectile dysfunction)    Exertional angina [facility])    cardiologist-  [name]---  04-21-2013 abnormal myoview with epicardial coronaries ;  per cardiac cath 05-01-2013 no sig. obstructive CAD (LAD calcification, diffuse plaqueing) ;  evidence dRCA and LV branch vasoconstriction  GERD (gastroesophageal reflux disease)    Gout 06/2020   recent episode a few weeks ago   History of adenomatous polyp of colon    History of basal cell carcinoma (BCC) excision    History of blood transfusion    History of squamous cell carcinoma excision    09/ 27/ 2019  right lower leg    HTN (hypertension)    Hypercholesteremia    Hypogonadism in male    OA (osteoarthritis)    knees, lumbar, fingers   Seasonal allergies    Skin cancer, basal cell    Systolic murmur     Surgical History: Past Surgical History:  Procedure Laterality Date   APPENDECTOMY  age 37   CARDIAC CATHETERIZATION     no stent   CATARACT EXTRACTION W/ INTRAOCULAR LENS  IMPLANT, BILATERAL  07/2018   COLONOSCOPY N/A 03/02/2014   Procedure: COLONOSCOPY;  Surgeon: [name], MD;  Location: [facility];  Service: Endoscopy;  Laterality: N/A;   CYSTOSCOPY WITH INSERTION OF UROLIFT N/A 07/04/2020   Procedure: CYSTOSCOPY WITH INSERTION OF UROLIFT;  Surgeon: Hollice Espy, MD;  Location: [facility] OR[facility];  Service: Urology;  Laterality: N/A;   CYSTOSCOPY WITH URETHRAL DILATATION N/A 06/27/2020   Procedure: CYSTOSCOPY WITH URETHRAL DILATATION;  Surgeon: Hollice Espy, MD;  Location: [facility] ORS;  Service: Urology;  Laterality: N/A;   EYE SURGERY Bilateral    cataract    FINGER SURGERY     11-14-2004 right long finger;  12-19-2007 left index finger (both done by dr sypher)   HOT HEMOSTASIS N/A 03/02/2014   Procedure: HOT HEMOSTASIS (ARGON PLASMA COAGULATION/BICAP);  Surgeon: [name], MD;  Location: [facility];  Service: Endoscopy;  Laterality: N/A;   INGUINAL HERNIA REPAIR Bilateral left , early 2000s;  right 12/ 2003   inguinal hernia, with mesh   JOINT REPLACEMENT     KNEE ARTHROSCOPY Bilateral right- last one 1996;  left , last one ,yrs ago   [place]   open   LEFT HEART CATHETERIZATION WITH CORONARY ANGIOGRAM N/A 05/01/2013   Procedure: LEFT HEART CATHETERIZATION WITH CORONARY ANGIOGRAM;  Surgeon: [name], MD;  Location: [facility] CATH LAB;  Service: Cardiovascular;  Laterality: N/A;   MOHS SURGERY  yrs ago   nose   RECONSTRUCTION OF NOSE  1968   TOTAL KNEE ARTHROPLASTY Left 09/16/2018   Procedure: LEFT TOTAL KNEE ARTHROPLASTY;  Surgeon: [name], MD;  Location: [facility];  Service: Orthopedics;  Laterality: Left;  70 mins   TOTAL KNEE ARTHROPLASTY Right 04/28/2019   Procedure: TOTAL KNEE ARTHROPLASTY;  Surgeon: [name], MD;  Location: [facility];  Service: Orthopedics;  Laterality: Right;  70 mins    Home Medications:  Allergies as of 09/06/2020      Reactions   Latex Itching   Redness. Has never had RAST testing. Bandaids/ dressings causes the itching and redness.   Penicillins Itching, Rash   Fever.  TOLERATED CEPHALEXIN. Has patient had a PCN reaction causing immediate rash, facial/tongue/throat swelling, SOB or lightheadedness with hypotension: No Has patient had a PCN reaction causing severe rash involving mucus membranes or skin necrosis: No Has patient had a PCN reaction that required hospitalization: No Has patient had a PCN reaction occurring within the last 10 years: No If all of the above answers are "NO", then may proceed with Cephalosporin use.   Pneumococcal Vaccines Swelling, Rash, Other (See Comments)   Low grade fever. Puffy at sight of injection  Medication List       Accurate as of September 06, 2020 11:59 PM. If you have any questions, ask your nurse or doctor.        atorvastatin 10 MG tablet Commonly known as: LIPITOR Take 10 mg by mouth at bedtime.   B-D 3CC LUER-LOK SYR 22GX1" 22G X 1" 3 ML Misc Generic drug: SYRINGE-NEEDLE (DISP) 3 ML See admin instructions.   cholecalciferol 1000 units tablet Commonly known as: VITAMIN D Take 1,000 Units by mouth daily.   colchicine 0.6 MG tablet Take 0.6-1.2 mg by mouth See admin instructions. Take 2 tablets at onset of gout attack then 1 tablet daily as needed for pain   fluticasone 50 MCG/ACT nasal spray Commonly known as: FLONASE Place 1 spray into the nose daily as needed for rhinitis or allergies.   hydrocortisone cream 1 % Apply 1 application topically daily as needed for itching.   ibuprofen 200 MG tablet Commonly known as: ADVIL Take 400 mg by mouth every 6 (six) hours as needed for moderate pain.   lisinopril-hydrochlorothiazide 10-12.5 MG tablet Commonly known as: Zestoretic Take 1 tablet by mouth daily.   methocarbamol 500 MG tablet Commonly known as: Robaxin Take 1 tablet (500 mg total) by mouth every 6 (six) hours as needed for muscle spasms.   metoprolol succinate 50 MG 24 hr tablet Commonly known as: TOPROL-XL TAKE 1 TABLET (50 MG TOTAL) BY MOUTH DAILY.   oxybutynin 5 MG  tablet Commonly known as: DITROPAN Take 1 tablet (5 mg total) by mouth every 8 (eight) hours as needed for bladder spasms.   pantoprazole 40 MG tablet Commonly known as: PROTONIX Take 40 mg by mouth daily as needed (heartburn).   phenazopyridine 200 MG tablet Commonly known as: Pyridium Take 1 tablet (200 mg total) by mouth 3 (three) times daily as needed for pain.   tamsulosin 0.4 MG Caps capsule Commonly known as: FLOMAX Take 0.4 mg by mouth at bedtime.   testosterone cypionate 200 MG/ML injection Commonly known as: DEPOTESTOSTERONE CYPIONATE Inject 100 mg into the muscle once a week.   TUMS E-X PO Take 2 tablets by mouth daily as needed (heartburn).   Zoloft 50 MG tablet Generic drug: sertraline Take 50 mg by mouth daily.       Allergies:  Allergies  Allergen Reactions   Latex Itching    Redness. Has never had RAST testing. Bandaids/ dressings causes the itching and redness.   Penicillins Itching and Rash    Fever. TOLERATED CEPHALEXIN. Has patient had a PCN reaction causing immediate rash, facial/tongue/throat swelling, SOB or lightheadedness with hypotension: No Has patient had a PCN reaction causing severe rash involving mucus membranes or skin necrosis: No Has patient had a PCN reaction that required hospitalization: No Has patient had a PCN reaction occurring within the last 10 years: No If all of the above answers are "NO", then may proceed with Cephalosporin use.    Pneumococcal Vaccines Swelling, Rash and Other (See Comments)    Low grade fever. Puffy at sight of injection    Family History: Family History  Problem Relation Age of Onset   Heart disease Father    Hypertension Mother    Stroke Mother     Social History:  reports that he has never smoked. He has never used smokeless tobacco. He reports that he does not drink alcohol and does not use drugs.   Physical Exam: BP 112/67 (BP Location: Left Arm, Patient Position: Sitting, Cuff Size:  Normal)  Pulse (!) 58    Ht 5\' 7"  (1.702 m)    Wt 163 lb (73.9 kg)    BMI 25.53 kg/m   Constitutional:  Alert and oriented, No acute distress. HEENT: [place] AT, moist mucus membranes.  Trachea midline, no masses. Cardiovascular: No clubbing, cyanosis, or edema. Respiratory: Normal respiratory effort, no increased work of breathing. Skin: No rashes, bruises or suspicious lesions. Neurologic: Grossly intact, no focal deficits, moving all 4 extremities. Psychiatric: Normal mood and affect.  Laboratory Data:  Lab Results  Component Value Date   CREATININE 1.57 (H) 06/23/2020     Pertinent Imaging: Results for orders placed or performed in visit on 07/19/20  BLADDER SCAN AMB NON-IMAGING  Result Value Ref Range   Scan Result 0 ML      Assessment & Plan:    1. History of incomplete bladder emptying/urinary retention S/p Urolift 07/04/2020 IPSS score: 4, mild. PVR 0 mL.  Adequate emptying  Very pleased with outcome, no longer on BPH medications  2. rUTI Likely related to #1.  No recent issues  3. History of elevated PSA  NED Will be following up with Dr. Diona Fanti he will be monitoring his PSA especially now that he is getting back on testosterone  Follow-up as needed  [facility] [address], [place], [place] [address] [phone]  I, Selena Batten, am acting as a scribe for Dr. Hollice Espy.  I have reviewed the above documentation for accuracy and completeness, and I agree with the above.   Hollice Espy, MD

## 2020-09-06 ENCOUNTER — Encounter: Payer: Self-pay | Admitting: Urology

## 2020-09-06 ENCOUNTER — Other Ambulatory Visit: Payer: Self-pay

## 2020-09-06 ENCOUNTER — Ambulatory Visit (INDEPENDENT_AMBULATORY_CARE_PROVIDER_SITE_OTHER): Payer: Medicare Other | Admitting: Urology

## 2020-09-06 VITALS — BP 112/67 | HR 58 | Ht 67.0 in | Wt 163.0 lb

## 2020-09-06 DIAGNOSIS — N138 Other obstructive and reflux uropathy: Secondary | ICD-10-CM | POA: Diagnosis not present

## 2020-09-06 DIAGNOSIS — R300 Dysuria: Secondary | ICD-10-CM | POA: Diagnosis not present

## 2020-09-06 DIAGNOSIS — N401 Enlarged prostate with lower urinary tract symptoms: Secondary | ICD-10-CM

## 2020-10-04 NOTE — Progress Notes (Signed)
Cardiology Office Note:    Date:  10/05/2020   ID:  Dale [name], DOB [birth year], MRN [id number]  PCP:  [name], MD  Cardiologist:  [name], MD   Referring MD: [name], MD   Chief Complaint  Patient presents with  . Coronary Artery Disease  . Hypertension  . Hyperlipidemia    History of Present Illness:    Dale [name] is a 74 y.o. male with a hx of angina pectoris with abnormal nuclear study with inferolateral and apical ischemia, nonobstructive  epicardial coronaries, systolic murmur,  hypertension, hyperlipidemia, and resting bradycardia.  Earlier this year, orthostatic dizziness was occurring.  Turned out that the lisinopril HCTZ was influencing blood pressure and causing significant orthostasis.Marland Kitchen  He started taking 1/2 tablet which is equivalent to 10/12.5 mg daily and is felt much better.  Today his blood pressure is low normal on therapy.  There have been no significant changes in other medical therapy.  No additions.  Diet and weight have been stable.  He has not had any significant episodes of chest discomfort.  Plays pickle ball multiple days per week.  Endurance has been stable.  Past Medical History:  Diagnosis Date  . Allergic rhinitis   . Anxiety   . BPH (benign prostatic hyperplasia)   . DDD (degenerative disc disease), cervical   . Depression   . ED (erectile dysfunction)   . Exertional angina [facility])    cardiologist-  [name]---  04-21-2013 abnormal myoview with epicardial coronaries ;  per cardiac cath 05-01-2013 no sig. obstructive CAD (LAD calcification, diffuse plaqueing) ;  evidence dRCA and LV branch vasoconstriction  . GERD (gastroesophageal reflux disease)   . Gout 06/2020   recent episode a few weeks ago  . History of adenomatous polyp of colon   . History of basal cell carcinoma (BCC) excision   . History of blood transfusion   . History of squamous cell carcinoma excision    09/ 27/ 2019  right  lower leg   . HTN (hypertension)   . Hypercholesteremia   . Hypogonadism in male   . OA (osteoarthritis)    knees, lumbar, fingers  . Seasonal allergies   . Skin cancer, basal cell   . Systolic murmur     Past Surgical History:  Procedure Laterality Date  . APPENDECTOMY  age 22  . CARDIAC CATHETERIZATION     no stent  . CATARACT EXTRACTION W/ INTRAOCULAR LENS  IMPLANT, BILATERAL  07/2018  . COLONOSCOPY N/A 03/02/2014   Procedure: COLONOSCOPY;  Surgeon: [name], MD;  Location: [facility] ENDOSCOPY;  Service: Endoscopy;  Laterality: N/A;  . CYSTOSCOPY WITH INSERTION OF UROLIFT N/A 07/04/2020   Procedure: CYSTOSCOPY WITH INSERTION OF UROLIFT;  Surgeon: Hollice Espy, MD;  Location: [facility] ORS;  Service: Urology;  Laterality: N/A;  . CYSTOSCOPY WITH URETHRAL DILATATION N/A 06/27/2020   Procedure: CYSTOSCOPY WITH URETHRAL DILATATION;  Surgeon: Hollice Espy, MD;  Location: [facility] ORS;  Service: Urology;  Laterality: N/A;  . EYE SURGERY Bilateral    cataract   . FINGER SURGERY     11-14-2004 right long finger;  12-19-2007 left index finger (both done by dr sypher)  . HOT HEMOSTASIS N/A 03/02/2014   Procedure: HOT HEMOSTASIS (ARGON PLASMA COAGULATION/BICAP);  Surgeon: [name], MD;  Location: [facility];  Service: Endoscopy;  Laterality: N/A;  . INGUINAL HERNIA REPAIR Bilateral left , early 2000s;  right 12/ 2003   inguinal hernia, with mesh  .  JOINT REPLACEMENT    . KNEE ARTHROSCOPY Bilateral right- last one 1996;  left , last one ,yrs ago  . KNEE SURGERY Left 1972   open  . LEFT HEART CATHETERIZATION WITH CORONARY ANGIOGRAM N/A 05/01/2013   Procedure: LEFT HEART CATHETERIZATION WITH CORONARY ANGIOGRAM;  Surgeon: [name], MD;  Location: [facility] CATH LAB;  Service: Cardiovascular;  Laterality: N/A;  . MOHS SURGERY  yrs ago   nose  . RECONSTRUCTION OF NOSE  1968  . TOTAL KNEE ARTHROPLASTY Left 09/16/2018   Procedure: LEFT TOTAL KNEE ARTHROPLASTY;  Surgeon: [name], MD;  Location: [facility] ORS;  Service: Orthopedics;  Laterality: Left;  70 mins  . TOTAL KNEE ARTHROPLASTY Right 04/28/2019   Procedure: TOTAL KNEE ARTHROPLASTY;  Surgeon: [name], MD;  Location: [facility] ORS;  Service: Orthopedics;  Laterality: Right;  70 mins    Current Medications: Current Meds  Medication Sig  . atorvastatin (LIPITOR) 10 MG tablet Take 10 mg by mouth at bedtime.  . B-D 3CC LUER-LOK SYR 22GX1" 22G X 1" 3 ML MISC See admin instructions.  . Calcium Carbonate Antacid (TUMS E-X PO) Take 2 tablets by mouth daily as needed (heartburn).   . cholecalciferol (VITAMIN D) 1000 units tablet Take 1,000 Units by mouth daily.   . colchicine 0.6 MG tablet Take 0.6-1.2 mg by mouth See admin instructions. Take 2 tablets at onset of gout attack then 1 tablet daily as needed for pain  . fluticasone (FLONASE) 50 MCG/ACT nasal spray Place 1 spray into the nose daily as needed for rhinitis or allergies.  . hydrocortisone cream 1 % Apply 1 application topically daily as needed for itching.   Marland Kitchen ibuprofen (ADVIL) 200 MG tablet Take 400 mg by mouth every 6 (six) hours as needed for moderate pain.  Marland Kitchen lisinopril-hydrochlorothiazide (ZESTORETIC) 10-12.5 MG tablet Take 1 tablet by mouth daily.  . metaxalone (SKELAXIN) 800 MG tablet Take 1 tablet by mouth as needed.  . methocarbamol (ROBAXIN) 500 MG tablet Take 1 tablet (500 mg total) by mouth every 6 (six) hours as needed for muscle spasms.  . metoprolol succinate (TOPROL-XL) 50 MG 24 hr tablet TAKE 1 TABLET (50 MG TOTAL) BY MOUTH DAILY.  . pantoprazole (PROTONIX) 40 MG tablet Take 40 mg by mouth daily as needed (heartburn).   Marland Kitchen testosterone cypionate (DEPOTESTOSTERONE CYPIONATE) 200 MG/ML injection Inject 100 mg into the muscle once a week.      Allergies:   Latex, Penicillins, and Pneumococcal vaccines   Social History   Socioeconomic History  . Marital status: Married    Spouse name: Joy  . Number of children: 2  . Years of education: Not  on file  . Highest education level: Not on file  Occupational History  . Occupation: Teacher, music    Comment: retired  Tobacco Use  . Smoking status: Never Smoker  . Smokeless tobacco: Never Used  Vaping Use  . Vaping Use: Never used  Substance and Sexual Activity  . Alcohol use: No  . Drug use: Never  . Sexual activity: Not on file  Other Topics Concern  . Not on file  Social History Narrative   Patient lives with wife.    Social Determinants of Health   Financial Resource Strain:   . Difficulty of Paying Living Expenses: Not on file  Food Insecurity:   . Worried About Charity fundraiser in the Last Year: Not on file  . Ran Out of Food in the Last Year: Not  on file  Transportation Needs:   . Lack of Transportation (Medical): Not on file  . Lack of Transportation (Non-Medical): Not on file  Physical Activity:   . Days of Exercise per Week: Not on file  . Minutes of Exercise per Session: Not on file  Stress:   . Feeling of Stress : Not on file  Social Connections:   . Frequency of Communication with Friends and Family: Not on file  . Frequency of Social Gatherings with Friends and Family: Not on file  . Attends Religious Services: Not on file  . Active Member of Clubs or Organizations: Not on file  . Attends Archivist Meetings: Not on file  . Marital Status: Not on file     Family History: The patient's family history includes Heart disease in his father; Hypertension in his mother; Stroke in his mother.  ROS:   Please see the history of present illness.    Prior to a urological procedure, was noted on EKG to have a heart rate of 44 bpm.  He was asymptomatic.  Enjoying his 8 grandchildren.  All are boys except one girl who is 57.  No actions were taken.  All other systems reviewed and are negative.  EKGs/Labs/Other Studies Reviewed:    The following studies were reviewed today: No new data  EKG:  EKG from 07/05/2020 with sinus bradycardia,  normal PR interval; IVCD, and no change c/w 06/23/20  Recent Labs: 06/23/2020: BUN 33; Creatinine, Ser 1.57; Hemoglobin 14.6; Platelets 191; Potassium 4.1; Sodium 138  Recent Lipid Panel No results found for: CHOL, TRIG, HDL, CHOLHDL, VLDL, LDLCALC, LDLDIRECT  Physical Exam:    VS:  BP 104/64   Pulse 60   Ht 5\' 7"  (1.702 m)   Wt 167 lb (75.8 kg)   SpO2 99%   BMI 26.16 kg/m     Wt Readings from Last 3 Encounters:  10/05/20 167 lb (75.8 kg)  09/06/20 163 lb (73.9 kg)  07/19/20 160 lb (72.6 kg)     GEN: Compatible with age. No acute distress HEENT: Normal NECK: No JVD. LYMPHATICS: No lymphadenopathy CARDIAC: 1/6 systolic murmur with RRR without diastolic murmur, gallop, or edema. VASCULAR:  Normal Pulses. No bruits. RESPIRATORY:  Clear to auscultation without rales, wheezing or rhonchi  ABDOMEN: Soft, non-tender, non-distended, No pulsatile mass, MUSCULOSKELETAL: No deformity  SKIN: Warm and dry NEUROLOGIC:  Alert and oriented x 3 PSYCHIATRIC:  Normal affect   ASSESSMENT:    1. Exertional angina (HCC)   2. Essential hypertension   3. Hypercholesteremia   4. Systolic murmur   5. Educated about COVID-19 virus infection    PLAN:    In order of problems listed above:  1. Not experiencing angina.  Angiography did not reveal significant obstructive disease.  If angina existed, it would be related to microvascular dysfunction. 2. Low normal blood pressure.  Encourage the patient to drink fluids liberally.  Also he does not need to specifically exclude salt although he has never really been a person that enjoyed salty cuisine.  May be that combination lisinopril and HCTZ can be broken up getting rid of one of the components if orthostasis recurs.  Another option would be to decrease the dose of Toprol-XL to 25 mg/day.  No changes offered at this time since he seems to be doing well. 3. Continue low-dose Lipitor 10 mg/day.  LDL was at 82 in July. 4. No change in quality.  No  further work-up at this time. 5. He  is vaccinated and practicing medication.   Medication Adjustments/Labs and Tests Ordered: Current medicines are reviewed at length with the patient today.  Concerns regarding medicines are outlined above.  No orders of the defined types were placed in this encounter.  No orders of the defined types were placed in this encounter.   Patient Instructions  Medication Instructions:  Your physician recommends that you continue on your current medications as directed. Please refer to the Current Medication list given to you today.  *If you need a refill on your cardiac medications before your next appointment, please call your pharmacy*   Lab Work: None If you have labs (blood work) drawn today and your tests are completely normal, you will receive your results only by: Marland Kitchen MyChart Message (if you have MyChart) OR . A paper copy in the mail If you have any lab test that is abnormal or we need to change your treatment, we will call you to review the results.   Testing/Procedures: None   Follow-Up: At [facility], you and your health needs are our priority.  As part of our continuing mission to provide you with exceptional heart care, we have created designated Provider Care Teams.  These Care Teams include your primary Cardiologist (physician) and Advanced Practice Providers (APPs -  Physician Assistants and Nurse Practitioners) who all work together to provide you with the care you need, when you need it.  We recommend signing up for the patient portal called "MyChart".  Sign up information is provided on this After Visit Summary.  MyChart is used to connect with patients for Virtual Visits (Telemedicine).  Patients are able to view lab/test results, encounter notes, upcoming appointments, etc.  Non-urgent messages can be sent to your provider as well.   To learn more about what you can do with MyChart, go to [email or web address].    Your next  appointment:   12 month(s)  The format for your next appointment:   In Person  Provider:   You may see [name], MD or one of the following Advanced Practice Providers on your designated Care Team:    Truitt Merle, NP  Cecilie Kicks, NP  Kathyrn Drown, NP    Other Instructions      Signed, [name], MD  10/05/2020 5:10 PM    [place]

## 2020-10-05 ENCOUNTER — Other Ambulatory Visit: Payer: Self-pay

## 2020-10-05 ENCOUNTER — Encounter: Payer: Self-pay | Admitting: Interventional Cardiology

## 2020-10-05 ENCOUNTER — Ambulatory Visit (INDEPENDENT_AMBULATORY_CARE_PROVIDER_SITE_OTHER): Payer: Medicare Other | Admitting: Interventional Cardiology

## 2020-10-05 VITALS — BP 104/64 | HR 60 | Ht 67.0 in | Wt 167.0 lb

## 2020-10-05 DIAGNOSIS — E7800 Pure hypercholesterolemia, unspecified: Secondary | ICD-10-CM

## 2020-10-05 DIAGNOSIS — Z7189 Other specified counseling: Secondary | ICD-10-CM

## 2020-10-05 DIAGNOSIS — I208 Other forms of angina pectoris: Secondary | ICD-10-CM | POA: Diagnosis not present

## 2020-10-05 DIAGNOSIS — I10 Essential (primary) hypertension: Secondary | ICD-10-CM | POA: Diagnosis not present

## 2020-10-05 DIAGNOSIS — I2089 Other forms of angina pectoris: Secondary | ICD-10-CM

## 2020-10-05 DIAGNOSIS — R011 Cardiac murmur, unspecified: Secondary | ICD-10-CM | POA: Diagnosis not present

## 2020-10-05 NOTE — Patient Instructions (Signed)

## 2020-11-01 ENCOUNTER — Ambulatory Visit: Payer: Medicare Other | Admitting: Interventional Cardiology

## 2021-05-12 ENCOUNTER — Telehealth: Payer: Self-pay | Admitting: Interventional Cardiology

## 2021-05-12 ENCOUNTER — Ambulatory Visit (INDEPENDENT_AMBULATORY_CARE_PROVIDER_SITE_OTHER): Payer: Medicare Other

## 2021-05-12 ENCOUNTER — Other Ambulatory Visit: Payer: Self-pay

## 2021-05-12 ENCOUNTER — Encounter: Payer: Self-pay | Admitting: Interventional Cardiology

## 2021-05-12 ENCOUNTER — Ambulatory Visit (INDEPENDENT_AMBULATORY_CARE_PROVIDER_SITE_OTHER): Payer: Medicare Other | Admitting: Interventional Cardiology

## 2021-05-12 VITALS — BP 100/70 | HR 55 | Ht 67.0 in | Wt 155.0 lb

## 2021-05-12 DIAGNOSIS — R55 Syncope and collapse: Secondary | ICD-10-CM

## 2021-05-12 DIAGNOSIS — R011 Cardiac murmur, unspecified: Secondary | ICD-10-CM

## 2021-05-12 DIAGNOSIS — E7800 Pure hypercholesterolemia, unspecified: Secondary | ICD-10-CM | POA: Diagnosis not present

## 2021-05-12 DIAGNOSIS — I10 Essential (primary) hypertension: Secondary | ICD-10-CM | POA: Diagnosis not present

## 2021-05-12 NOTE — Progress Notes (Unsigned)
Enrolled patient for a 14 day Zio XT  monitor to be mailed to patients home  °

## 2021-05-12 NOTE — Telephone Encounter (Signed)
STAT if HR is under 50 or over 120 (normal HR is 60-100 beats per minute)  What is your heart rate? 42-55 at rest,   Do you have a log of your heart rate readings (document readings)?   Do you have any other symptoms? Dizziness, lightheadedness, weakness Pt played Pickle ball Wednesday  but started feeling bad yesterday. BP is currently168/87 HR 54

## 2021-05-12 NOTE — Patient Instructions (Signed)
Medication Instructions:  1) DISCONTINUE Lisinopril/HCTZ  *If you need a refill on your cardiac medications before your next appointment, please call your pharmacy*   Lab Work: BMET and CBC today  If you have labs (blood work) drawn today and your tests are completely normal, you will receive your results only by: [place] (if you have MyChart) OR A paper copy in the mail If you have any lab test that is abnormal or we need to change your treatment, we will call you to review the results.   Testing/Procedures: Your physician recommends that you wear a monitor for 2 weeks.   Follow-Up: At [facility], you and your health needs are our priority.  As part of our continuing mission to provide you with exceptional heart care, we have created designated Provider Care Teams.  These Care Teams include your primary Cardiologist (physician) and Advanced Practice Providers (APPs -  Physician Assistants and Nurse Practitioners) who all work together to provide you with the care you need, when you need it.  We recommend signing up for the patient portal called "MyChart".  Sign up information is provided on this After Visit Summary.  MyChart is used to connect with patients for Virtual Visits (Telemedicine).  Patients are able to view lab/test results, encounter notes, upcoming appointments, etc.  Non-urgent messages can be sent to your provider as well.   To learn more about what you can do with MyChart, go to [email or web address].    Your next appointment:   4 month(s)  The format for your next appointment:   In Person  Provider:   You may see [name], MD or one of the following Advanced Practice Providers on your designated Care Team:   Kathyrn Drown, NP   Other Instructions  [place] Monitor Instructions  Your physician has requested you wear a ZIO patch monitor for 14 days.  This is a single patch monitor. [employer] supplies one patch monitor per  enrollment. Additional stickers are not available. Please do not apply patch if you will be having a Nuclear Stress Test,  Echocardiogram, Cardiac CT, MRI, or Chest Xray during the period you would be wearing the  monitor. The patch cannot be worn during these tests. You cannot remove and re-apply the  ZIO XT patch monitor.  Your ZIO patch monitor will be mailed 3 day USPS to your address on file. It may take 3-5 days  to receive your monitor after you have been enrolled.  Once you have received your monitor, please review the enclosed instructions. Your monitor  has already been registered assigning a specific monitor serial # to you.  Billing and Patient Assistance Program Information  We have supplied [employer] with any of your insurance information on file for billing purposes. [employer] offers a sliding scale Patient Assistance Program for patients that do not have  insurance, or whose insurance does not completely cover the cost of the ZIO monitor.  You must apply for the Patient Assistance Program to qualify for this discounted rate.  To apply, please call [employer] at [phone], select option 4, select option 2, ask to apply for  Patient Assistance Program. Theodore Demark will ask your household income, and how many people  are in your household. They will quote your out-of-pocket cost based on that information.  [employer] will also be able to set up a 54-month, interest-free payment plan if needed.  Applying the monitor   Shave hair from upper left chest.  Hold abrader  disc by orange tab. Rub abrader in 40 strokes over the upper left chest as  indicated in your monitor instructions.  Clean area with 4 enclosed alcohol pads. Let dry.  Apply patch as indicated in monitor instructions. Patch will be placed under collarbone on left  side of chest with arrow pointing upward.  Rub patch adhesive wings for 2 minutes. Remove white label marked "1". Remove the white  label marked "2". Rub patch  adhesive wings for 2 additional minutes.  While looking in a mirror, press and release button in center of patch. A small green light will  flash 3-4 times. This will be your only indicator that the monitor has been turned on.  Do not shower for the first 24 hours. You may shower after the first 24 hours.  Press the button if you feel a symptom. You will hear a small click. Record Date, Time and  Symptom in the Patient Logbook.  When you are ready to remove the patch, follow instructions on the last 2 pages of Patient  Logbook. Stick patch monitor onto the last page of Patient Logbook.  Place Patient Logbook in the blue and white box. Use locking tab on box and tape box closed  securely. The blue and white box has prepaid postage on it. Please place it in the mailbox as  soon as possible. Your physician should have your test results approximately 7 days after the  monitor has been mailed back to [facility].  Call [facility] at [phone] if you have questions regarding  your ZIO XT patch monitor. Call them immediately if you see an orange light blinking on your  monitor.  If your monitor falls off in less than 4 days, contact our Monitor department at [phone].  If your monitor becomes loose or falls off after 4 days call [employer] at [phone] for  suggestions on securing your monitor

## 2021-05-12 NOTE — Progress Notes (Signed)
Cardiology Office Note:    Date:  05/12/2021   ID:  Dale [name], DOB 10-21-1946, MRN [id number]  PCP:  [name], MD  Cardiologist:  [name], MD   Referring MD: [name], MD   Chief Complaint  Patient presents with   Loss of Consciousness   Hypertension     History of Present Illness:    Dale [name] is a 75 y.o. male with a hx of angina pectoris with abnormal nuclear study with inferolateral and apical ischemia, nonobstructive  epicardial coronaries, systolic murmur,  hypertension, hyperlipidemia, and resting bradycardia.   Patient known to have bradycardia at rest at times with heart rates less than 50, asymptomatic.  On Wednesday evening he had syncope after getting up from a supine position with his legs elevated on a chair.  He had been in that position for quite some time.  He got up quickly to respond to a request from his wife.  After taking several steps he began feeling lightheaded and dizzy.  He then fell, there was significant impact on his buttocks.  He did not hit his head.  His wife responded to hearing him fall and he was awake when she got there.  He had no preceding palpitations, dyspnea, chest pain, or other cardiac complaints.  Over the past 2 months he has lost 18 pounds by design.  He began noticing 2 weeks ago that his blood pressures were running relatively low, at times with a systolic less than 812 mmHg.  Because of this, he dependently decrease his antihypertensive regimen by stopping lisinopril HCTZ.  He did this for nearly a week.  On the morning of the episode of fainting, he resumed the lisinopril HCTZ for the first time in several days.  He is continue to take metoprolol succinate 50 mg/day.  He has not taken the lisinopril HCTZ on Thursday or today.  Pressure this morning at home was 160/80 lying flat in bed and 128/70 sitting relaxed in his den.  Past Medical History:  Diagnosis Date   Allergic rhinitis     Anxiety    BPH (benign prostatic hyperplasia)    DDD (degenerative disc disease), cervical    Depression    ED (erectile dysfunction)    Exertional angina [facility])    cardiologist-  [name]---  04-21-2013 abnormal myoview with epicardial coronaries ;  per cardiac cath 05-01-2013 no sig. obstructive CAD (LAD calcification, diffuse plaqueing) ;  evidence dRCA and LV branch vasoconstriction   GERD (gastroesophageal reflux disease)    Gout 06/2020   recent episode a few weeks ago   History of adenomatous polyp of colon    History of basal cell carcinoma (BCC) excision    History of blood transfusion    History of squamous cell carcinoma excision    09/ 27/ 2019  right lower leg    HTN (hypertension)    Hypercholesteremia    Hypogonadism in male    OA (osteoarthritis)    knees, lumbar, fingers   Seasonal allergies    Skin cancer, basal cell    Systolic murmur     Past Surgical History:  Procedure Laterality Date   APPENDECTOMY  age 70   CARDIAC CATHETERIZATION     no stent   CATARACT EXTRACTION W/ INTRAOCULAR LENS  IMPLANT, BILATERAL  07/2018   COLONOSCOPY N/A 03/02/2014   Procedure: COLONOSCOPY;  Surgeon: [name], MD;  Location: [facility];  Service: Endoscopy;  Laterality: N/A;  CYSTOSCOPY WITH INSERTION OF UROLIFT N/A 07/04/2020   Procedure: CYSTOSCOPY WITH INSERTION OF UROLIFT;  Surgeon: Hollice Espy, MD;  Location: [facility] ORS;  Service: Urology;  Laterality: N/A;   CYSTOSCOPY WITH URETHRAL DILATATION N/A 06/27/2020   Procedure: CYSTOSCOPY WITH URETHRAL DILATATION;  Surgeon: Hollice Espy, MD;  Location: [facility] ORS;  Service: Urology;  Laterality: N/A;   EYE SURGERY Bilateral    cataract    FINGER SURGERY     11-14-2004 right long finger;  12-19-2007 left index finger (both done by dr sypher)   HOT HEMOSTASIS N/A 03/02/2014   Procedure: HOT HEMOSTASIS (ARGON PLASMA COAGULATION/BICAP);  Surgeon: [name], MD;  Location: [facility];  Service:  Endoscopy;  Laterality: N/A;   INGUINAL HERNIA REPAIR Bilateral left , early 2000s;  right 12/ 2003   inguinal hernia, with mesh   JOINT REPLACEMENT     KNEE ARTHROSCOPY Bilateral right- last one 1996;  left , last one ,yrs ago   [place]   open   LEFT HEART CATHETERIZATION WITH CORONARY ANGIOGRAM N/A 05/01/2013   Procedure: LEFT HEART CATHETERIZATION WITH CORONARY ANGIOGRAM;  Surgeon: [name], MD;  Location: [facility] CATH LAB;  Service: Cardiovascular;  Laterality: N/A;   MOHS SURGERY  yrs ago   nose   RECONSTRUCTION OF NOSE  1968   TOTAL KNEE ARTHROPLASTY Left 09/16/2018   Procedure: LEFT TOTAL KNEE ARTHROPLASTY;  Surgeon: [name], MD;  Location: [facility];  Service: Orthopedics;  Laterality: Left;  70 mins   TOTAL KNEE ARTHROPLASTY Right 04/28/2019   Procedure: TOTAL KNEE ARTHROPLASTY;  Surgeon: [name], MD;  Location: [facility];  Service: Orthopedics;  Laterality: Right;  70 mins    Current Medications: Current Meds  Medication Sig   B-D 3CC LUER-LOK SYR 22GX1" 22G X 1" 3 ML MISC See admin instructions.   Calcium Carbonate Antacid (TUMS E-X PO) Take 2 tablets by mouth daily as needed (heartburn).    cholecalciferol (VITAMIN D) 1000 units tablet Take 1,000 Units by mouth daily.    colchicine 0.6 MG tablet Take 0.6-1.2 mg by mouth See admin instructions. Take 2 tablets at onset of gout attack then 1 tablet daily as needed for pain   fluticasone (FLONASE) 50 MCG/ACT nasal spray Place 1 spray into the nose daily as needed for rhinitis or allergies.   hydrocortisone cream 1 % Apply 1 application topically daily as needed for itching.    ibuprofen (ADVIL) 200 MG tablet Take 400 mg by mouth every 6 (six) hours as needed for moderate pain.   metaxalone (SKELAXIN) 800 MG tablet Take 1 tablet by mouth as needed.   methocarbamol (ROBAXIN) 500 MG tablet Take 1 tablet (500 mg total) by mouth every 6 (six) hours as needed for muscle spasms.   metoprolol succinate (TOPROL-XL)  50 MG 24 hr tablet TAKE 1 TABLET (50 MG TOTAL) BY MOUTH DAILY.   pantoprazole (PROTONIX) 40 MG tablet Take 40 mg by mouth daily as needed (heartburn).    testosterone cypionate (DEPOTESTOSTERONE CYPIONATE) 200 MG/ML injection Inject 100 mg into the muscle once a week.    [DISCONTINUED] atorvastatin (LIPITOR) 10 MG tablet Take 10 mg by mouth at bedtime.   [DISCONTINUED] lisinopril-hydrochlorothiazide (ZESTORETIC) 10-12.5 MG tablet Take 1 tablet by mouth daily.     Allergies:   Latex, Penicillins, and Pneumococcal vaccines   Social History   Socioeconomic History   Marital status: Married    Spouse name: Joy   Number of children: 2  Years of education: Not on file   Highest education level: Not on file  Occupational History   Occupation: senior Sports administrator    Comment: retired  Tobacco Use   Smoking status: Never   Smokeless tobacco: Never  Vaping Use   Vaping Use: Never used  Substance and Sexual Activity   Alcohol use: No   Drug use: Never   Sexual activity: Not on file  Other Topics Concern   Not on file  Social History Narrative   Patient lives with wife.    Social Determinants of Health   Financial Resource Strain: Not on file  Food Insecurity: Not on file  Transportation Needs: Not on file  Physical Activity: Not on file  Stress: Not on file  Social Connections: Not on file     Family History: The patient's family history includes Heart disease in his father; Hypertension in his mother; Stroke in his mother.  ROS:   Please see the history of present illness.    No nausea, vomiting, or diarrhea.  All other systems reviewed and are negative.  EKGs/Labs/Other Studies Reviewed:    The following studies were reviewed today: No new imaging.  EKG:  EKG sinus bradycardia at 55 bpm.  Otherwise normal.  Recent Labs: 06/23/2020: BUN 33; Creatinine, Ser 1.57; Hemoglobin 14.6; Platelets 191; Potassium 4.1; Sodium 138  Recent Lipid Panel No results found for:  CHOL, TRIG, HDL, CHOLHDL, VLDL, LDLCALC, LDLDIRECT  Physical Exam:    VS:  BP 100/70   Pulse (!) 55   Ht 5\' 7"  (1.702 m)   Wt 155 lb (70.3 kg)   SpO2 97%   BMI 24.28 kg/m     Wt Readings from Last 3 Encounters:  05/12/21 155 lb (70.3 kg)  10/05/20 167 lb (75.8 kg)  09/06/20 163 lb (73.9 kg)    Orthostatic blood pressure: Lying 150/80 mmHg with heart rate 55; sitting 140/82 mmHg heart rate 64; standing 125/70 mmHg heart rate 70  GEN: Healthy-appearing without pallor. No acute distress HEENT: Normal NECK: No JVD. LYMPHATICS: No lymphadenopathy CARDIAC: No murmur. RRR no gallop, or edema. VASCULAR:  Normal Pulses. No bruits. RESPIRATORY:  Clear to auscultation without rales, wheezing or rhonchi  ABDOMEN: Soft, non-tender, non-distended, No pulsatile mass, MUSCULOSKELETAL: No deformity  SKIN: Warm and dry NEUROLOGIC:  Alert and oriented x 3 PSYCHIATRIC:  Normal affect   ASSESSMENT:    1. Syncope and collapse   2. Essential hypertension   3. Hypercholesteremia   4. Systolic murmur    PLAN:    In order of problems listed above:  Almost certainly related to orthostasis which persists even today.  Components of this episode include recent significant weight loss which lowers blood pressure, and reinstitution of lisinopril HCTZ on the day of the episode.  The mechanism was further potentiated by lying for greater than 10 minutes with the legs elevated on a chair while stretching his back.  He went directly from this position to standing and walking and then fainted.  Because he has occasional bradycardia we will do a 30-day monitor to rule out AV block/bradycardia as a potential mechanism. Still orthostatic.  Instructed the patient to not resume lisinopril HCTZ as significant weight loss is now allowing metoprolol as monotherapy to control his blood pressure.  Monitoring will help demonstrate whether or not beta-blocker therapy is safe going forward. He is not currently on statin  therapy.  Most recent LDL was 82 in 2021 slightly less than a year ago. No murmur  observed today.  We will perform a 30-day monitor.  Stop lisinopril HCTZ, cautioned against rapidly arising from a supine position until he is sure that dizziness is not going to occur.  Basic metabolic panel, CBC today.   Medication Adjustments/Labs and Tests Ordered: Current medicines are reviewed at length with the patient today.  Concerns regarding medicines are outlined above.  Orders Placed This Encounter  Procedures   Basic metabolic panel   CBC   LONG TERM MONITOR (3-14 DAYS)   EKG 12-Lead   No orders of the defined types were placed in this encounter.   Patient Instructions  Medication Instructions:  1) DISCONTINUE Lisinopril/HCTZ  *If you need a refill on your cardiac medications before your next appointment, please call your pharmacy*   Lab Work: BMET and CBC today  If you have labs (blood work) drawn today and your tests are completely normal, you will receive your results only by: [place] (if you have MyChart) OR A paper copy in the mail If you have any lab test that is abnormal or we need to change your treatment, we will call you to review the results.   Testing/Procedures: Your physician recommends that you wear a monitor for 2 weeks.   Follow-Up: At [facility], you and your health needs are our priority.  As part of our continuing mission to provide you with exceptional heart care, we have created designated Provider Care Teams.  These Care Teams include your primary Cardiologist (physician) and Advanced Practice Providers (APPs -  Physician Assistants and Nurse Practitioners) who all work together to provide you with the care you need, when you need it.  We recommend signing up for the patient portal called "MyChart".  Sign up information is provided on this After Visit Summary.  MyChart is used to connect with patients for Virtual Visits (Telemedicine).  Patients  are able to view lab/test results, encounter notes, upcoming appointments, etc.  Non-urgent messages can be sent to your provider as well.   To learn more about what you can do with MyChart, go to [email or web address].    Your next appointment:   4 month(s)  The format for your next appointment:   In Person  Provider:   You may see [name], MD or one of the following Advanced Practice Providers on your designated Care Team:   Kathyrn Drown, NP   Other Instructions  [place] Monitor Instructions  Your physician has requested you wear a ZIO patch monitor for 14 days.  This is a single patch monitor. [employer] supplies one patch monitor per enrollment. Additional stickers are not available. Please do not apply patch if you will be having a Nuclear Stress Test,  Echocardiogram, Cardiac CT, MRI, or Chest Xray during the period you would be wearing the  monitor. The patch cannot be worn during these tests. You cannot remove and re-apply the  ZIO XT patch monitor.  Your ZIO patch monitor will be mailed 3 day USPS to your address on file. It may take 3-5 days  to receive your monitor after you have been enrolled.  Once you have received your monitor, please review the enclosed instructions. Your monitor  has already been registered assigning a specific monitor serial # to you.  Billing and Patient Assistance Program Information  We have supplied [employer] with any of your insurance information on file for billing purposes. [employer] offers a sliding scale Patient Assistance Program for patients that do not have  insurance,  or whose insurance does not completely cover the cost of the ZIO monitor.  You must apply for the Patient Assistance Program to qualify for this discounted rate.  To apply, please call [employer] at [phone], select option 4, select option 2, ask to apply for  Patient Assistance Program. Theodore Demark will ask your household income, and how many people  are  in your household. They will quote your out-of-pocket cost based on that information.  [employer] will also be able to set up a 31-month, interest-free payment plan if needed.  Applying the monitor   Shave hair from upper left chest.  Hold abrader disc by orange tab. Rub abrader in 40 strokes over the upper left chest as  indicated in your monitor instructions.  Clean area with 4 enclosed alcohol pads. Let dry.  Apply patch as indicated in monitor instructions. Patch will be placed under collarbone on left  side of chest with arrow pointing upward.  Rub patch adhesive wings for 2 minutes. Remove white label marked "1". Remove the white  label marked "2". Rub patch adhesive wings for 2 additional minutes.  While looking in a mirror, press and release button in center of patch. A small green light will  flash 3-4 times. This will be your only indicator that the monitor has been turned on.  Do not shower for the first 24 hours. You may shower after the first 24 hours.  Press the button if you feel a symptom. You will hear a small click. Record Date, Time and  Symptom in the Patient Logbook.  When you are ready to remove the patch, follow instructions on the last 2 pages of Patient  Logbook. Stick patch monitor onto the last page of Patient Logbook.  Place Patient Logbook in the blue and white box. Use locking tab on box and tape box closed  securely. The blue and white box has prepaid postage on it. Please place it in the mailbox as  soon as possible. Your physician should have your test results approximately 7 days after the  monitor has been mailed back to [facility].  Call [place] at [phone] if you have questions regarding  your ZIO XT patch monitor. Call them immediately if you see an orange light blinking on your  monitor.  If your monitor falls off in less than 4 days, contact our Monitor department at [phone].  If your monitor becomes loose or falls  off after 4 days call [employer] at [phone] for  suggestions on securing your monitor     Signed, [name], MD  05/12/2021 12:49 PM    [place]

## 2021-05-12 NOTE — Telephone Encounter (Signed)
Pt states since mid April he has lost 16-17lbs (on purpose).  Last week noticed occasional dizziness (which he has had in the past) and stopped Lisinopril/HCTZ for several days.  Restarted it over the weekend due to elevated BPs.  Went and plated pickle ball Wednesday and finished up around 230pm.  That evening, he got up, walked 10 feet and passed out.  Did not get injured.  Felt a little dizzy prior to going down.  Did not take Lisinopril/HCTZ yesterday or today due to this.  BPs have been 165-173/80s.  HRs have been 42-50s.  Vitals this morning before even getting out of bed weer 168/86, 54.  Feels fatigued and weak, like he's "dragging".  Spoke with Dr. Tamala Julian and he agreed to see pt today at [facility].  Spoke with wife and made her aware of appt and she agreed pt would be here.

## 2021-05-13 LAB — CBC
Hematocrit: 49.6 % (ref 37.5–51.0)
Hemoglobin: 17.6 g/dL (ref 13.0–17.7)
MCH: 33.1 pg — ABNORMAL HIGH (ref 26.6–33.0)
MCHC: 35.5 g/dL (ref 31.5–35.7)
MCV: 93 fL (ref 79–97)
Platelets: 170 10*3/uL (ref 150–450)
RBC: 5.32 x10E6/uL (ref 4.14–5.80)
RDW: 13.8 % (ref 11.6–15.4)
WBC: 6.4 10*3/uL (ref 3.4–10.8)

## 2021-05-13 LAB — BASIC METABOLIC PANEL
BUN/Creatinine Ratio: 13 (ref 10–24)
BUN: 16 mg/dL (ref 8–27)
CO2: 25 mmol/L (ref 20–29)
Calcium: 10.9 mg/dL — ABNORMAL HIGH (ref 8.6–10.2)
Chloride: 102 mmol/L (ref 96–106)
Creatinine, Ser: 1.28 mg/dL — ABNORMAL HIGH (ref 0.76–1.27)
Glucose: 68 mg/dL (ref 65–99)
Potassium: 4.4 mmol/L (ref 3.5–5.2)
Sodium: 144 mmol/L (ref 134–144)
eGFR: 59 mL/min/{1.73_m2} — ABNORMAL LOW (ref >59)

## 2021-05-20 DIAGNOSIS — R55 Syncope and collapse: Secondary | ICD-10-CM | POA: Diagnosis not present

## 2021-09-19 NOTE — Progress Notes (Signed)
Cardiology Office Note:    Date:  09/20/2021   ID:  Dale [name], DOB March 27, 1946, MRN [id number]  PCP:  Dale Cruel, MD  Cardiologist:  Dale Grooms, MD   Referring MD: Dale Cruel, MD   Chief Complaint  Patient presents with   Loss of Consciousness    History of Present Illness:    Dale [name] is a 75 y.o. male with a hx of angina pectoris with abnormal nuclear study with inferolateral and apical ischemia, nonobstructive  epicardial coronaries, systolic murmur,  hypertension, hyperlipidemia, and resting bradycardia.    Follow-up after an episode of syncope earlier this year.  No recurrence after medication adjustment.  Very physically active.  Past Medical History:  Diagnosis Date   Allergic rhinitis    Anxiety    BPH (benign prostatic hyperplasia)    DDD (degenerative disc disease), cervical    Depression    ED (erectile dysfunction)    Exertional angina [facility])    cardiologist-  dr Dale [name]---  04-21-2013 abnormal myoview with epicardial coronaries ;  per cardiac cath 05-01-2013 no sig. obstructive CAD (LAD calcification, diffuse plaqueing) ;  evidence dRCA and LV branch vasoconstriction   GERD (gastroesophageal reflux disease)    Gout 06/2020   recent episode a few weeks ago   History of adenomatous polyp of colon    History of basal cell carcinoma (BCC) excision    History of blood transfusion    History of squamous cell carcinoma excision    09/ 27/ 2019  right lower leg    HTN (hypertension)    Hypercholesteremia    Hypogonadism in male    OA (osteoarthritis)    knees, lumbar, fingers   Seasonal allergies    Skin cancer, basal cell    Systolic murmur     Past Surgical History:  Procedure Laterality Date   APPENDECTOMY  age 39   CARDIAC CATHETERIZATION     no stent   CATARACT EXTRACTION W/ INTRAOCULAR LENS  IMPLANT, BILATERAL  07/2018   COLONOSCOPY N/A 03/02/2014   Procedure: COLONOSCOPY;  Surgeon: Dale Ng,  MD;  Location: [facility];  Service: Endoscopy;  Laterality: N/A;   CYSTOSCOPY WITH INSERTION OF UROLIFT N/A 07/04/2020   Procedure: CYSTOSCOPY WITH INSERTION OF UROLIFT;  Surgeon: Dale Espy, MD;  Location: [facility] ORS;  Service: Urology;  Laterality: N/A;   CYSTOSCOPY WITH URETHRAL DILATATION N/A 06/27/2020   Procedure: CYSTOSCOPY WITH URETHRAL DILATATION;  Surgeon: Dale Espy, MD;  Location: [facility] ORS;  Service: Urology;  Laterality: N/A;   EYE SURGERY Bilateral    cataract    FINGER SURGERY     11-14-2004 right long finger;  12-19-2007 left index finger (both done by dr sypher)   HOT HEMOSTASIS N/A 03/02/2014   Procedure: HOT HEMOSTASIS (ARGON PLASMA COAGULATION/BICAP);  Surgeon: Dale Ng, MD;  Location: [facility];  Service: Endoscopy;  Laterality: N/A;   INGUINAL HERNIA REPAIR Bilateral left , early 2000s;  right 12/ 2003   inguinal hernia, with mesh   JOINT REPLACEMENT     KNEE ARTHROSCOPY Bilateral right- last one 1996;  left , last one ,yrs ago   [place]   open   LEFT HEART CATHETERIZATION WITH CORONARY ANGIOGRAM N/A 05/01/2013   Procedure: LEFT HEART CATHETERIZATION WITH CORONARY ANGIOGRAM;  Surgeon: Dale Grooms, MD;  Location: [facility] CATH LAB;  Service: Cardiovascular;  Laterality: N/A;   MOHS SURGERY  yrs ago   nose  RECONSTRUCTION OF NOSE  1968   TOTAL KNEE ARTHROPLASTY Left 09/16/2018   Procedure: LEFT TOTAL KNEE ARTHROPLASTY;  Surgeon: Dale Cancel, MD;  Location: [facility] ORS;  Service: Orthopedics;  Laterality: Left;  70 mins   TOTAL KNEE ARTHROPLASTY Right 04/28/2019   Procedure: TOTAL KNEE ARTHROPLASTY;  Surgeon: Dale Cancel, MD;  Location: [facility];  Service: Orthopedics;  Laterality: Right;  70 mins    Current Medications: Current Meds  Medication Sig   allopurinol (ZYLOPRIM) 100 MG tablet Take 100 mg by mouth daily.   B-D 3CC LUER-LOK SYR 22GX1" 22G X 1" 3 ML MISC See admin instructions.   Calcium Carbonate Antacid (TUMS E-X PO) Take  2 tablets by mouth daily as needed (heartburn).    cholecalciferol (VITAMIN D) 1000 units tablet Take 1,000 Units by mouth daily.    colchicine 0.6 MG tablet Take 0.6-1.2 mg by mouth See admin instructions. Take 2 tablets at onset of gout attack then 1 tablet daily as needed for pain   diclofenac Sodium (VOLTAREN) 1 % GEL Apply topically.   fluticasone (FLONASE) 50 MCG/ACT nasal spray Place 1 spray into the nose daily as needed for rhinitis or allergies.   hydrocortisone cream 1 % Apply 1 application topically daily as needed for itching.    ibuprofen (ADVIL) 200 MG tablet Take 400 mg by mouth every 6 (six) hours as needed for moderate pain.   lisinopril-hydrochlorothiazide (ZESTORETIC) 10-12.5 MG tablet Take 0.5 tablets by mouth daily.   metaxalone (SKELAXIN) 800 MG tablet Take 1 tablet by mouth as needed.   methocarbamol (ROBAXIN) 500 MG tablet Take 1 tablet (500 mg total) by mouth every 6 (six) hours as needed for muscle spasms.   metoprolol succinate (TOPROL-XL) 50 MG 24 hr tablet TAKE 1 TABLET (50 MG TOTAL) BY MOUTH DAILY.   pantoprazole (PROTONIX) 40 MG tablet Take 40 mg by mouth daily as needed (heartburn).    rosuvastatin (CRESTOR) 5 MG tablet Take 1 tablet (5 mg total) by mouth daily.   tadalafil (CIALIS) 20 MG tablet Take 1 tablet by mouth as needed.   testosterone cypionate (DEPOTESTOSTERONE CYPIONATE) 200 MG/ML injection Inject 100 mg into the muscle once a week.      Allergies:   Latex, Penicillins, and Pneumococcal vaccines   Social History   Socioeconomic History   Marital status: Married    Spouse name: Dale [name]   Number of children: 2   Years of education: Not on file   Highest education level: Not on file  Occupational History   Occupation: Teacher, music    Comment: retired  Tobacco Use   Smoking status: Never   Smokeless tobacco: Never  Vaping Use   Vaping Use: Never used  Substance and Sexual Activity   Alcohol use: No   Drug use: Never   Sexual  activity: Not on file  Other Topics Concern   Not on file  Social History Narrative   Patient lives with wife.    Social Determinants of Health   Financial Resource Strain: Not on file  Food Insecurity: Not on file  Transportation Needs: Not on file  Physical Activity: Not on file  Stress: Not on file  Social Connections: Not on file     Family History: The patient's family history includes Heart disease in his father; Hypertension in his mother; Stroke in his mother.  ROS:   Please see the history of present illness.    Developed gout.  Now on colchicine.  Stop taking atorvastatin after he  researched the potential drug interaction and increased risk of rhabdomyolysis musculoskeletal pain.  He was not symptomatic.  He does have nonobstructive coronary disease and needs a lipid management.  All other systems reviewed and are negative.  EKGs/Labs/Other Studies Reviewed:    The following studies were reviewed today: Prolonged Monitor 06/13/2021: Study Highlights  Addendum by Belva Crome, MD on Tue Jun 20, 2021  9:58 AM   Basic underlying rhythm is normal sinus rhythm and sinus bradycardia with an average heart rate of 58 bpm. Relatively high burden of isolated premature ventricular contractions, 10% Accelerated idioventricular rhythm, asymptomatic. There was 1 instance. No Atrial fibrillation. Rare salvos of SVT up to 5 beats.  EKG:  EKG sinus bradycardia at 59 bpm, first-degree AV block with 210 ms PR interval.  Prominent voltage.  Recent Labs: 05/12/2021: BUN 16; Creatinine, Ser 1.28; Hemoglobin 17.6; Platelets 170; Potassium 4.4; Sodium 144  Recent Lipid Panel No results found for: CHOL, TRIG, HDL, CHOLHDL, VLDL, LDLCALC, LDLDIRECT  Physical Exam:    VS:  BP 118/68   Pulse (!) 59   Ht 5\' 7"  (1.702 m)   Wt 154 lb (69.9 kg)   SpO2 97%   BMI 24.12 kg/m     Wt Readings from Last 3 Encounters:  09/20/21 154 lb (69.9 kg)  05/12/21 155 lb (70.3 kg)  10/05/20 167 lb  (75.8 kg)     GEN: Slender and healthy appearing. No acute distress HEENT: Normal NECK: No JVD. LYMPHATICS: No lymphadenopathy CARDIAC: 2/6 right upper sternal murmur. RRR no gallop, or edema. VASCULAR:  Normal Pulses. No bruits. RESPIRATORY:  Clear to auscultation without rales, wheezing or rhonchi  ABDOMEN: Soft, non-tender, non-distended, No pulsatile mass, MUSCULOSKELETAL: No deformity  SKIN: Warm and dry NEUROLOGIC:  Alert and oriented x 3 PSYCHIATRIC:  Normal affect   ASSESSMENT:    1. Systolic murmur   2. Exertional angina (HCC)   3. Syncope and collapse   4. Essential hypertension   5. Hypercholesteremia    PLAN:    In order of problems listed above:  No recent echo.  Will consider in the future. No complaint of angina.  Suspected to be related to microvascular dysfunction. No recurrence after medication adjustment.  Episode of syncope was related to post micturition and possible vasovagal. Continue Toprol and lisinopril HCTZ 5/6.25 mg daily which is half of a 10/12.5 mg tablet. Resume statin therapy but with rosuvastatin 5 mg/day.  Liver and lipid panel in 2 months.   Medication Adjustments/Labs and Tests Ordered: Current medicines are reviewed at length with the patient today.  Concerns regarding medicines are outlined above.  Orders Placed This Encounter  Procedures   Hepatic function panel   Lipid panel   EKG 12-Lead   Meds ordered this encounter  Medications   rosuvastatin (CRESTOR) 5 MG tablet    Sig: Take 1 tablet (5 mg total) by mouth daily.    Dispense:  90 tablet    Refill:  3    Patient Instructions  Medication Instructions:  1) START Rosuvastatin 5mg  once daily  *If you need a refill on your cardiac medications before your next appointment, please call your pharmacy*   Lab Work: Lipid and Liver in 6-8 weeks. You will need to be fasting for these labs (nothing to eat or drink after midnight except water and black coffee).  If you have  labs (blood work) drawn today and your tests are completely normal, you will receive your results only by: [place] (if  you have MyChart) OR A paper copy in the mail If you have any lab test that is abnormal or we need to change your treatment, we will call you to review the results.   Testing/Procedures: None   Follow-Up: At [facility], you and your health needs are our priority.  As part of our continuing mission to provide you with exceptional heart care, we have created designated Provider Care Teams.  These Care Teams include your primary Cardiologist (physician) and Advanced Practice Providers (APPs -  Physician Assistants and Nurse Practitioners) who all work together to provide you with the care you need, when you need it.  We recommend signing up for the patient portal called "MyChart".  Sign up information is provided on this After Visit Summary.  MyChart is used to connect with patients for Virtual Visits (Telemedicine).  Patients are able to view lab/test results, encounter notes, upcoming appointments, etc.  Non-urgent messages can be sent to your provider as well.   To learn more about what you can do with MyChart, go to [email or web address].    Your next appointment:   1 year(s)  The format for your next appointment:   In Person  Provider:   You may see Dale Grooms, MD or one of the following Advanced Practice Providers on your designated Care Team:   Cecilie Kicks, NP   Other Instructions     Signed, Dale Grooms, MD  09/20/2021 11:27 AM    [place]

## 2021-09-20 ENCOUNTER — Ambulatory Visit (INDEPENDENT_AMBULATORY_CARE_PROVIDER_SITE_OTHER): Payer: Medicare Other | Admitting: Interventional Cardiology

## 2021-09-20 ENCOUNTER — Other Ambulatory Visit: Payer: Self-pay

## 2021-09-20 ENCOUNTER — Encounter: Payer: Self-pay | Admitting: Interventional Cardiology

## 2021-09-20 VITALS — BP 118/68 | HR 59 | Ht 67.0 in | Wt 154.0 lb

## 2021-09-20 DIAGNOSIS — I208 Other forms of angina pectoris: Secondary | ICD-10-CM

## 2021-09-20 DIAGNOSIS — R011 Cardiac murmur, unspecified: Secondary | ICD-10-CM

## 2021-09-20 DIAGNOSIS — R55 Syncope and collapse: Secondary | ICD-10-CM

## 2021-09-20 DIAGNOSIS — I10 Essential (primary) hypertension: Secondary | ICD-10-CM | POA: Diagnosis not present

## 2021-09-20 DIAGNOSIS — E7800 Pure hypercholesterolemia, unspecified: Secondary | ICD-10-CM

## 2021-09-20 MED ORDER — ROSUVASTATIN CALCIUM 5 MG PO TABS: 5.0000 mg | ORAL_TABLET | Freq: Every day | ORAL | 3 refills | Status: AC

## 2021-09-20 NOTE — Patient Instructions (Signed)
Medication Instructions:  1) START Rosuvastatin 5mg  once daily  *If you need a refill on your cardiac medications before your next appointment, please call your pharmacy*   Lab Work: Lipid and Liver in 6-8 weeks. You will need to be fasting for these labs (nothing to eat or drink after midnight except water and black coffee).  If you have labs (blood work) drawn today and your tests are completely normal, you will receive your results only by: [place] (if you have MyChart) OR A paper copy in the mail If you have any lab test that is abnormal or we need to change your treatment, we will call you to review the results.   Testing/Procedures: None   Follow-Up: At [facility], you and your health needs are our priority.  As part of our continuing mission to provide you with exceptional heart care, we have created designated Provider Care Teams.  These Care Teams include your primary Cardiologist (physician) and Advanced Practice Providers (APPs -  Physician Assistants and Nurse Practitioners) who all work together to provide you with the care you need, when you need it.  We recommend signing up for the patient portal called "MyChart".  Sign up information is provided on this After Visit Summary.  MyChart is used to connect with patients for Virtual Visits (Telemedicine).  Patients are able to view lab/test results, encounter notes, upcoming appointments, etc.  Non-urgent messages can be sent to your provider as well.   To learn more about what you can do with MyChart, go to [email or web address].    Your next appointment:   1 year(s)  The format for your next appointment:   In Person  Provider:   You may see [name], MD or one of the following Advanced Practice Providers on your designated Care Team:   Cecilie Kicks, NP   Other Instructions

## 2021-10-19 ENCOUNTER — Encounter: Payer: Self-pay | Admitting: Interventional Cardiology

## 2021-10-19 ENCOUNTER — Other Ambulatory Visit: Payer: Self-pay | Admitting: Family Medicine

## 2021-10-19 ENCOUNTER — Ambulatory Visit
Admission: RE | Admit: 2021-10-19 | Discharge: 2021-10-19 | Disposition: A | Discharge status: Home / Self Care | Payer: Medicare Other | Source: Ambulatory Visit | Attending: Family Medicine | Admitting: Family Medicine

## 2021-10-19 DIAGNOSIS — R10A0 Flank pain, unspecified side: Secondary | ICD-10-CM

## 2021-10-19 DIAGNOSIS — R109 Unspecified abdominal pain: Secondary | ICD-10-CM

## 2021-10-19 DIAGNOSIS — R319 Hematuria, unspecified: Secondary | ICD-10-CM

## 2021-10-19 IMAGING — CT CT ABD-PELV W/O CM
2 of 4 series · 16 of 46 positions shown, 18 images · non-contrast
Comparison: [DATE], [DATE]

CLINICAL DATA: Right flank pain for 2 days, hematuria

EXAM:
CT ABDOMEN AND PELVIS WITHOUT CONTRAST
TECHNIQUE: Multidetector CT imaging of the abdomen and pelvis was performed
following the standard protocol without IV contrast.

[Series 2: renal stone 5.00 br40 s3 axial · axial · 0.73mm/px · z∈[+1129,+1539]mm · 13 of 90 slices shown, 15 images]
[im 4/90  soft-tissue]
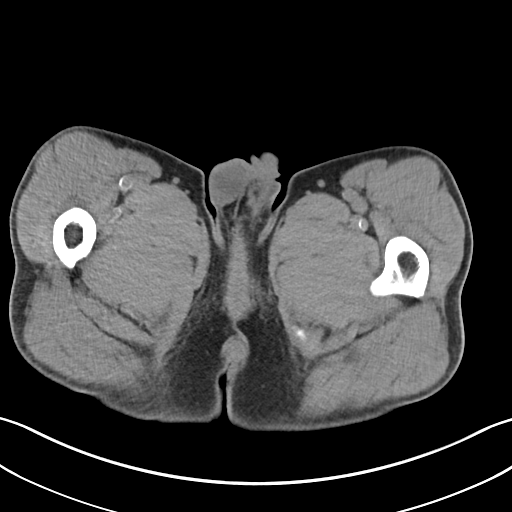
[im 4/90  bone]
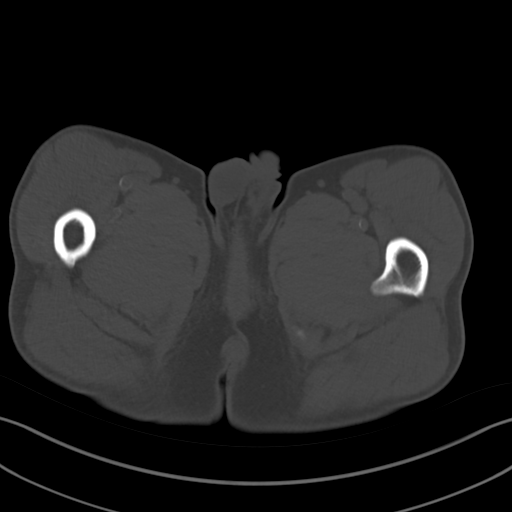
[im 12/90  soft-tissue]
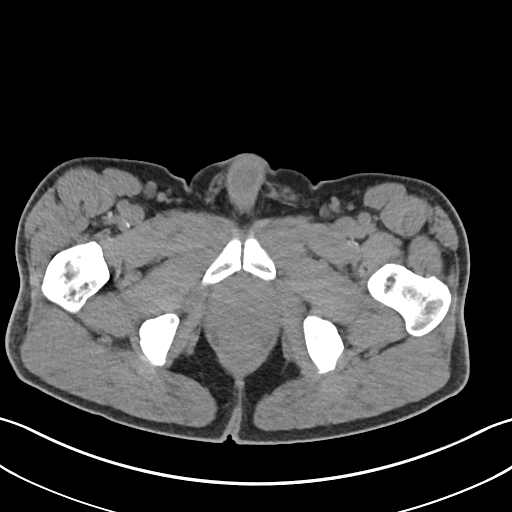
[im 19/90  soft-tissue]
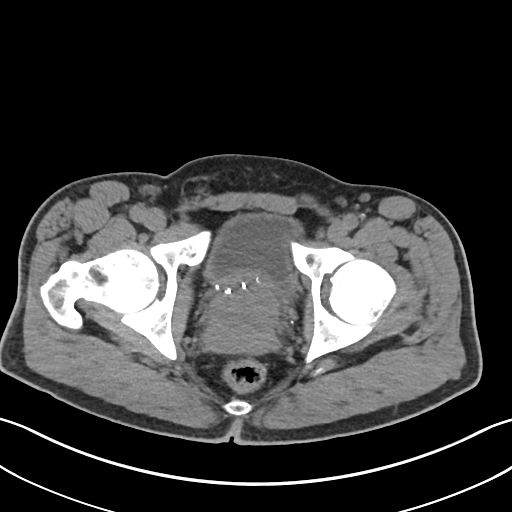
[im 26/90  soft-tissue]
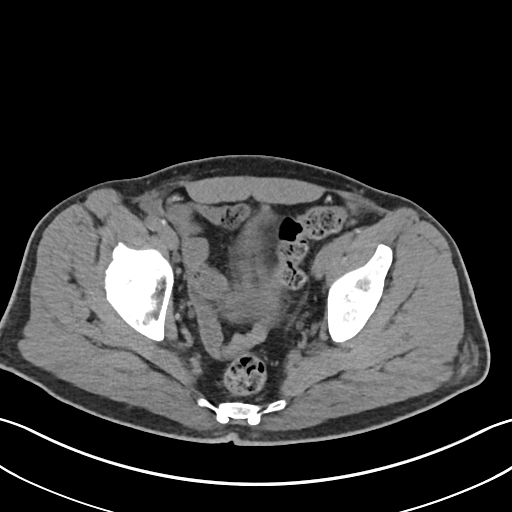
[im 30/90  soft-tissue]
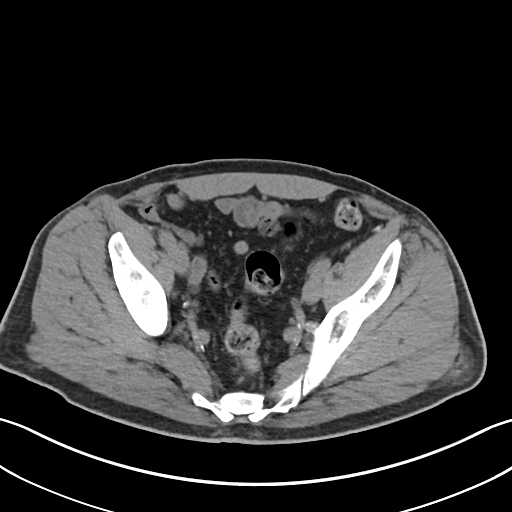
[im 38/90  soft-tissue]
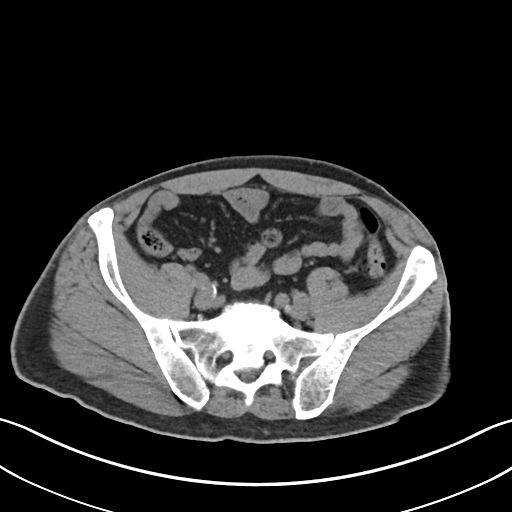
[im 45/90  soft-tissue]
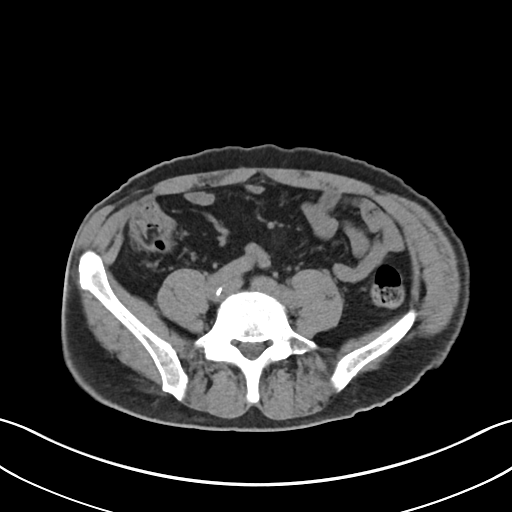
[im 52/90  soft-tissue]
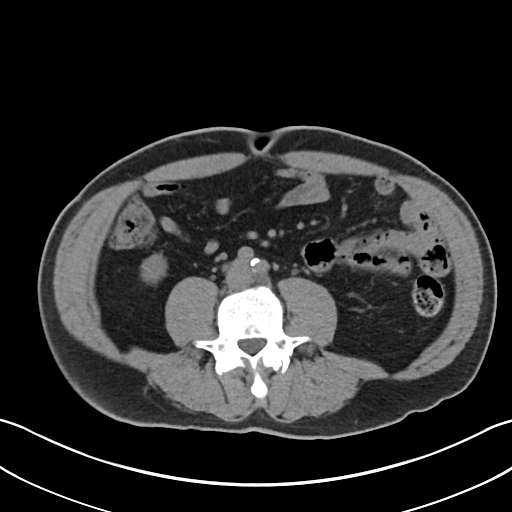
[im 60/90  soft-tissue]
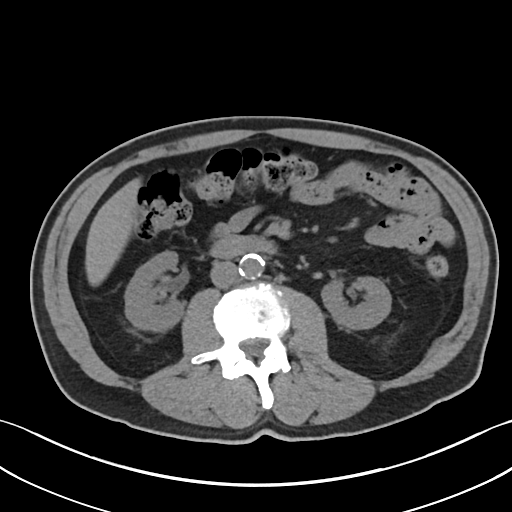
[im 60/90  bone]
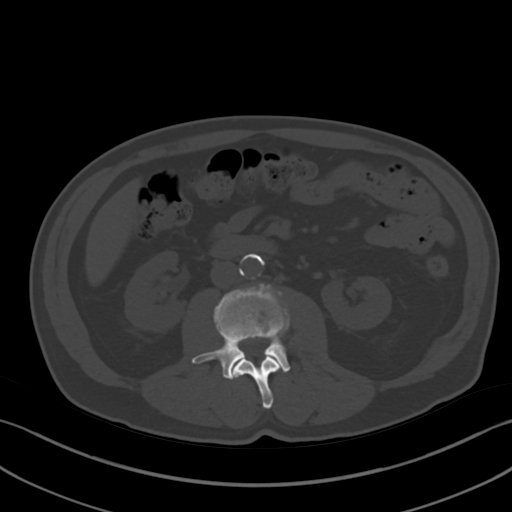
[im 64/90  soft-tissue]
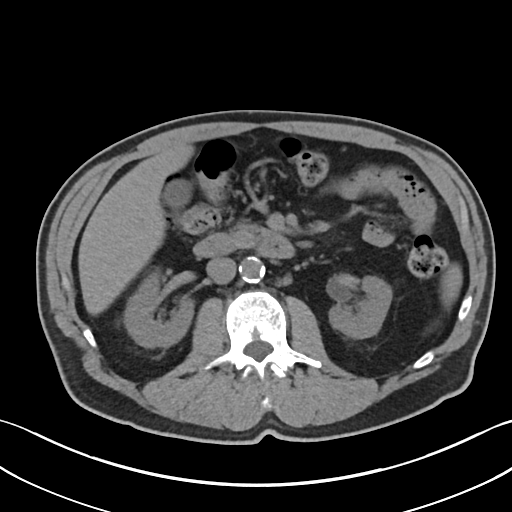
[im 71/90  soft-tissue]
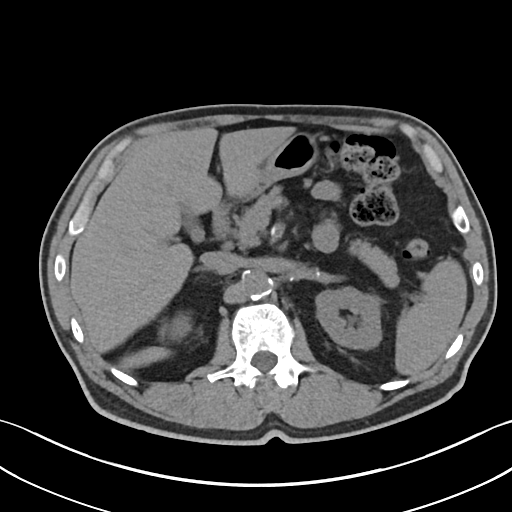
[im 78/90  soft-tissue]
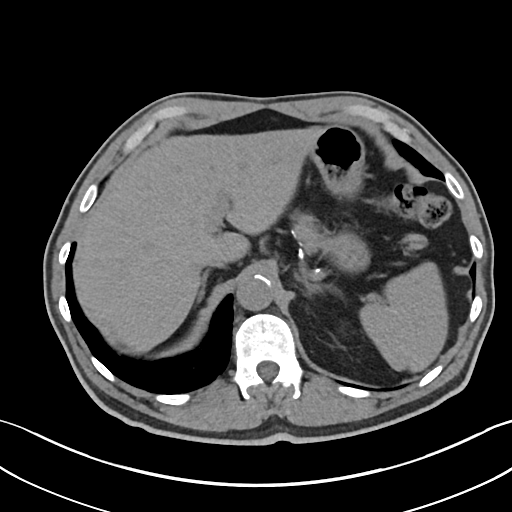
[im 86/90  soft-tissue]
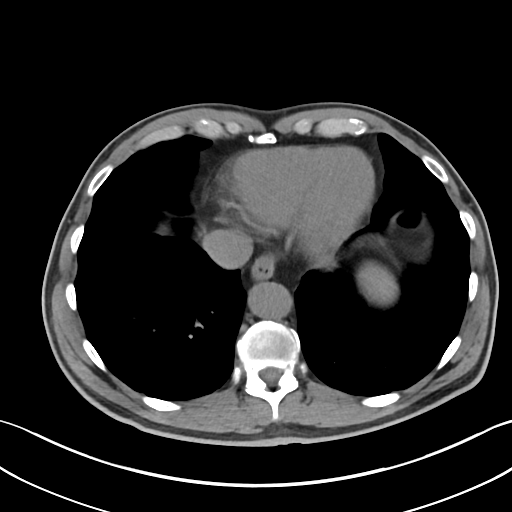

[Series 6: renal stone 2.00 br40 s3 cor · coronal · 0.73mm/px · 3 of 185 slices shown]
[im 62/185  soft-tissue]
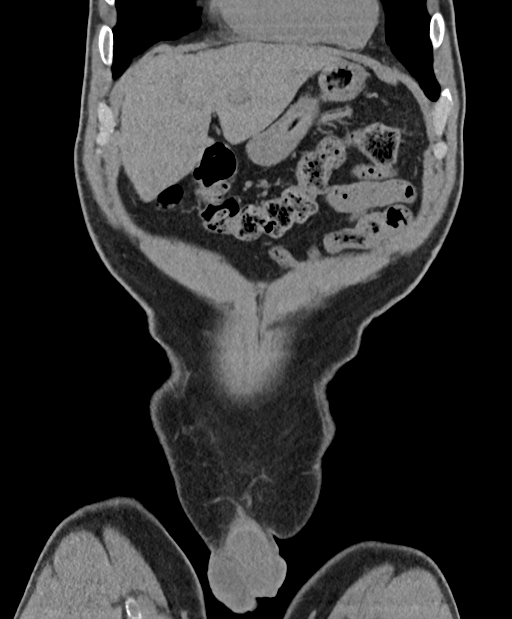
[im 82/185  soft-tissue]
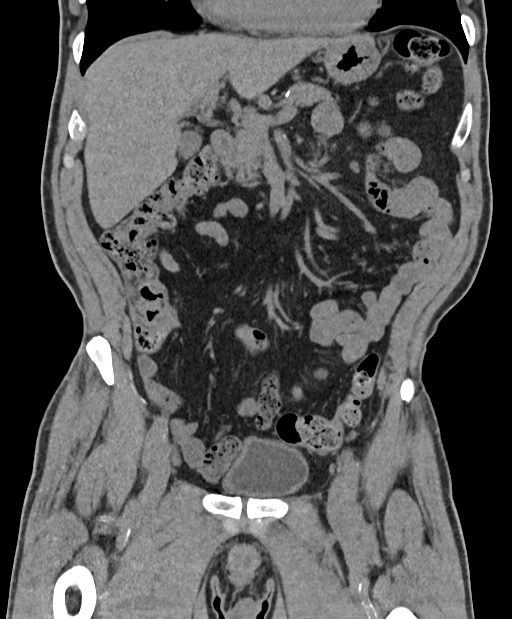
[im 103/185  soft-tissue]
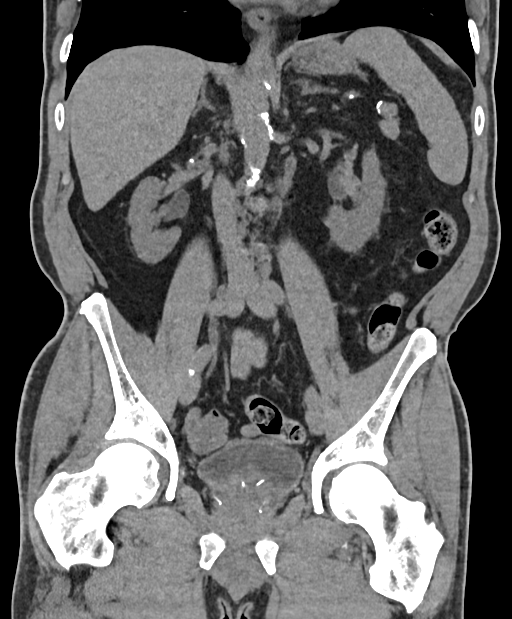

[16 of 46 positions shown; findings below may reference images not displayed]

FINDINGS: Lower chest: 4 mm right middle lobe pulmonary nodule, stable since
[N7], benign. Hazy ground-glass opacity within the medial aspect of
the right lung base. Left lung bases clear. No pericardial effusion.

Hepatobiliary: 2.2 cm low-density lesion in the subcapsular aspect
of the posterior right hepatic lobe, unchanged from [N7]. Appearance
on previous contrast enhanced CT suggests hemangioma. Otherwise
unremarkable unenhanced appearance of the liver. No new focal liver
lesion. Unremarkable gallbladder. No hyperdense gallstone. No
biliary dilatation.

Pancreas: Unremarkable. No pancreatic ductal dilatation or
surrounding inflammatory changes.

Spleen: Normal in size without focal abnormality.

Adrenals/Urinary Tract: Adrenal glands are unremarkable. Kidneys are
normal, without renal calculi, focal lesion, or hydronephrosis. No
ureteral calculi identified. Borderline urinary bladder wall
thickening.

Stomach/Bowel: Stomach is within normal limits. Appendix is
surgically absent. No evidence of bowel wall thickening, distention,
or inflammatory changes.

Vascular/Lymphatic: Aortic atherosclerosis. No enlarged abdominal or
pelvic lymph nodes.

Reproductive: Prostatomegaly. Postsurgical changes of interval Uro
lift procedure.

Other: No free fluid. No abdominopelvic fluid collection. No
pneumoperitoneum. No abdominal wall hernia.

Musculoskeletal: No acute or significant osseous findings.
IMPRESSION: 1. No CT findings to explain the patient's right flank pain.
Specifically, no evidence of obstructive uropathy.
2. Hazy ground-glass opacity within the medial aspect of the right
lung base may represent atelectasis or developing pneumonia.
3. Mild diffuse urinary bladder wall thickening, likely on the basis
of chronic outlet obstruction.
4. Prostatomegaly.

Aortic Atherosclerosis ([N7]-[N7]).

## 2021-10-20 ENCOUNTER — Other Ambulatory Visit: Payer: Medicare Other | Admitting: *Deleted

## 2021-10-20 ENCOUNTER — Other Ambulatory Visit: Payer: Self-pay

## 2021-10-20 DIAGNOSIS — E7800 Pure hypercholesterolemia, unspecified: Secondary | ICD-10-CM

## 2021-10-20 LAB — LIPID PANEL
Chol/HDL Ratio: 3.3 ratio (ref 0.0–5.0)
Cholesterol, Total: 129 mg/dL (ref 100–199)
HDL: 39 mg/dL — ABNORMAL LOW (ref >39)
LDL Chol Calc (NIH): 67 mg/dL (ref 0–99)
Triglycerides: 126 mg/dL (ref 0–149)
VLDL Cholesterol Cal: 23 mg/dL (ref 5–40)

## 2021-10-20 LAB — HEPATIC FUNCTION PANEL
ALT: 24 IU/L (ref 0–44)
AST: 27 IU/L (ref 0–40)
Albumin: 4.1 g/dL (ref 3.7–4.7)
Alkaline Phosphatase: 69 IU/L (ref 44–121)
Bilirubin Total: 0.8 mg/dL (ref 0.0–1.2)
Bilirubin, Direct: 0.21 mg/dL (ref 0.00–0.40)
Total Protein: 6.5 g/dL (ref 6.0–8.5)

## 2021-11-01 ENCOUNTER — Other Ambulatory Visit: Payer: Medicare Other

## 2021-12-07 ENCOUNTER — Other Ambulatory Visit: Payer: Self-pay

## 2021-12-07 DIAGNOSIS — R1011 Right upper quadrant pain: Secondary | ICD-10-CM

## 2021-12-18 ENCOUNTER — Other Ambulatory Visit: Payer: Self-pay | Admitting: Interventional Cardiology

## 2021-12-28 ENCOUNTER — Other Ambulatory Visit: Payer: Medicare Other

## 2022-01-01 ENCOUNTER — Ambulatory Visit
Admission: RE | Admit: 2022-01-01 | Discharge: 2022-01-01 | Disposition: A | Discharge status: Home / Self Care | Payer: Medicare Other | Source: Ambulatory Visit | Attending: Gastroenterology | Admitting: Gastroenterology

## 2022-01-01 DIAGNOSIS — R1011 Right upper quadrant pain: Secondary | ICD-10-CM

## 2022-01-01 IMAGING — CT CT CHEST W/O CM
1 series · 15 of 34 positions shown, 19 images · non-contrast
Comparison: Abdomen pelvis CT, dated [DATE]

CLINICAL DATA: History of basal and squamous cell carcinoma sent to
follow-up abnormal lung bases seen on abdomen pelvis CT.



[Series 2: chest w/(date) · axial · 0.72mm/px · z∈[+244,+520]mm · 15 of 163 slices shown, 19 images]
[im 13/163  mediastinal]
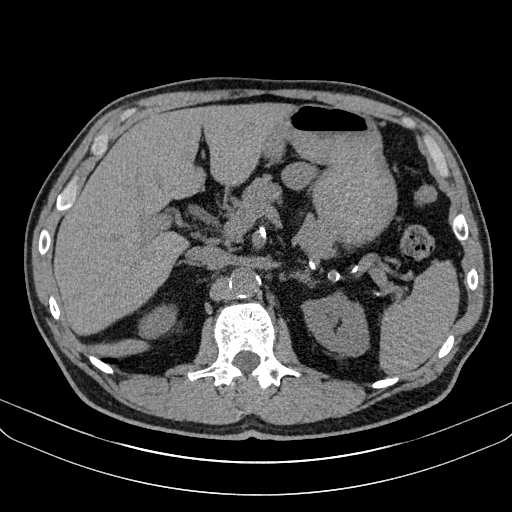
[im 13/163  lung]
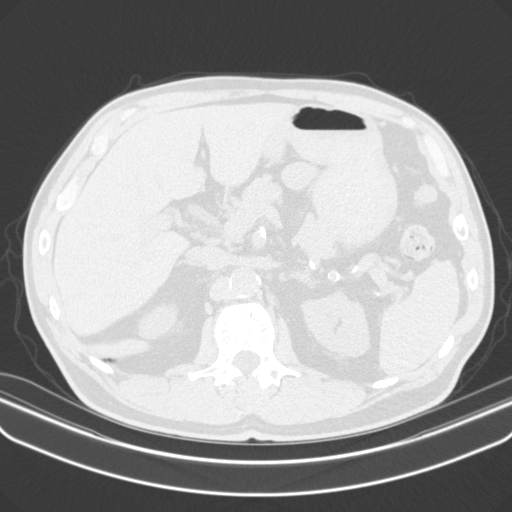
[im 25/163  lung]
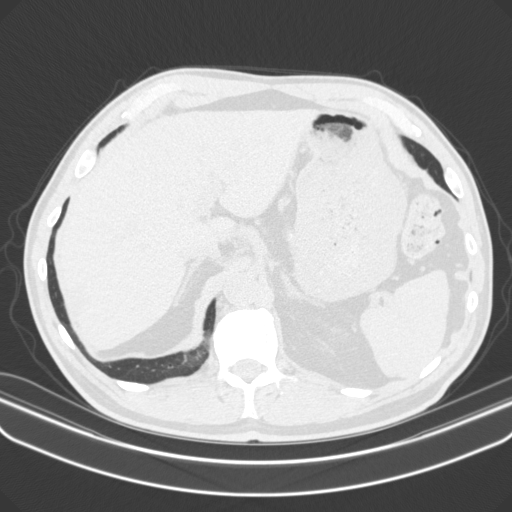
[im 33/163  lung]
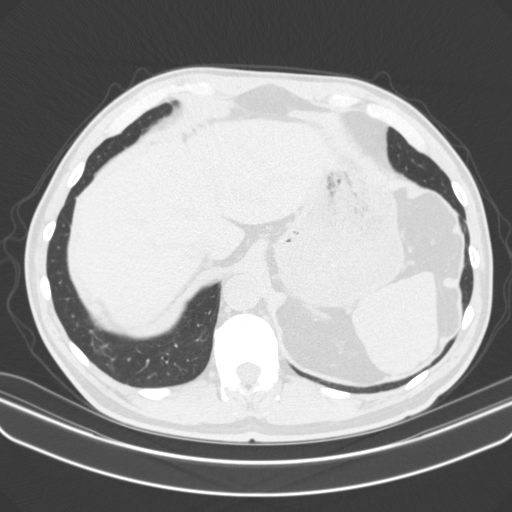
[im 43/163  lung]
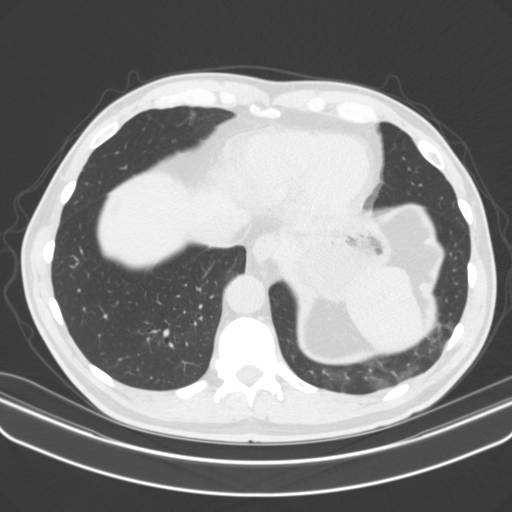
[im 55/163  mediastinal]
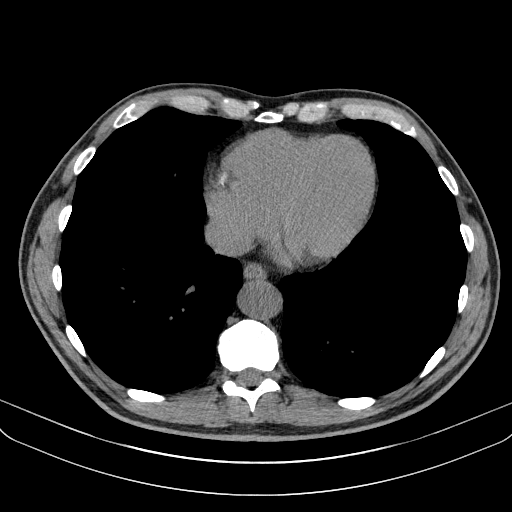
[im 55/163  lung]
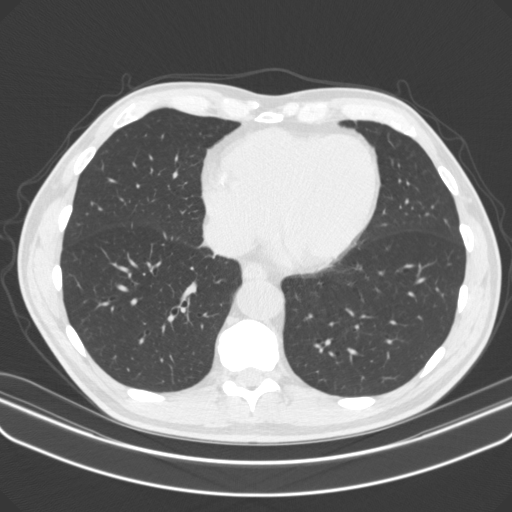
[im 65/163  lung]
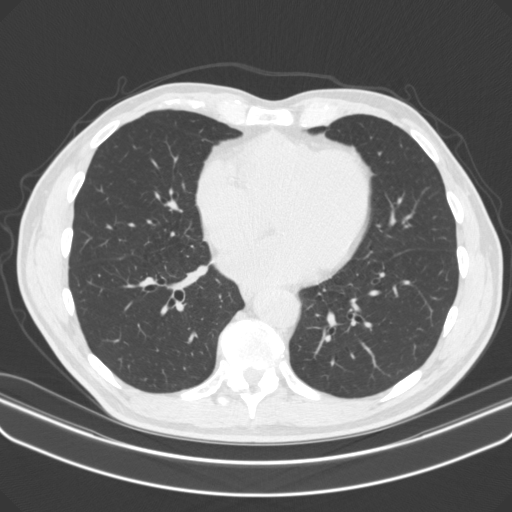
[im 73/163  lung]
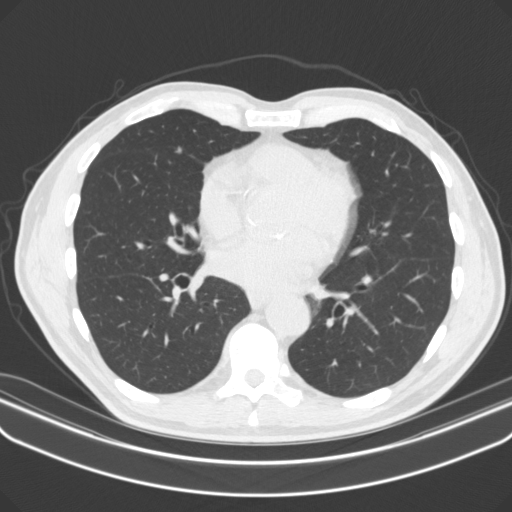
[im 85/163  lung]
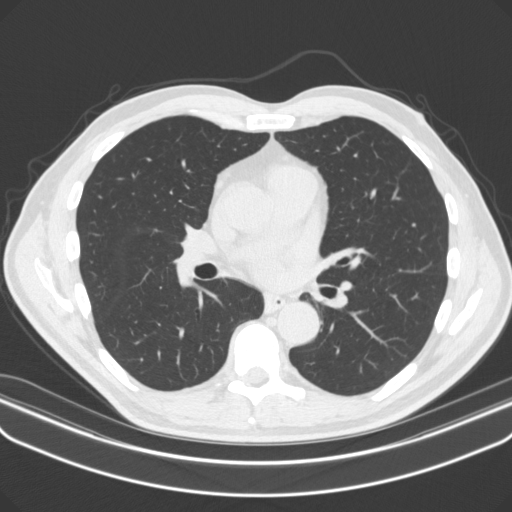
[im 91/163  mediastinal]
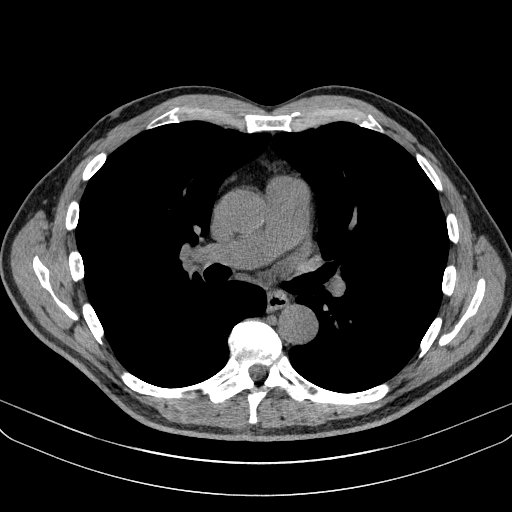
[im 91/163  lung]
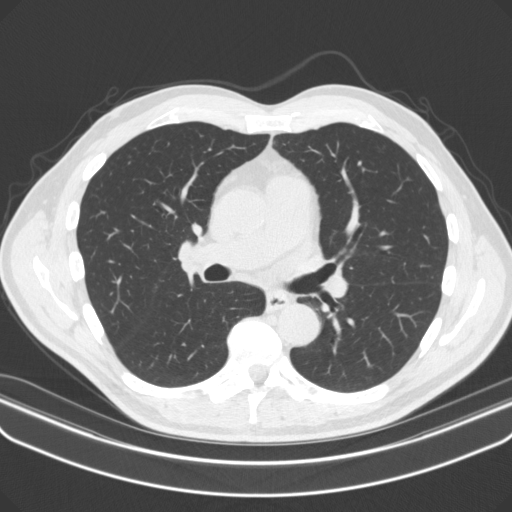
[im 98/163  lung]
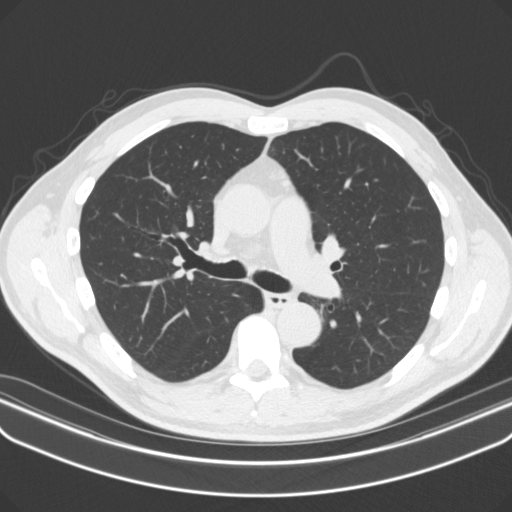
[im 109/163  lung]
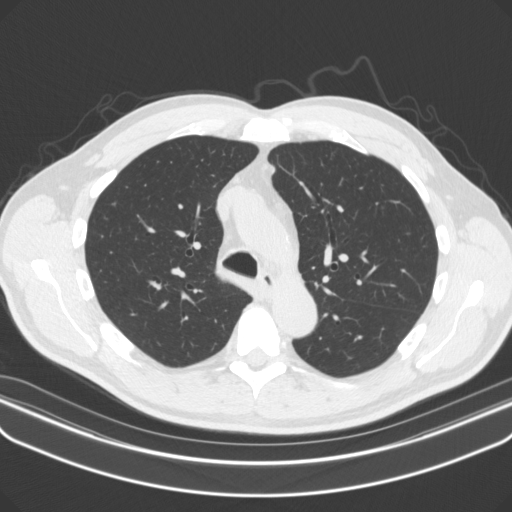
[im 121/163  lung]
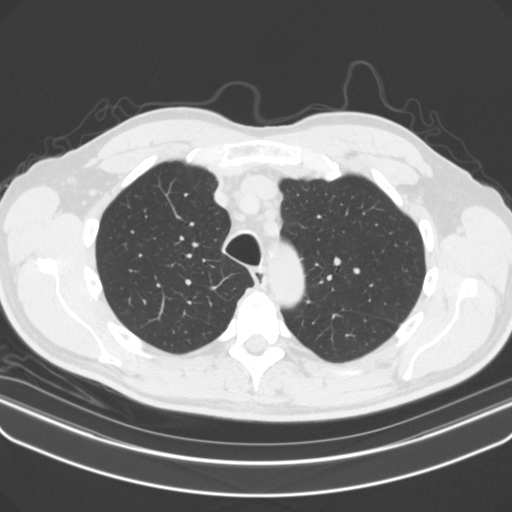
[im 130/163  mediastinal]
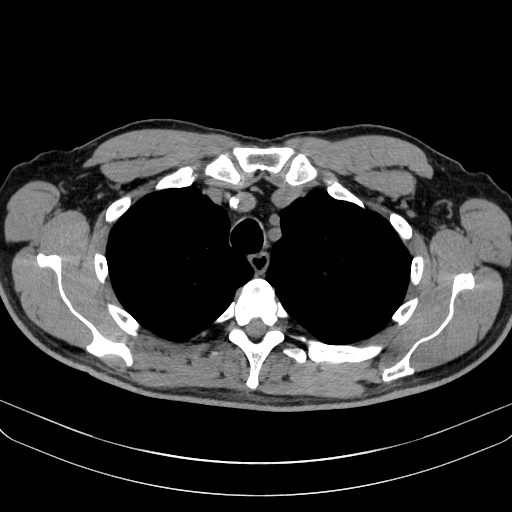
[im 130/163  lung]
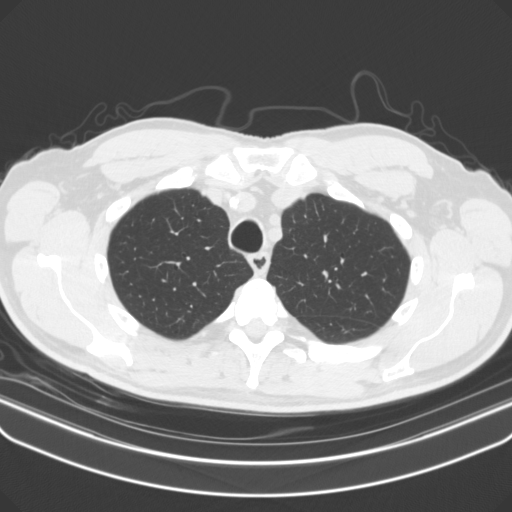
[im 139/163  lung]
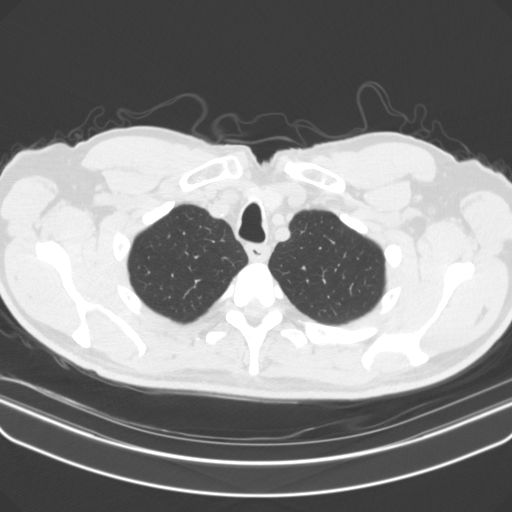
[im 151/163  lung]
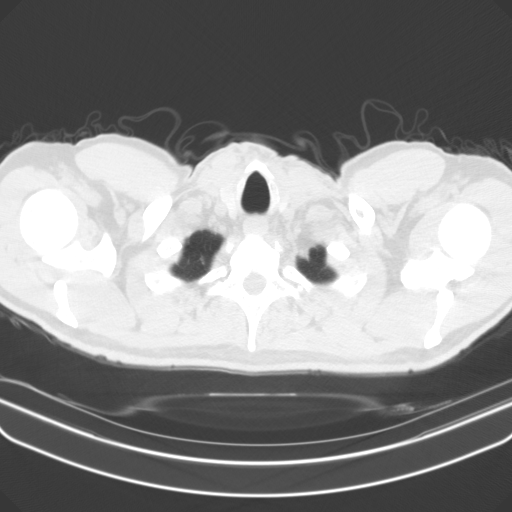

[15 of 34 positions shown; findings below may reference images not displayed]

FINDINGS: Cardiovascular: There is mild calcification of the aortic arch,
without evidence of aneurysmal dilatation. Normal heart size with
moderate to marked severity coronary artery calcification. No
pericardial effusion.

Mediastinum/Nodes: No enlarged mediastinal or axillary lymph nodes.
Thyroid gland, trachea, and esophagus demonstrate no significant
findings.

Lungs/Pleura: A 2 mm calcified lung nodule is noted within the left
upper lobe (axial CT image 23, CT series 5).

Very mild scarring and/or atelectasis is seen within the
posteromedial aspect of the right lung base. This is decreased in
severity when compared to the prior study.

A stable 3 mm solid, noncalcified lung nodule is noted within the
posteromedial aspect of the right middle lobe (axial CT image 119,
CT series 5).

Upper Abdomen: No acute abnormality.

Musculoskeletal: No chest wall mass or suspicious bone lesions
identified.
IMPRESSION: 1. Very mild posteromedial right basilar scarring and/or
atelectasis.
2. Stable 3 mm solid, noncalcified right middle lobe lung nodule. No
follow-up needed if patient is low-risk. Non-contrast chest CT can
be considered in 12 months if patient is high-risk. This
recommendation follows the consensus statement: Guidelines for
Management of Incidental Pulmonary Nodules Detected on CT Images:
3. Moderate to marked severity coronary artery calcification.

Aortic Atherosclerosis ([9T]-[9T]).

## 2022-01-04 ENCOUNTER — Encounter: Payer: Self-pay | Admitting: Interventional Cardiology

## 2022-01-19 ENCOUNTER — Other Ambulatory Visit (HOSPITAL_COMMUNITY): Payer: Self-pay | Admitting: Sports Medicine

## 2022-01-19 DIAGNOSIS — M25522 Pain in left elbow: Secondary | ICD-10-CM

## 2022-01-31 ENCOUNTER — Other Ambulatory Visit: Payer: Self-pay

## 2022-01-31 ENCOUNTER — Ambulatory Visit (HOSPITAL_COMMUNITY)
Admission: RE | Admit: 2022-01-31 | Discharge: 2022-01-31 | Disposition: A | Discharge status: Home / Self Care | Payer: Medicare Other | Source: Ambulatory Visit | Attending: Sports Medicine | Admitting: Sports Medicine

## 2022-01-31 DIAGNOSIS — M25522 Pain in left elbow: Secondary | ICD-10-CM | POA: Insufficient documentation

## 2022-01-31 IMAGING — MR MR ELBOW*L* W/O CM
4 of 5 series · 19 of 40 positions shown · non-contrast
Comparison: None.

CLINICAL DATA: Left elbow pain

EXAM:
MRI OF THE LEFT ELBOW WITHOUT CONTRAST
TECHNIQUE: Multiplanar, multisequence MR imaging of the elbow was performed. No
intravenous contrast was administered.

[Series 11: T2 fat-sat · axial · 3.0mm · 0.23mm/px · z∈[-66,+26]mm · 7 of 24 slices shown (1 of 3)]
[im 1/24]
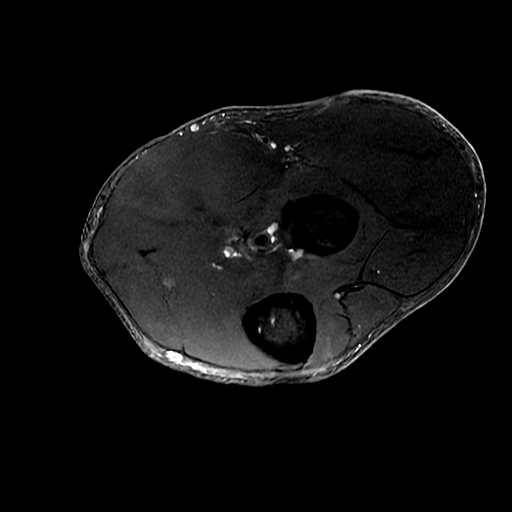
[im 4/24]
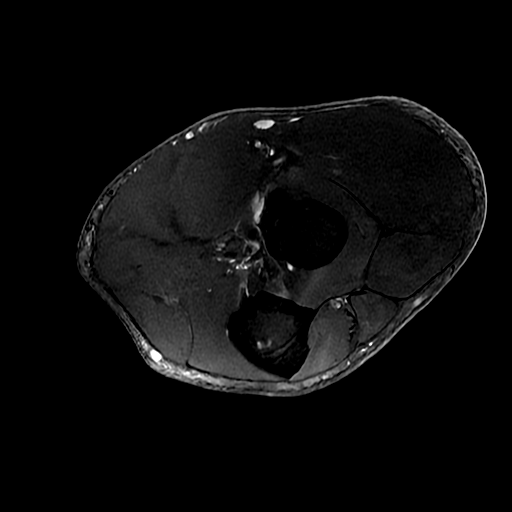
[im 8/24]
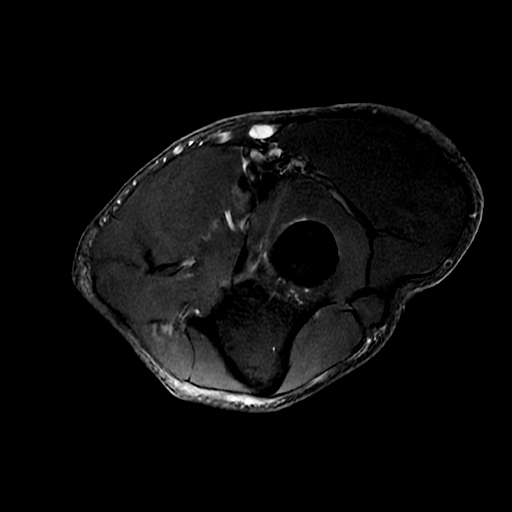
[im 12/24]
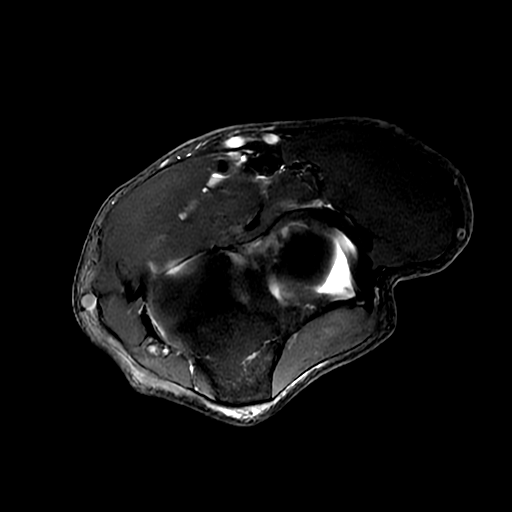
[im 16/24]
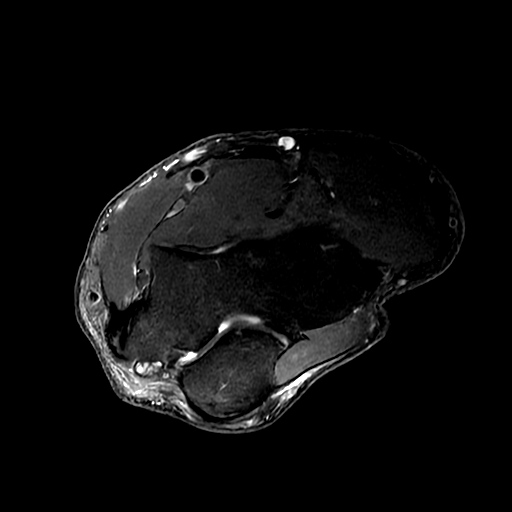
[im 20/24]
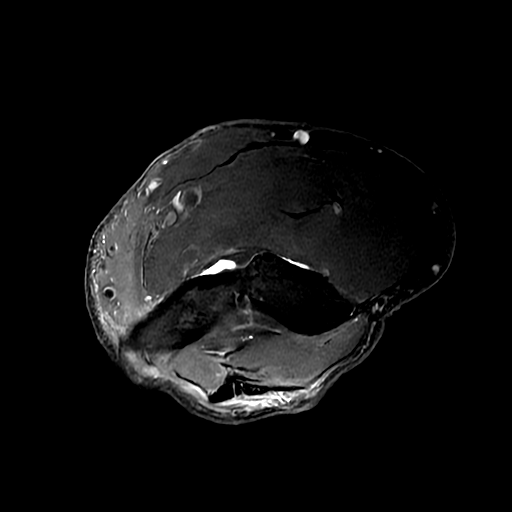
[im 24/24]
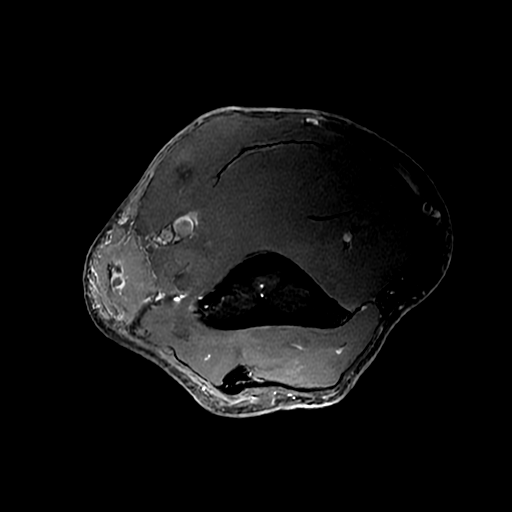

[Series 12: T1 · axial · 3.0mm · 0.23mm/px · z∈[-54,+10]mm · 3 of 24 slices shown]
[im 4/24]
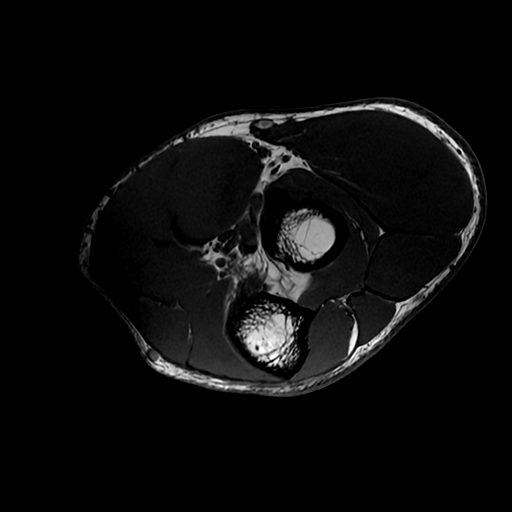
[im 14/24]
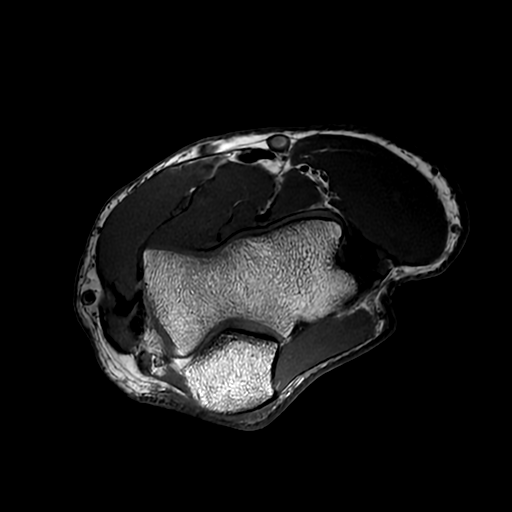
[im 20/24]
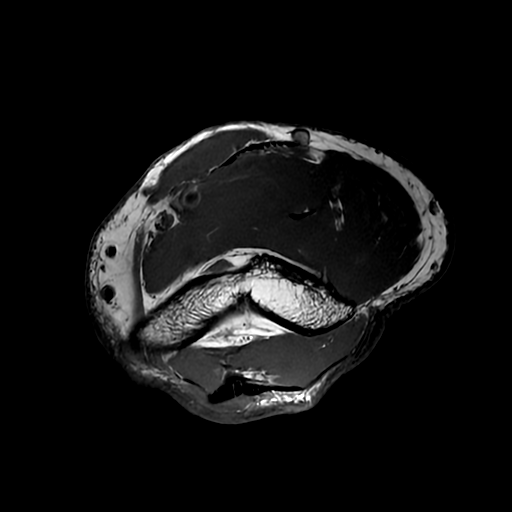

[Series 13: T2 fat-sat · coronal · 3.0mm · 0.27mm/px · 6 of 24 slices shown (2 of 3)]
[im 1/24]
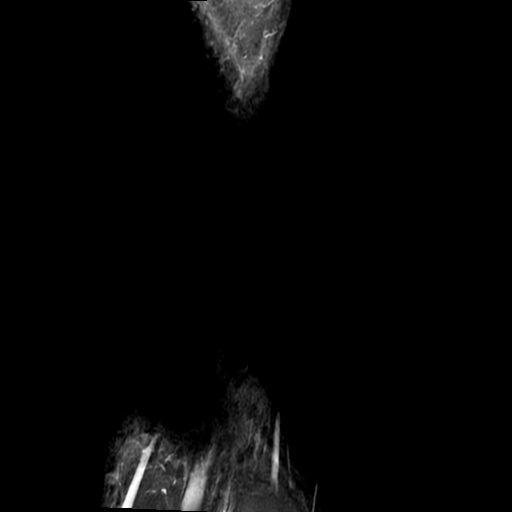
[im 4/24]
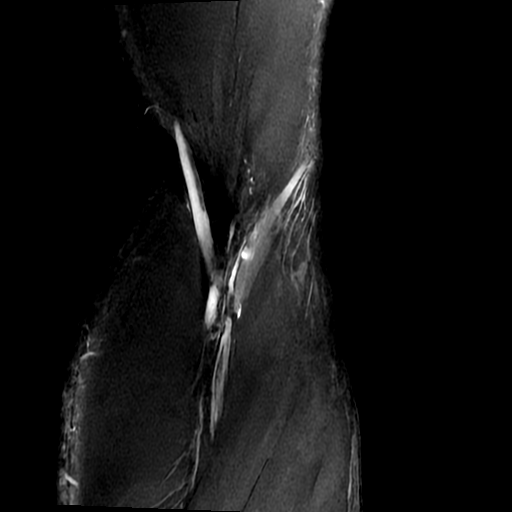
[im 7/24]
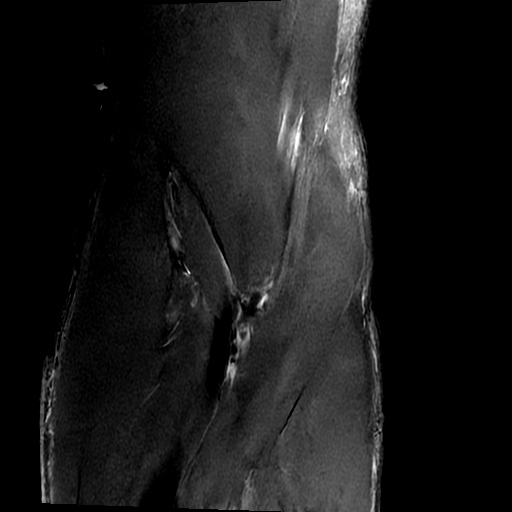
[im 10/24]
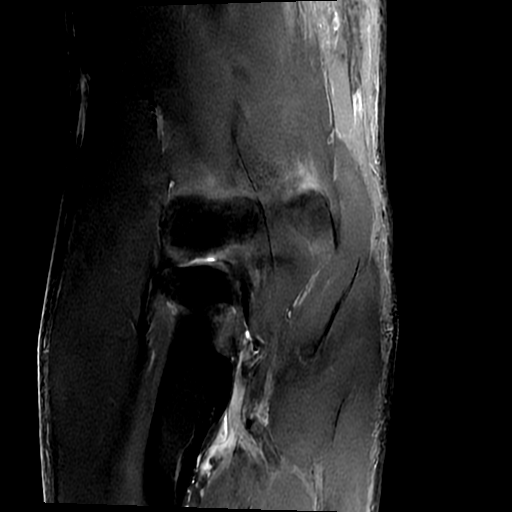
[im 14/24]
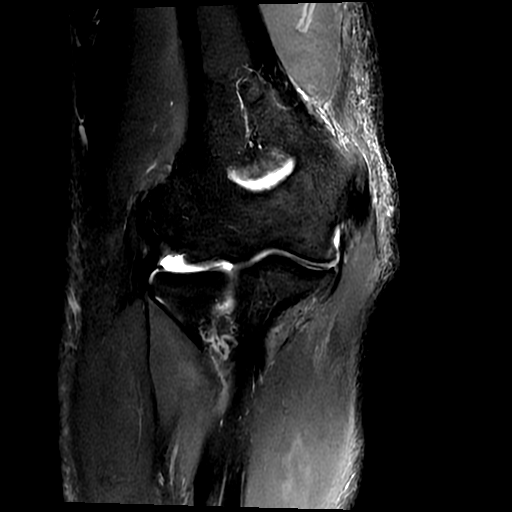
[im 20/24]
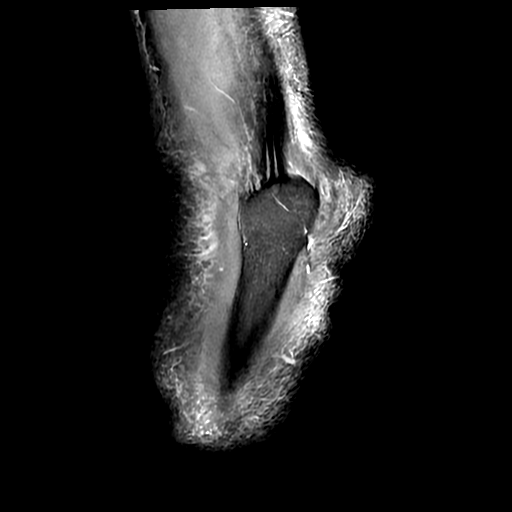

[Series 15: T2 fat-sat · sagittal · 3.0mm · 0.27mm/px · 3 of 27 slices shown (3 of 3)]
[im 4/27]
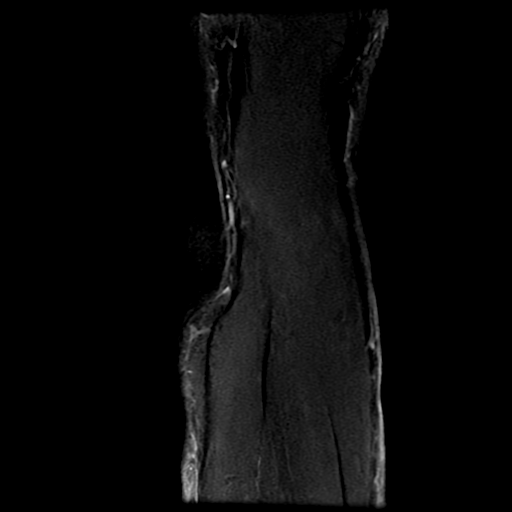
[im 14/27]
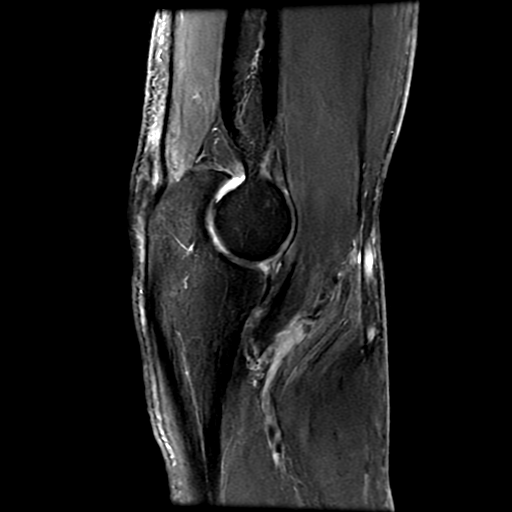
[im 23/27]
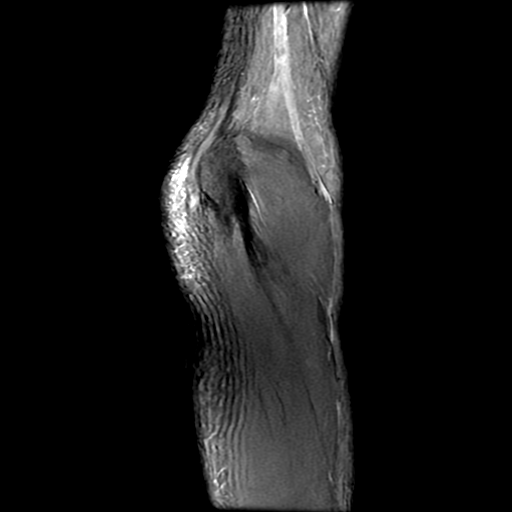

[19 of 40 positions shown; findings below may reference images not displayed]

FINDINGS: TENDONS

Common forearm flexor origin: Mild tendinosis of the common flexor
tendon origin without tear.

Common forearm extensor origin: Mild tendinosis of the common
extensor tendon origin without tear.

Biceps: Mild distal biceps tendinosis without tear. No
bicipitoradialis bursal fluid.

Triceps: Intact.

LIGAMENTS

Medial stabilizers: Intact.

Lateral stabilizers:  Intact.

Cartilage: No focal chondral defect identified.

Joint: No joint effusion or intra-articular loose body.

Cubital tunnel: Unremarkable.  The ulnar nerve appears normal.

Bones: No acute fracture. No dislocation. No bone marrow edema. No
bone lesion.

Muscles: Normal bulk and signal intensity of the included
musculature.

Soft tissues: No soft tissue edema or fluid collection.

Other: None.
IMPRESSION: 1. Mild tendinosis of the common flexor and common extensor tendons.
2. Mild distal biceps tendinosis without tear.

## 2022-04-10 ENCOUNTER — Other Ambulatory Visit (HOSPITAL_COMMUNITY): Payer: Self-pay | Admitting: Sports Medicine

## 2022-04-10 ENCOUNTER — Other Ambulatory Visit: Payer: Self-pay | Admitting: Sports Medicine

## 2022-04-10 DIAGNOSIS — M542 Cervicalgia: Secondary | ICD-10-CM

## 2022-04-25 ENCOUNTER — Ambulatory Visit (HOSPITAL_COMMUNITY)
Admission: RE | Admit: 2022-04-25 | Discharge: 2022-04-25 | Disposition: A | Discharge status: Home / Self Care | Payer: Medicare Other | Source: Ambulatory Visit | Attending: Sports Medicine | Admitting: Sports Medicine

## 2022-04-25 DIAGNOSIS — M542 Cervicalgia: Secondary | ICD-10-CM | POA: Diagnosis present

## 2022-04-25 IMAGING — MR MR CERVICAL SPINE W/O CM
4 of 5 series · 19 of 48 positions shown · non-contrast
Comparison: None Available.

CLINICAL DATA: Right arm pain and numbness since [REDACTED]

EXAM:
MRI CERVICAL SPINE WITHOUT CONTRAST
TECHNIQUE: Multiplanar, multisequence MR imaging of the cervical spine was
performed. No intravenous contrast was administered.

[Series 3: T2 · sagittal · 3.0mm · 0.43mm/px · 4 of 16 slices shown (1 of 2)]
[im 1/16]
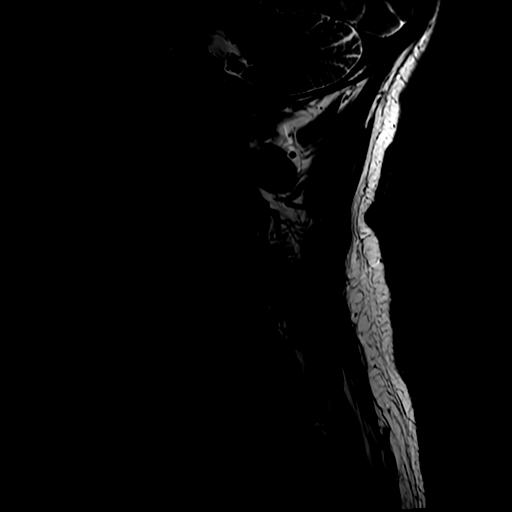
[im 6/16]
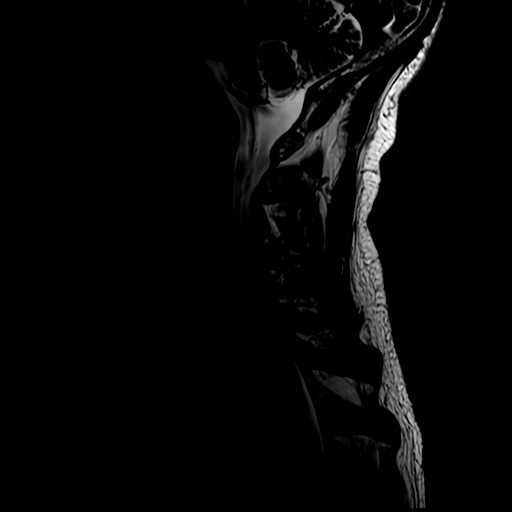
[im 11/16]
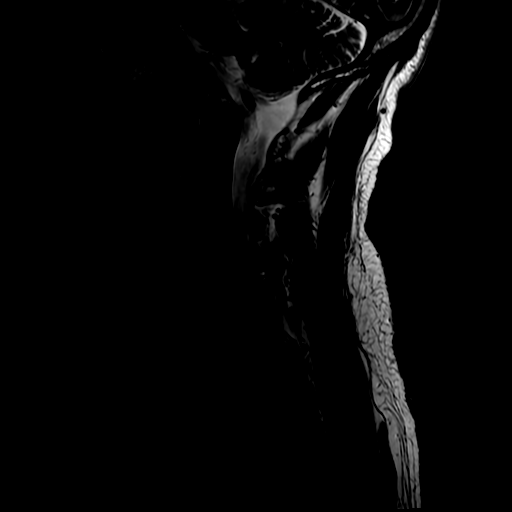
[im 16/16]
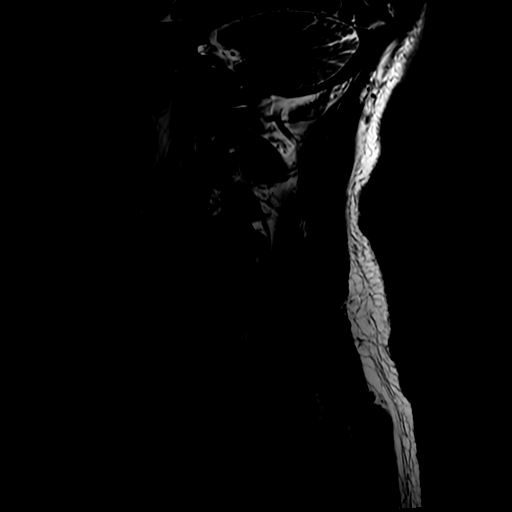

[Series 4: FLAIR · sagittal · 3.0mm · 0.43mm/px · 3 of 16 slices shown]
[im 1/16]
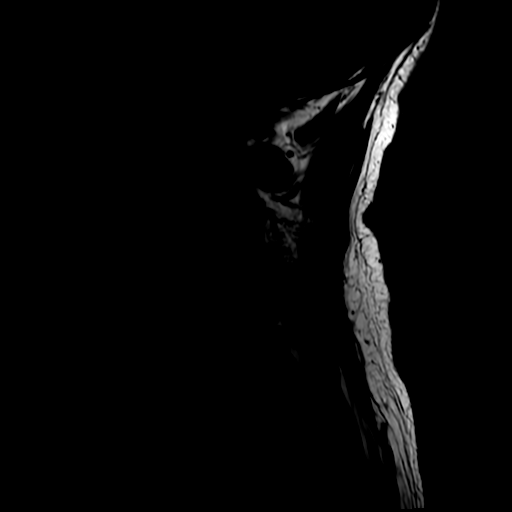
[im 8/16]
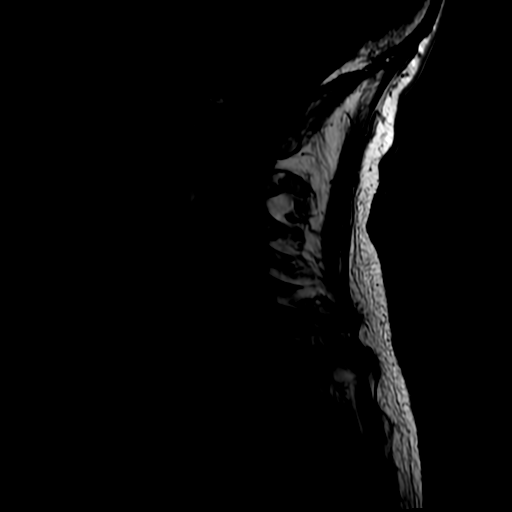
[im 16/16]
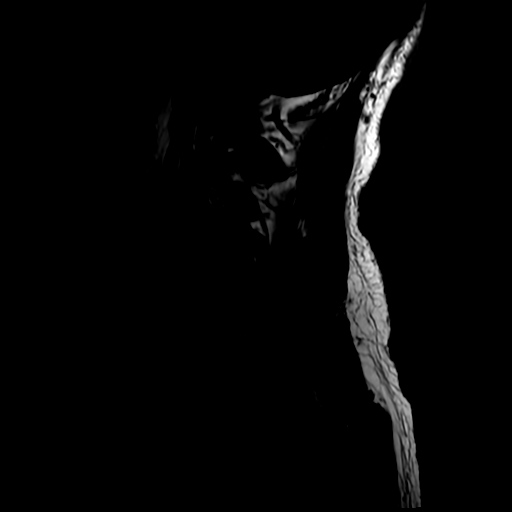

[Series 5: STIR · sagittal · 3.0mm · 0.43mm/px · 3 of 16 slices shown]
[im 1/16]
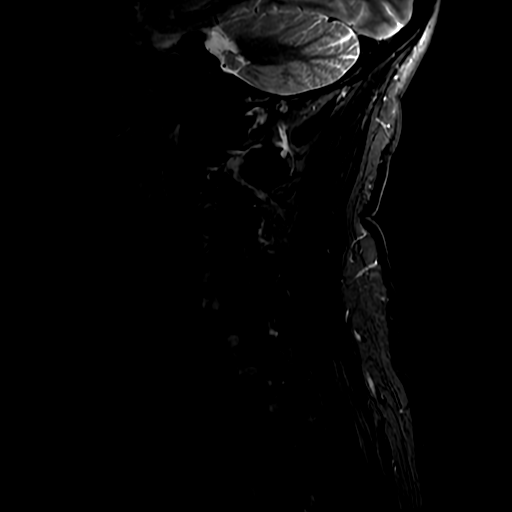
[im 8/16]
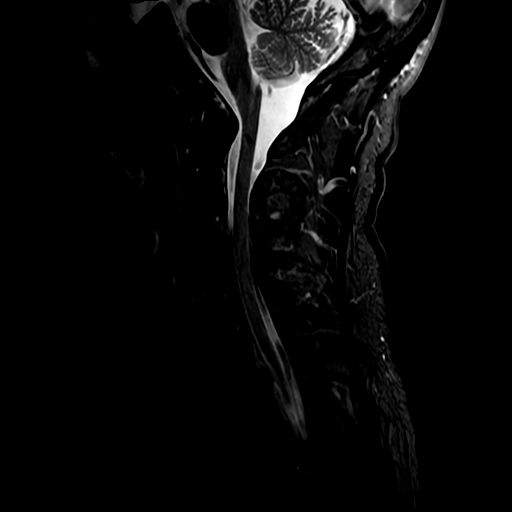
[im 16/16]
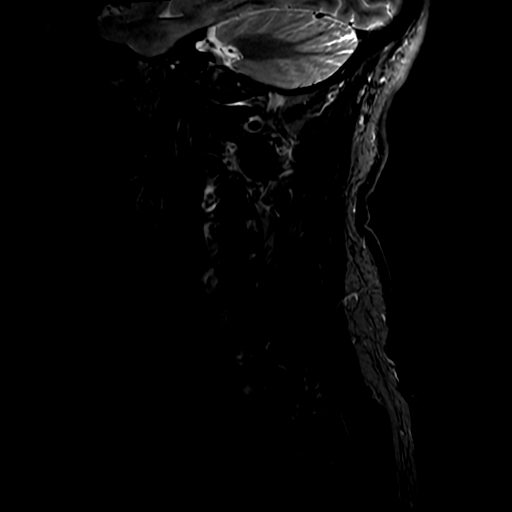

[Series 7: T2 · axial · 3.0mm · 0.35mm/px · z∈[-100,-14]mm · 9 of 30 slices shown (2 of 2)]
[im 1/30]
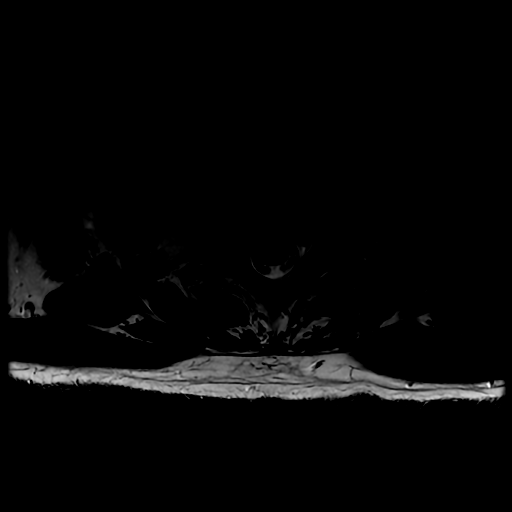
[im 4/30]
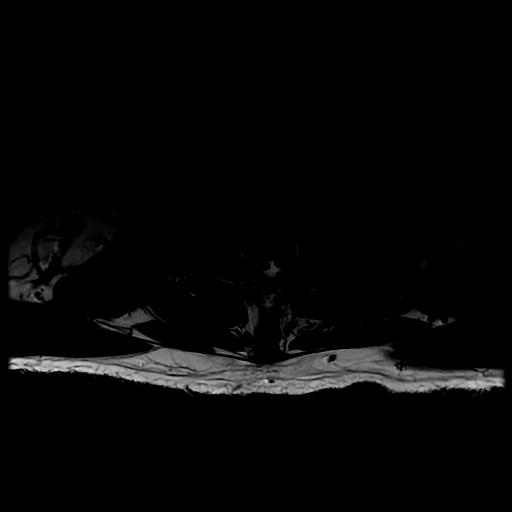
[im 8/30]
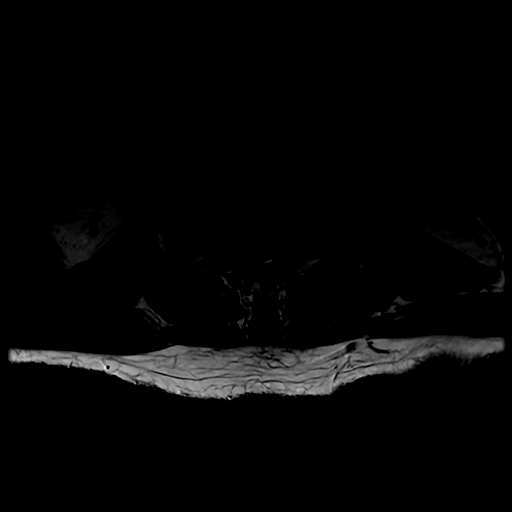
[im 11/30]
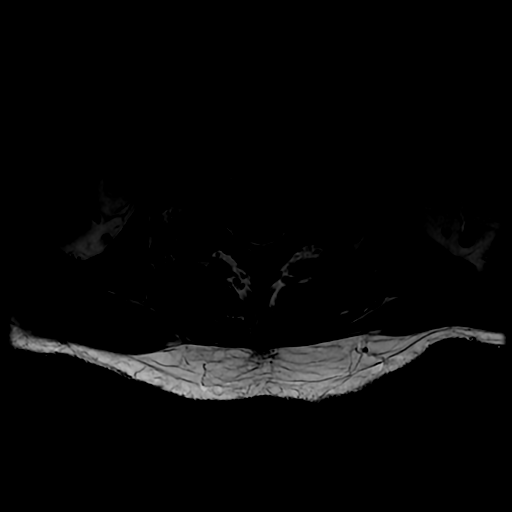
[im 15/30]
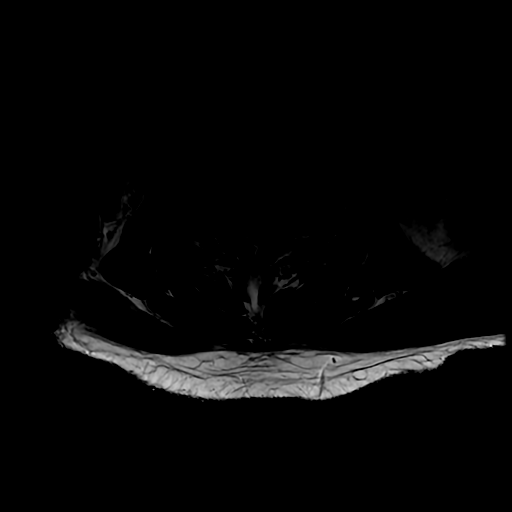
[im 19/30]
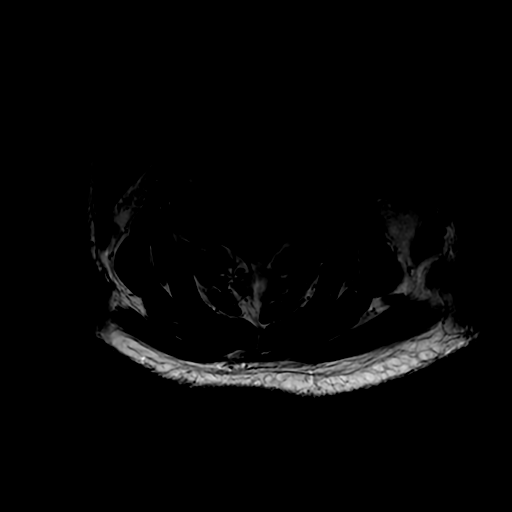
[im 22/30]
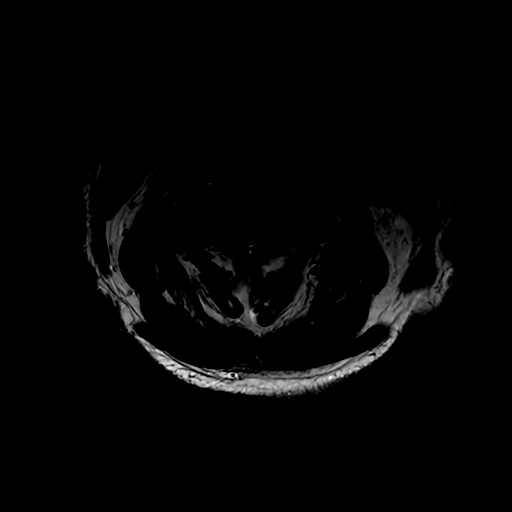
[im 26/30]
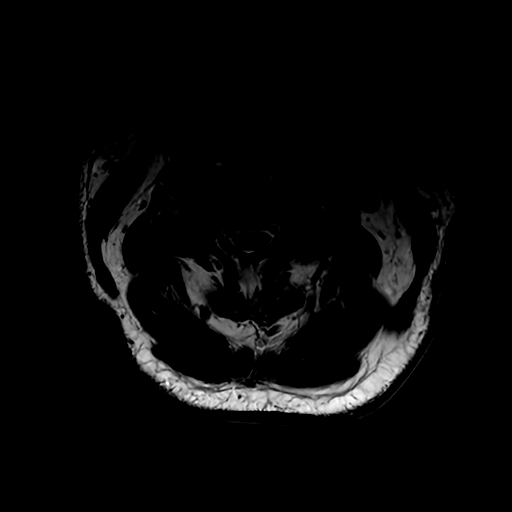
[im 30/30]
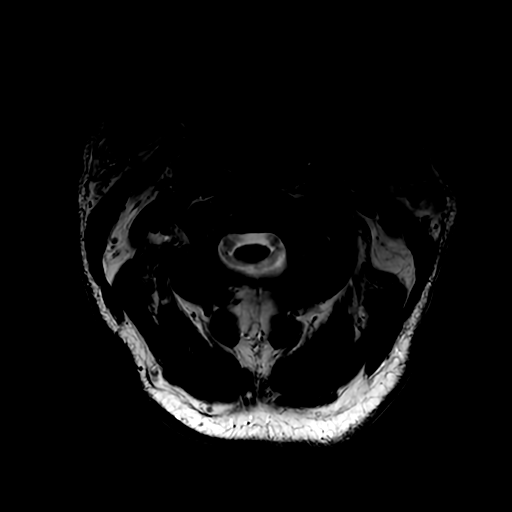

[19 of 48 positions shown; findings below may reference images not displayed]

FINDINGS: Alignment: Physiologic.

Vertebrae: No fracture, evidence of discitis, or bone lesion.

Cord: Normal signal and morphology.

Posterior Fossa, vertebral arteries, paraspinal tissues: Negative.

Disc levels:

C2-3: Degenerative facet spurring which is moderate on the left.
Mild left foraminal narrowing

C3-4: Degenerative facet spurring which is moderate to bulky on the
right. Asymmetric right uncovertebral ridging. Right foraminal
impingement and mild left foraminal narrowing

C4-5: Degenerative facet spurring which is bulky on the left. Small
left paracentral protrusion indenting the left ventral cord. Left
foraminal impingement

C5-6: Degenerative facet spurring which is moderate on the right.
Asymmetric right uncovertebral ridging. Mild or moderate right
foraminal narrowing.

C6-7: Disc narrowing and bulging with mild facet spurring. Moderate
right foraminal narrowing

C7-T1:Right foraminal extrusion with prominent impingement.
Degenerative facet spurring with mild left foraminal stenosis.
IMPRESSION: 1. C7-T1 right foraminal extrusion with marked C8 impingement.
2. Disc narrowing and degenerative ridging cause less pronounced
foraminal impingement on the symptomatic right side at C3-4 and
C6-7.
3. Degenerative left foraminal impingement at C4-5.
4. Diffusely patent spinal canal. A small left paracentral
protrusion at C4-5 indents the left ventral cord.

## 2022-07-05 ENCOUNTER — Encounter (HOSPITAL_BASED_OUTPATIENT_CLINIC_OR_DEPARTMENT_OTHER): Payer: Self-pay | Admitting: Family

## 2022-07-05 ENCOUNTER — Ambulatory Visit (INDEPENDENT_AMBULATORY_CARE_PROVIDER_SITE_OTHER): Payer: Medicare Other | Admitting: Family

## 2022-07-05 VITALS — BP 148/82 | HR 52 | Ht 67.0 in | Wt 164.0 lb

## 2022-07-05 DIAGNOSIS — E785 Hyperlipidemia, unspecified: Secondary | ICD-10-CM

## 2022-07-05 DIAGNOSIS — R000 Tachycardia, unspecified: Secondary | ICD-10-CM

## 2022-07-05 DIAGNOSIS — R011 Cardiac murmur, unspecified: Secondary | ICD-10-CM | POA: Diagnosis not present

## 2022-07-05 DIAGNOSIS — I25118 Atherosclerotic heart disease of native coronary artery with other forms of angina pectoris: Secondary | ICD-10-CM

## 2022-07-05 DIAGNOSIS — I10 Essential (primary) hypertension: Secondary | ICD-10-CM

## 2022-07-05 DIAGNOSIS — R55 Syncope and collapse: Secondary | ICD-10-CM

## 2022-07-05 NOTE — Progress Notes (Signed)
Office Visit    Patient Name: Dale [name] Date of Encounter: 07/05/2022  PCP:  [name], MD   [employer]  Cardiologist:  [name], MD  Advanced Practice Provider:  No care team member to display Electrophysiologist:  None      Chief Complaint    Dale [name] is a 76 y.o. male presents today for concerns regarding heart rate during exercise  Past Medical History    Past Medical History:  Diagnosis Date   Allergic rhinitis    Anxiety    BPH (benign prostatic hyperplasia)    DDD (degenerative disc disease), cervical    Depression    ED (erectile dysfunction)    Exertional angina [facility])    cardiologist-  [name]---  04-21-2013 abnormal myoview with epicardial coronaries ;  per cardiac cath 05-01-2013 no sig. obstructive CAD (LAD calcification, diffuse plaqueing) ;  evidence dRCA and LV branch vasoconstriction   GERD (gastroesophageal reflux disease)    Gout 06/2020   recent episode a few weeks ago   History of adenomatous polyp of colon    History of basal cell carcinoma (BCC) excision    History of blood transfusion    History of squamous cell carcinoma excision    09/ 27/ 2019  right lower leg    HTN (hypertension)    Hypercholesteremia    Hypogonadism in male    OA (osteoarthritis)    knees, lumbar, fingers   Seasonal allergies    Skin cancer, basal cell    Systolic murmur    Past Surgical History:  Procedure Laterality Date   APPENDECTOMY  age 89   CARDIAC CATHETERIZATION     no stent   CATARACT EXTRACTION W/ INTRAOCULAR LENS  IMPLANT, BILATERAL  07/2018   COLONOSCOPY N/A 03/02/2014   Procedure: COLONOSCOPY;  Surgeon: [name], MD;  Location: [facility];  Service: Endoscopy;  Laterality: N/A;   CYSTOSCOPY WITH INSERTION OF UROLIFT N/A 07/04/2020   Procedure: CYSTOSCOPY WITH INSERTION OF UROLIFT;  Surgeon: Hollice Espy, MD;  Location: [facility];  Service: Urology;  Laterality: N/A;    CYSTOSCOPY WITH URETHRAL DILATATION N/A 06/27/2020   Procedure: CYSTOSCOPY WITH URETHRAL DILATATION;  Surgeon: Hollice Espy, MD;  Location: [facility];  Service: Urology;  Laterality: N/A;   EYE SURGERY Bilateral    cataract    FINGER SURGERY     11-14-2004 right long finger;  12-19-2007 left index finger (both done by dr sypher)   HOT HEMOSTASIS N/A 03/02/2014   Procedure: HOT HEMOSTASIS (ARGON PLASMA COAGULATION/BICAP);  Surgeon: [name], MD;  Location: [facility];  Service: Endoscopy;  Laterality: N/A;   INGUINAL HERNIA REPAIR Bilateral left , early 2000s;  right 12/ 2003   inguinal hernia, with mesh   JOINT REPLACEMENT     KNEE ARTHROSCOPY Bilateral right- last one 1996;  left , last one ,yrs ago   [place]   open   LEFT HEART CATHETERIZATION WITH CORONARY ANGIOGRAM N/A 05/01/2013   Procedure: LEFT HEART CATHETERIZATION WITH CORONARY ANGIOGRAM;  Surgeon: [name], MD;  Location: [facility] CATH LAB;  Service: Cardiovascular;  Laterality: N/A;   MOHS SURGERY  yrs ago   nose   RECONSTRUCTION OF NOSE  1968   TOTAL KNEE ARTHROPLASTY Left 09/16/2018   Procedure: LEFT TOTAL KNEE ARTHROPLASTY;  Surgeon: [name], MD;  Location: [facility];  Service: Orthopedics;  Laterality: Left;  70 mins   TOTAL  KNEE ARTHROPLASTY Right 04/28/2019   Procedure: TOTAL KNEE ARTHROPLASTY;  Surgeon: [name], MD;  Location: [facility];  Service: Orthopedics;  Laterality: Right;  70 mins    Allergies  Allergies  Allergen Reactions   Latex Itching    Redness. Has never had RAST testing. Bandaids/ dressings causes the itching and redness.   Penicillins Itching and Rash    Fever. TOLERATED CEPHALEXIN. Has patient had a PCN reaction causing immediate rash, facial/tongue/throat swelling, SOB or lightheadedness with hypotension: No Has patient had a PCN reaction causing severe rash involving mucus membranes or skin necrosis: No Has patient had a PCN reaction that required  hospitalization: No Has patient had a PCN reaction occurring within the last 10 years: No If all of the above answers are "NO", then may proceed with Cephalosporin use.    Pneumococcal Vaccines Swelling, Rash and Other (See Comments)    Low grade fever. Puffy at sight of injection    History of Present Illness    Dale [name] is a 76 y.o. male with a hx of coronary artery calcification by CT, aortic atherosclerosis, systolic murmur, hypertension, hyperlipidemia, bradycardia last seen 09/20/2021 by Dr. Tamala Julian.  Prior abnormal nuclear study with inferolateral and apical ischemia and cardiac catheterization with nonobstructive epicardial coronaries. Coronary spasm documented by catheterization. Has been maintained on BB since 2015 due to PVC.   Was seen 05/2021 for syncope.  Lisinopril-HCTZ discontinued.  Monitor revealed predominantly NSR, 10% burden PVC, 1 instance of accelerated idioventricular rhythm which was asymptomatic, rare SVT up to 5 beats.  There was evidence of sinus node dysfunction and this was recommended for monitoring.  Lisinopril just HCTZ was resumed 07/2021 due to hypertension.  He was last seen 09/2021 doing overall well from a cardiac perspective and no changes were made at that time.  CT chest 12/2021 did reveal mild calcification of the aortic arch and moderate to marked severity of coronary artery calcification.  He presents today for follow-up independently. Enjoys walking and playing pickleball. He did cut out some walking due to achilles injury and prior to that had to reduce walking due to pinched nerve in neck. He is back to exercising, particularly playing pickleball. Previous HR with exercise 130s. Now escalating into 140s-150s associated with feeling across his chest from right to left and feels his heart racing. No chest pain nor significant dyspnea. Blood pressure athome has been "okay" per his report.   EKGs/Labs/Other Studies Reviewed:   The following  studies were reviewed today:  Prolonged Monitor 06/13/2021: Study Highlights   Addendum by Belva Crome, MD on Tue Jun 20, 2021  9:58 AM   Basic underlying rhythm is normal sinus rhythm and sinus bradycardia with an average heart rate of 58 bpm. Relatively high burden of isolated premature ventricular contractions, 10% Accelerated idioventricular rhythm, asymptomatic. There was 1 instance. No Atrial fibrillation. Rare salvos of SVT up to 5 beats.  EKG:  EKG is ordered today.  The ekg ordered today demonstrates SB 52 with first degree AV block PR 220 ms.   Recent Labs: 10/20/2021: ALT 24  Recent Lipid Panel    Component Value Date/Time   CHOL 129 10/20/2021 1125   TRIG 126 10/20/2021 1125   HDL 39 (L) 10/20/2021 1125   CHOLHDL 3.3 10/20/2021 1125   LDLCALC 67 10/20/2021 1125   Home Medications   Current Meds  Medication Sig   allopurinol (ZYLOPRIM) 300 MG tablet Take 300 mg by mouth daily.   aspirin EC 81  MG tablet Take 81 mg by mouth daily. Swallow whole.   B-D 3CC LUER-LOK SYR 22GX1" 22G X 1" 3 ML MISC See admin instructions.   Calcium Carbonate Antacid (TUMS E-X PO) Take 2 tablets by mouth daily as needed (heartburn).    cholecalciferol (VITAMIN D) 1000 units tablet Take 1,000 Units by mouth daily.    colchicine 0.6 MG tablet Take 0.6-1.2 mg by mouth See admin instructions. Take 2 tablets at onset of gout attack then 1 tablet daily as needed for pain   diclofenac Sodium (VOLTAREN) 1 % GEL Apply topically.   fluticasone (FLONASE) 50 MCG/ACT nasal spray Place 1 spray into the nose daily as needed for rhinitis or allergies.   hydrocortisone cream 1 % Apply 1 application topically daily as needed for itching.    ibuprofen (ADVIL) 200 MG tablet Take 400 mg by mouth every 6 (six) hours as needed for moderate pain.   lisinopril-hydrochlorothiazide (ZESTORETIC) 10-12.5 MG tablet TAKE 1 TABLET BY MOUTH EVERY DAY (Patient taking differently: Take 0.5 tablets by mouth daily.)    metaxalone (SKELAXIN) 800 MG tablet Take 1 tablet by mouth as needed.   methocarbamol (ROBAXIN) 500 MG tablet Take 1 tablet (500 mg total) by mouth every 6 (six) hours as needed for muscle spasms.   metoprolol succinate (TOPROL-XL) 50 MG 24 hr tablet TAKE 1 TABLET (50 MG TOTAL) BY MOUTH DAILY.   pantoprazole (PROTONIX) 40 MG tablet Take 40 mg by mouth daily as needed (heartburn).    rosuvastatin (CRESTOR) 5 MG tablet Take 1 tablet (5 mg total) by mouth daily.   tadalafil (CIALIS) 20 MG tablet Take 1 tablet by mouth as needed.   tamsulosin (FLOMAX) 0.4 MG CAPS capsule Take 0.4 mg by mouth every other day.   testosterone cypionate (DEPOTESTOSTERONE CYPIONATE) 200 MG/ML injection Inject 100 mg into the muscle once a week.     Review of Systems      All other systems reviewed and are otherwise negative except as noted above.  Physical Exam    VS:  BP (!) 148/82 (BP Location: Left Arm, Patient Position: Sitting, Cuff Size: Normal)   Pulse (!) 52   Ht '5\' 7"'$  (1.702 m)   Wt 164 lb (74.4 kg)   BMI 25.69 kg/m  , BMI Body mass index is 25.69 kg/m.  Wt Readings from Last 3 Encounters:  07/05/22 164 lb (74.4 kg)  09/20/21 154 lb (69.9 kg)  05/12/21 155 lb (70.3 kg)    GEN: Well nourished, well developed, in no acute distress. HEENT: normal. Neck: Supple, no JVD, carotid bruits, or masses. Cardiac: RRR, no  rubs, or gallops. Gr 2/6 murmur RUSB. No clubbing, cyanosis, edema.  Radials/PT 2+ and equal bilaterally.  Respiratory:  Respirations regular and unlabored, clear to auscultation bilaterally. GI: Soft, nontender, nondistended. MS: No deformity or atrophy. Skin: Warm and dry, no rash. Neuro:  Strength and sensation are intact. Psych: Normal affect.  Assessment & Plan    Tachycardia - Notes HR increasing to 140s-150s with exercise associated with sensation of heart racing and sensation across his chest. No chest pain, exertional dyspnea. Anticipate tachycardia is due to him recently  resuming more intense exercise (pickleball) after previous injury. To rule out arrhythmia or ischemia, plan for exercise tolerance test.   Bradycardia - Asymptomatic no lightheadedness, dizziness. Continue present dose Toprol for PVC suppression. No evidence of chronotropic incompetence as HR escalating with exercise.   Murmur - No recent echo. No recurrent syncope. Consider echo at f/u.  Coronary artery calcification by CT -prior abnormal nuclear study with inferolateral and apical ischemia and cardiac catheterization with nonobstructive epicardial coronaries.  Syncope -  Monitor with no significant pause.  Episode was post micturition and possibly vasovagal. No recurrence.   Hypertension - Elevated in clinic but reports well controlled at home. Continue Lisinopril-HCTZ 10-12.'5mg'$  QD, Toprol '50mg'$  QD. Discussed to monitor BP at home at least 2 hours after medications and sitting for 5-10 minutes. MyChart message in 2 weeks to reassess BP. If consistently >130/80 at home, plan to increase to 20-12.'5mg'$  dose. Heart healthy diet and regular cardiovascular exercise encouraged.  Hesitant to increase dose today due to previous syncope and hypotension.   Hyperlipidemia, LDL goal less than 70 -10/2021 LDL 67.  Continue rosuvastatin 5 mg daily.     Disposition: Follow up in October as scheduled  with [name], MD or APP.  Signed, Loel Dubonnet, NP 07/05/2022, 2:50 PM [facility]

## 2022-07-05 NOTE — Patient Instructions (Signed)
Medication Instructions:   No Changes  *If you need a refill on your cardiac medications before your next appointment, please call your pharmacy*   Lab Work: None  If you have labs (blood work) drawn today and your tests are completely normal, you will receive your results only by: [place] (if you have MyChart) OR A paper copy in the mail If you have any lab test that is abnormal or we need to change your treatment, we will call you to review the results.   Testing/Procedures: Your physician has requested that you have an exercise tolerance test. For further information please visit [email or web address]. Please also follow instruction sheet, as given.  Follow-Up: At [facility], you and your health needs are our priority.  As part of our continuing mission to provide you with exceptional heart care, we have created designated Provider Care Teams.  These Care Teams include your primary Cardiologist (physician) and Advanced Practice Providers (APPs -  Physician Assistants and Nurse Practitioners) who all work together to provide you with the care you need, when you need it.  We recommend signing up for the patient portal called "MyChart".  Sign up information is provided on this After Visit Summary.  MyChart is used to connect with patients for Virtual Visits (Telemedicine).  Patients are able to view lab/test results, encounter notes, upcoming appointments, etc.  Non-urgent messages can be sent to your provider as well.   To learn more about what you can do with MyChart, go to [email or web address].    Your next appointment:   October  The format for your next appointment:   In Person  Provider:   Dr. Tamala Julian   Other Instructions Please review instructions for your stress test.  Important Information About Sugar

## 2022-07-06 ENCOUNTER — Telehealth (HOSPITAL_COMMUNITY): Payer: Self-pay | Admitting: *Deleted

## 2022-07-06 NOTE — Telephone Encounter (Signed)
Close encounter 

## 2022-07-10 NOTE — Addendum Note (Signed)
Addended by: Loel Dubonnet on: 07/10/2022 07:44 AM   Modules accepted: Orders

## 2022-07-11 ENCOUNTER — Ambulatory Visit (HOSPITAL_COMMUNITY)
Admission: RE | Admit: 2022-07-11 | Discharge: 2022-07-11 | Disposition: A | Discharge status: Home / Self Care | Payer: Medicare Other | Source: Ambulatory Visit | Attending: Cardiovascular Disease | Admitting: Cardiovascular Disease

## 2022-07-11 DIAGNOSIS — R000 Tachycardia, unspecified: Secondary | ICD-10-CM | POA: Insufficient documentation

## 2022-07-11 DIAGNOSIS — I25118 Atherosclerotic heart disease of native coronary artery with other forms of angina pectoris: Secondary | ICD-10-CM | POA: Diagnosis present

## 2022-07-11 LAB — EXERCISE TOLERANCE TEST
Estimated workload: 12.1
Exercise duration (min): 10 min
Exercise duration (sec): 16 s
MPHR: 146 {beats}/min
Peak HR: 144 {beats}/min
Percent HR: 98 %
Rest HR: 67 {beats}/min

## 2022-09-09 NOTE — Progress Notes (Signed)
Cardiology Office Note:    Date:  09/13/2022   ID:  JARETT DRALLE, DOB 11-11-1946, MRN [id number]  PCP:  [name], MD  Cardiologist:  [name], MD   Referring MD: [name], MD   Chief Complaint  Patient presents with   Hypertension   Coronary Artery Disease   Irregular Heart Beat    History of Present Illness:    Dale [name] is a 76 y.o. male with a hx of angina pectoris with abnormal nuclear study with inferolateral and apical ischemia, nonobstructive  epicardial coronaries, systolic murmur,  hypertension, hyperlipidemia, and resting bradycardia.    He is doing well.  Denies angina.  No sustained tachycardia.  No lightheadedness or dizziness.  Past Medical History:  Diagnosis Date   Allergic rhinitis    Anxiety    BPH (benign prostatic hyperplasia)    DDD (degenerative disc disease), cervical    Depression    ED (erectile dysfunction)    Exertional angina    cardiologist-  [name]---  04-21-2013 abnormal myoview with epicardial coronaries ;  per cardiac cath 05-01-2013 no sig. obstructive CAD (LAD calcification, diffuse plaqueing) ;  evidence dRCA and LV branch vasoconstriction   GERD (gastroesophageal reflux disease)    Gout 06/2020   recent episode a few weeks ago   History of adenomatous polyp of colon    History of basal cell carcinoma (BCC) excision    History of blood transfusion    History of squamous cell carcinoma excision    09/ 27/ 2019  right lower leg    HTN (hypertension)    Hypercholesteremia    Hypogonadism in male    OA (osteoarthritis)    knees, lumbar, fingers   Seasonal allergies    Skin cancer, basal cell    Systolic murmur     Past Surgical History:  Procedure Laterality Date   APPENDECTOMY  age 52   CARDIAC CATHETERIZATION     no stent   CATARACT EXTRACTION W/ INTRAOCULAR LENS  IMPLANT, BILATERAL  07/2018   COLONOSCOPY N/A 03/02/2014   Procedure: COLONOSCOPY;  Surgeon: [name], MD;  Location: [facility];  Service: Endoscopy;  Laterality: N/A;   CYSTOSCOPY WITH INSERTION OF UROLIFT N/A 07/04/2020   Procedure: CYSTOSCOPY WITH INSERTION OF UROLIFT;  Surgeon: Hollice Espy, MD;  Location: [facility];  Service: Urology;  Laterality: N/A;   CYSTOSCOPY WITH URETHRAL DILATATION N/A 06/27/2020   Procedure: CYSTOSCOPY WITH URETHRAL DILATATION;  Surgeon: Hollice Espy, MD;  Location: [facility];  Service: Urology;  Laterality: N/A;   EYE SURGERY Bilateral    cataract    FINGER SURGERY     11-14-2004 right long finger;  12-19-2007 left index finger (both done by dr sypher)   HOT HEMOSTASIS N/A 03/02/2014   Procedure: HOT HEMOSTASIS (ARGON PLASMA COAGULATION/BICAP);  Surgeon: [name], MD;  Location: [facility];  Service: Endoscopy;  Laterality: N/A;   INGUINAL HERNIA REPAIR Bilateral left , early 2000s;  right 12/ 2003   inguinal hernia, with mesh   JOINT REPLACEMENT     KNEE ARTHROSCOPY Bilateral right- last one 1996;  left , last one ,yrs ago   [place]   open   LEFT HEART CATHETERIZATION WITH CORONARY ANGIOGRAM N/A 05/01/2013   Procedure: LEFT HEART CATHETERIZATION WITH CORONARY ANGIOGRAM;  Surgeon: [name], MD;  Location: [facility] C[facility] LAB;  Service: Cardiovascular;  Laterality: N/A;   MOHS SURGERY  yrs ago  nose   RECONSTRUCTION OF NOSE  1968   TOTAL KNEE ARTHROPLASTY Left 09/16/2018   Procedure: LEFT TOTAL KNEE ARTHROPLASTY;  Surgeon: [name], MD;  Location: [facility];  Service: Orthopedics;  Laterality: Left;  70 mins   TOTAL KNEE ARTHROPLASTY Right 04/28/2019   Procedure: TOTAL KNEE ARTHROPLASTY;  Surgeon: [name], MD;  Location: [facility];  Service: Orthopedics;  Laterality: Right;  70 mins    Current Medications: Current Meds  Medication Sig   allopurinol (ZYLOPRIM) 300 MG tablet Take 300 mg by mouth daily.   aspirin EC 81 MG tablet Take 81 mg by mouth daily. Swallow whole.   B-D 3CC LUER-LOK SYR 22GX1" 22G X 1" 3  ML MISC See admin instructions.   Baclofen 5 MG TABS Take 1 tablet by mouth as needed.   Calcium Carbonate Antacid (TUMS E-X PO) Take 2 tablets by mouth daily as needed (heartburn).    cholecalciferol (VITAMIN D) 1000 units tablet Take 1,000 Units by mouth daily.    colchicine 0.6 MG tablet Take 0.6-1.2 mg by mouth See admin instructions. Take 2 tablets at onset of gout attack then 1 tablet daily as needed for pain   diclofenac Sodium (VOLTAREN) 1 % GEL Apply topically.   fluticasone (FLONASE) 50 MCG/ACT nasal spray Place 1 spray into the nose daily as needed for rhinitis or allergies.   gabapentin (NEURONTIN) 300 MG capsule Take 1 capsule by mouth daily at 6 (six) AM.   hydrocortisone cream 1 % Apply 1 application topically daily as needed for itching.    ibuprofen (ADVIL) 200 MG tablet Take 400 mg by mouth every 6 (six) hours as needed for moderate pain.   lisinopril-hydrochlorothiazide (ZESTORETIC) 10-12.5 MG tablet TAKE 1 TABLET BY MOUTH EVERY DAY (Patient taking differently: Take 0.5 tablets by mouth daily.)   metaxalone (SKELAXIN) 800 MG tablet Take 1 tablet by mouth as needed.   methocarbamol (ROBAXIN) 500 MG tablet Take 1 tablet (500 mg total) by mouth every 6 (six) hours as needed for muscle spasms.   metoprolol succinate (TOPROL-XL) 50 MG 24 hr tablet TAKE 1 TABLET (50 MG TOTAL) BY MOUTH DAILY.   neomycin-polymyxin b-dexamethasone (MAXITROL) 3.5-10000-0.1 SUSP Place into both eyes in the morning, at noon, in the evening, and at bedtime.   pantoprazole (PROTONIX) 40 MG tablet Take 40 mg by mouth daily as needed (heartburn).    rosuvastatin (CRESTOR) 5 MG tablet Take 1 tablet (5 mg total) by mouth daily.   tadalafil (CIALIS) 20 MG tablet Take 1 tablet by mouth as needed.   tamsulosin (FLOMAX) 0.4 MG CAPS capsule Take 0.4 mg by mouth every other day.   testosterone cypionate (DEPOTESTOSTERONE CYPIONATE) 200 MG/ML injection Inject 100 mg into the muscle once a week.      Allergies:    Latex, Penicillins, and Pneumococcal vaccines   Social History   Socioeconomic History   Marital status: Married    Spouse name: Joy   Number of children: 2   Years of education: Not on file   Highest education level: Not on file  Occupational History   Occupation: Teacher, music    Comment: retired  Tobacco Use   Smoking status: Never   Smokeless tobacco: Never  Vaping Use   Vaping Use: Never used  Substance and Sexual Activity   Alcohol use: No   Drug use: Never   Sexual activity: Not on file  Other Topics Concern   Not on file  Social History Narrative   Patient lives  with wife.    Social Determinants of Health   Financial Resource Strain: Not on file  Food Insecurity: Not on file  Transportation Needs: Not on file  Physical Activity: Not on file  Stress: Not on file  Social Connections: Not on file     Family History: The patient's family history includes Heart disease in his father; Hypertension in his mother; Stroke in his mother.  ROS:   Please see the history of present illness.    No complaints.  Asked for advice concerning who is future cardiologist would be.  All other systems reviewed and are negative.  EKGs/Labs/Other Studies Reviewed:    The following studies were reviewed today:  2022 Prolonged Monitor 06/2021: Addendum by Belva Crome, MD on Tue Jun 20, 2021  9:58 AM   Basic underlying rhythm is normal sinus rhythm and sinus bradycardia with an average heart rate of 58 bpm. Relatively high burden of isolated premature ventricular contractions, 10% Accelerated idioventricular rhythm, asymptomatic. There was 1 instance. No Atrial fibrillation. Rare salvos of SVT up to 5 beats.    ETT 07/13/2022: Study Highlights      Excellent exercise capacity, achieved 12.1 METS   Peak heart rate 144 bpm (98% max age-predicted heart rate)   Hypertensive response to exercise, peak blood pressure 206/101 mmHg   Frequent PVCs, at rest and during  stress/recovery. PVC frequency decreased with increased HR.   up sloping ST depression was noted.  No evidence of ischemia  EKG:  EKG not repeated at  Recent Labs: 10/20/2021: ALT 24  Recent Lipid Panel    Component Value Date/Time   CHOL 129 10/20/2021 1125   TRIG 126 10/20/2021 1125   HDL 39 (L) 10/20/2021 1125   CHOLHDL 3.3 10/20/2021 1125   LDLCALC 67 10/20/2021 1125    Physical Exam:    VS:  BP 118/68   Pulse (!) 52   Ht '5\' 7"'$  (1.702 m)   Wt 166 lb 12.8 oz (75.7 kg)   SpO2 95%   BMI 26.12 kg/m     Wt Readings from Last 3 Encounters:  09/13/22 166 lb 12.8 oz (75.7 kg)  07/05/22 164 lb (74.4 kg)  09/20/21 154 lb (69.9 kg)     GEN: Normal. No acute distress HEENT: Normal NECK: No JVD. LYMPHATICS: No lymphadenopathy CARDIAC: No murmur. RRR no gallop, or edema. VASCULAR:  Normal Pulses. No bruits. RESPIRATORY:  Clear to auscultation without rales, wheezing or rhonchi  ABDOMEN: Soft, non-tender, non-distended, No pulsatile mass, MUSCULOSKELETAL: No deformity  SKIN: Warm and dry NEUROLOGIC:  Alert and oriented x 3 PSYCHIATRIC:  Normal affect   ASSESSMENT:    1. Coronary artery disease of native artery of native heart with stable angina pectoris ([place])   2. PVC (premature ventricular contraction)   3. Hyperlipidemia LDL goal <70   4. Tachycardia   5. Systolic murmur   6. Syncope and collapse   7. Essential hypertension    PLAN:    In order of problems listed above:  Stable without angina.  Nonischemic exercise treadmill test. High burden. May need EP referral. Needs an echocardiogram to r/o LV dysfunction.  Observation for now Continue statin therapy No issues noted with above work-up done earlier this year. Not significant on today's exam No recurrence Excellent BP control.  Expressed gratitude for my care of both his wife and himself over the years.  They prefer to be followed up at [facility].   Medication Adjustments/Labs and Tests  Ordered: Current  medicines are reviewed at length with the patient today.  Concerns regarding medicines are outlined above.  No orders of the defined types were placed in this encounter.  No orders of the defined types were placed in this encounter.   Patient Instructions  Medication Instructions:  Your physician recommends that you continue on your current medications as directed. Please refer to the Current Medication list given to you today.  *If you need a refill on your cardiac medications before your next appointment, please call your pharmacy*  Lab Work: NONE  Testing/Procedures: NONE  Follow-Up: At [facility], you and your health needs are our priority.  As part of our continuing mission to provide you with exceptional heart care, we have created designated Provider Care Teams.  These Care Teams include your primary Cardiologist (physician) and Advanced Practice Providers (APPs -  Physician Assistants and Nurse Practitioners) who all work together to provide you with the care you need, when you need it.  Your next appointment:   1 year(s)  The format for your next appointment:   In Person  Provider:   Buford [facility]er, MD  Important Information About Sugar         Signed, [name], MD  09/13/2022 9:17 AM    [place]

## 2022-09-13 ENCOUNTER — Encounter: Payer: Self-pay | Admitting: Interventional Cardiology

## 2022-09-13 ENCOUNTER — Ambulatory Visit: Payer: Medicare Other | Attending: Interventional Cardiology | Admitting: Interventional Cardiology

## 2022-09-13 VITALS — BP 118/68 | HR 52 | Ht 67.0 in | Wt 166.8 lb

## 2022-09-13 DIAGNOSIS — R000 Tachycardia, unspecified: Secondary | ICD-10-CM | POA: Diagnosis not present

## 2022-09-13 DIAGNOSIS — I25118 Atherosclerotic heart disease of native coronary artery with other forms of angina pectoris: Secondary | ICD-10-CM

## 2022-09-13 DIAGNOSIS — I493 Ventricular premature depolarization: Secondary | ICD-10-CM

## 2022-09-13 DIAGNOSIS — I10 Essential (primary) hypertension: Secondary | ICD-10-CM

## 2022-09-13 DIAGNOSIS — R55 Syncope and collapse: Secondary | ICD-10-CM

## 2022-09-13 DIAGNOSIS — E785 Hyperlipidemia, unspecified: Secondary | ICD-10-CM | POA: Diagnosis not present

## 2022-09-13 DIAGNOSIS — R011 Cardiac murmur, unspecified: Secondary | ICD-10-CM

## 2022-09-13 NOTE — Patient Instructions (Addendum)
Medication Instructions:  Your physician recommends that you continue on your current medications as directed. Please refer to the Current Medication list given to you today.  *If you need a refill on your cardiac medications before your next appointment, please call your pharmacy*  Lab Work: NONE  Testing/Procedures: NONE  Follow-Up: At [facility], you and your health needs are our priority.  As part of our continuing mission to provide you with exceptional heart care, we have created designated Provider Care Teams.  These Care Teams include your primary Cardiologist (physician) and Advanced Practice Providers (APPs -  Physician Assistants and Nurse Practitioners) who all work together to provide you with the care you need, when you need it.  Your next appointment:   1 year(s)  The format for your next appointment:   In Person  Provider:   Buford Dresser, MD  Important Information About Sugar

## 2022-09-14 ENCOUNTER — Other Ambulatory Visit: Payer: Self-pay | Admitting: Interventional Cardiology

## 2023-02-25 ENCOUNTER — Encounter (HOSPITAL_BASED_OUTPATIENT_CLINIC_OR_DEPARTMENT_OTHER): Payer: Self-pay

## 2023-02-25 ENCOUNTER — Telehealth: Payer: Self-pay | Admitting: Family

## 2023-02-25 NOTE — Telephone Encounter (Signed)
Pt c/o BP issue: STAT if pt c/o blurred vision, one-sided weakness or slurred speech  1. What are your last 5 BP readings?  Today 86/54 Yesterday 109/57 Yesterday 97/64  2. Are you having any other symptoms (ex. Dizziness, headache, blurred vision, passed out)? You feel "trashed" and "very fuzzy". Can't focus, out of touch. States if he can get in to   3. What is your BP issue? Pt states he has been taking half of his lisinopril for a while now but has stopped taking it today. Wants to know if he needs to cut his metoprolol back as well.    In response to Kaila's questions -  Have you been eating regularly and hydrating? Yes  Are those readings before or after taking your medication? He states his readings are 3 hrs after taking meds.    Are there any certain days of the week or times of day that work best for an appointment? Any time is fine. Preferably sooner the better.

## 2023-02-25 NOTE — Telephone Encounter (Signed)
Recommendation sent back to patient in previous mychart encounter.

## 2023-02-25 NOTE — Telephone Encounter (Signed)
Recommend stop Lisinopril-HCTZ. Check BP prior to Metoprolol - if BP <110/60 take half tablet. If BP >110/60 take whole tablet. Ensure staying well hydrated, making position changes slowly.  follow up in 1-3 weeks with Dr. Harrell Gave or Loel Dubonnet, NP  Loel Dubonnet, NP

## 2023-03-20 ENCOUNTER — Encounter (HOSPITAL_BASED_OUTPATIENT_CLINIC_OR_DEPARTMENT_OTHER): Payer: Self-pay | Admitting: Cardiology

## 2023-03-20 ENCOUNTER — Ambulatory Visit (INDEPENDENT_AMBULATORY_CARE_PROVIDER_SITE_OTHER): Payer: Medicare Other | Admitting: Cardiology

## 2023-03-20 VITALS — BP 129/74 | HR 63 | Ht 67.0 in | Wt 162.3 lb

## 2023-03-20 DIAGNOSIS — E785 Hyperlipidemia, unspecified: Secondary | ICD-10-CM

## 2023-03-20 DIAGNOSIS — I2510 Atherosclerotic heart disease of native coronary artery without angina pectoris: Secondary | ICD-10-CM

## 2023-03-20 DIAGNOSIS — I10 Essential (primary) hypertension: Secondary | ICD-10-CM

## 2023-03-20 DIAGNOSIS — R42 Dizziness and giddiness: Secondary | ICD-10-CM

## 2023-03-20 DIAGNOSIS — I493 Ventricular premature depolarization: Secondary | ICD-10-CM | POA: Diagnosis not present

## 2023-03-20 MED ORDER — METOPROLOL SUCCINATE ER 25 MG PO TB24: 25.0000 mg | ORAL_TABLET | Freq: Every day | ORAL | 3 refills | Status: AC

## 2023-03-20 NOTE — Progress Notes (Signed)
Cardiology Office Note:    Date:  03/20/2023   ID:  D[place] [name], DOB [birth year], MRN [id number]  PCP:  Daisy Floro, MD  Cardiologist:  Jodelle Red, MD  Referring MD: Daisy Floro, MD   CC: establish care/former patient of Dr. Katrinka Blazing  History of Present Illness:    Dale [name] is a 77 y.o. male with a hx of chest pain with abnormal nuclear study with inferolateral and apical ischemia, nonobstructive epicardial coronaries, systolic murmur,  hypertension, hyperlipidemia, and resting bradycardia. He was a previous patient of Dr. Katrinka Blazing. I initially saw him today for the evaluation and management of hypertension.   He complains of weakness spells which tend to occur at 1-2 pm. During these episodes he endorses trouble focusing and feels uncomfortable. He also experiences GERD. He has checked his blood pressure during an episodes and reports his blood pressure was in the 80s systolic.   He has been managing his hypertension with Lisinopril and Metoprolol. He brought his blood pressure log with him which shows his highest reading of 170/85 on 03/15/2023. His pulse tends to consistently be below 60 bpm at rest.   He stays active by going on walks a couple times a week and enjoys playing pickle ball. He has not been playing pickle ball much lately since he is waiting for a new paddle. He wears a watch that monitors his heart rate and notes it has increased to 135 bpm while playing pickle ball.  He has been watching his diet and trying to eat more protein and foods like egg salad. He drinks a bottle of Body Armor every day.   He reports episodes of sharp frontal and temporal pain with associated neck pain beginning about 2-3 weeks ago. He voiced concerns due to his mother having a history of strokes.  He manages his acid reflux by taking TUMS and Protonix at night time.   He denies any palpitations, chest pain, shortness of breath, or peripheral edema. No  syncope, orthopnea, or PND.  Past Medical History:  Diagnosis Date   Allergic rhinitis    Anxiety    BPH (benign prostatic hyperplasia)    DDD (degenerative disc disease), cervical    Depression    ED (erectile dysfunction)    Exertional angina    cardiologist-  dr Verdis Prime---  04-21-2013 abnormal myoview with epicardial coronaries ;  per cardiac cath 05-01-2013 no sig. obstructive CAD (LAD calcification, diffuse plaqueing) ;  evidence dRCA and LV branch vasoconstriction   GERD (gastroesophageal reflux disease)    Gout 06/2020   recent episode a few weeks ago   History of adenomatous polyp of colon    History of basal cell carcinoma (BCC) excision    History of blood transfusion    History of squamous cell carcinoma excision    09/ 27/ 2019  right lower leg    HTN (hypertension)    Hypercholesteremia    Hypogonadism in male    OA (osteoarthritis)    knees, lumbar, fingers   Seasonal allergies    Skin cancer, basal cell    Systolic murmur     Past Surgical History:  Procedure Laterality Date   APPENDECTOMY  age 41   CARDIAC CATHETERIZATION     no stent   CATARACT EXTRACTION W/ INTRAOCULAR LENS  IMPLANT, BILATERAL  07/2018   COLONOSCOPY N/A 03/02/2014   Procedure: COLONOSCOPY;  Surgeon: Shirley Friar, MD;  Location: [facility];  Service: Endoscopy;  Laterality: N/A;  CYSTOSCOPY WITH INSERTION OF UROLIFT N/A 07/04/2020   Procedure: CYSTOSCOPY WITH INSERTION OF UROLIFT;  Surgeon: Vanna Scotland, MD;  Location: [facility];  Service: Urology;  Laterality: N/A;   CYSTOSCOPY WITH URETHRAL DILATATION N/A 06/27/2020   Procedure: CYSTOSCOPY WITH URETHRAL DILATATION;  Surgeon: Vanna Scotland, MD;  Location: [facility];  Service: Urology;  Laterality: N/A;   EYE SURGERY Bilateral    cataract    FINGER SURGERY     11-14-2004 right long finger;  12-19-2007 left index finger (both done by dr sypher)   HOT HEMOSTASIS N/A 03/02/2014   Procedure: HOT HEMOSTASIS (ARGON PLASMA  COAGULATION/BICAP);  Surgeon: Shirley Friar, MD;  Location: [facility];  Service: Endoscopy;  Laterality: N/A;   INGUINAL HERNIA REPAIR Bilateral left , early 2000s;  right 12/ 2003   inguinal hernia, with mesh   JOINT REPLACEMENT     KNEE ARTHROSCOPY Bilateral right- last one 1996;  left , last one ,yrs ago   KNEE SURGERY Left 1972   open   LEFT HEART CATHETERIZATION WITH CORONARY ANGIOGRAM N/A 05/01/2013   Procedure: LEFT HEART CATHETERIZATION WITH CORONARY ANGIOGRAM;  Surgeon: Lesleigh Noe, MD;  Location: [facility];  Service: Cardiovascular;  Laterality: N/A;   MOHS SURGERY  yrs ago   nose   RECONSTRUCTION OF NOSE  1968   TOTAL KNEE ARTHROPLASTY Left 09/16/2018   Procedure: LEFT TOTAL KNEE ARTHROPLASTY;  Surgeon: Durene Romans, MD;  Location: [facility];  Service: Orthopedics;  Laterality: Left;  70 mins   TOTAL KNEE ARTHROPLASTY Right 04/28/2019   Procedure: TOTAL KNEE ARTHROPLASTY;  Surgeon: Durene Romans, MD;  Location: [facility];  Service: Orthopedics;  Laterality: Right;  70 mins    Current Medications: Current Outpatient Medications on File Prior to Visit  Medication Sig   allopurinol (ZYLOPRIM) 300 MG tablet Take 1.5 mg by mouth daily.   aspirin EC 81 MG tablet Take 81 mg by mouth daily. Swallow whole.   B-D 3CC LUER-LOK SYR 22GX1" 22G X 1" 3 ML MISC See admin instructions.   Baclofen 5 MG TABS Take 1 tablet by mouth as needed.   Calcium Carbonate Antacid (TUMS E-X PO) Take 2 tablets by mouth daily as needed (heartburn).    cholecalciferol (VITAMIN D) 1000 units tablet Take 1,000 Units by mouth daily.    diclofenac Sodium (VOLTAREN) 1 % GEL Apply topically.   fluticasone (FLONASE) 50 MCG/ACT nasal spray Place 1 spray into the nose daily as needed for rhinitis or allergies.   hydrocortisone cream 1 % Apply 1 application topically daily as needed for itching.    ibuprofen (ADVIL) 200 MG tablet Take 400 mg by mouth every 6 (six) hours as needed for moderate pain.    metaxalone (SKELAXIN) 800 MG tablet Take 1 tablet by mouth as needed.   neomycin-polymyxin b-dexamethasone (MAXITROL) 3.5-10000-0.1 SUSP Place into both eyes in the morning, at noon, in the evening, and at bedtime.   pantoprazole (PROTONIX) 40 MG tablet Take 40 mg by mouth daily as needed (heartburn).    rosuvastatin (CRESTOR) 5 MG tablet Take 1 tablet (5 mg total) by mouth daily.   tadalafil (CIALIS) 20 MG tablet Take 1 tablet by mouth as needed.   tamsulosin (FLOMAX) 0.4 MG CAPS capsule Take 0.4 mg by mouth daily.   testosterone cypionate (DEPOTESTOSTERONE CYPIONATE) 200 MG/ML injection Inject 100 mg into the muscle once a week.    No current facility-administered medications on file prior to visit.     Allergies:  Latex, Penicillins, and Pneumococcal vaccines   Social History   Tobacco Use   Smoking status: Never   Smokeless tobacco: Never  Vaping Use   Vaping Use: Never used  Substance Use Topics   Alcohol use: No   Drug use: Never    Family History: family history includes Heart disease in his father; Hypertension in his mother; Stroke in his mother.  ROS:   Please see the history of present illness.   (+) Weakness (+) Acid reflux (+) Frontal and temporal pain (+) Neck pain  Additional pertinent ROS otherwise unremarkable  EKGs/Labs/Other Studies Reviewed:    The following studies were reviewed today:  Long term monitor 06/2021: Basic underlying rhythm is normal sinus rhythm and sinus bradycardia with an average heart rate of 58 bpm. Relatively high burden of isolated premature ventricular contractions, 10% Accelerated idioventricular rhythm, asymptomatic. There was 1 instance. Atrial fibrillation. Rare salvos of SVT up to 5 beats.  EKG:  EKG is personally reviewed.   03/20/2023: not ordered today  Recent Labs: No results found for requested labs within last 365 days.  Recent Lipid Panel    Component Value Date/Time   CHOL 129 10/20/2021 1125   TRIG 126  10/20/2021 1125   HDL 39 (L) 10/20/2021 1125   CHOLHDL 3.3 10/20/2021 1125   LDLCALC 67 10/20/2021 1125    Physical Exam:    VS:  BP 129/74 (BP Location: Left Arm, Patient Position: Sitting, Cuff Size: Normal)   Pulse 63   Ht 5\' 7"  (1.702 m)   Wt 162 lb 4.8 oz (73.6 kg)   SpO2 96%   BMI 25.42 kg/m     Wt Readings from Last 3 Encounters:  03/20/23 162 lb 4.8 oz (73.6 kg)  09/13/22 166 lb 12.8 oz (75.7 kg)  07/05/22 164 lb (74.4 kg)    GEN: Well nourished, well developed in no acute distress HEENT: Normal, moist mucous membranes NECK: No JVD CARDIAC: regular rhythm, normal S1 and S2, no rubs or gallops. No murmur. VASCULAR: Radial and DP pulses 2+ bilaterally. No carotid bruits RESPIRATORY:  Clear to auscultation without rales, wheezing or rhonchi  ABDOMEN: Soft, non-tender, non-distended MUSCULOSKELETAL:  Ambulates independently SKIN: Warm and dry, no edema NEUROLOGIC:  Alert and oriented x 3. No focal neuro deficits noted. PSYCHIATRIC:  Normal affect    ASSESSMENT:    1. Nonocclusive coronary atherosclerosis of native coronary artery   2. Hyperlipidemia LDL goal <70   3. PVC (premature ventricular contraction)   4. Essential hypertension   5. Intermittent lightheadedness    PLAN:    Intermittent lightheadedness Hypertension -reviewed home readings, generally well controlled. Would remain off of lisinopril-HCTZ -see below re: metoprolol  PVCs Intermittent bradycardia -he has been on metoprolol succinate 50 mg since at least 2015, reportedly for PVCs and possible exercise-induced vasospasm -he is asymptomatic with his PVCs. Last burden by monitor 10% -reports HR as low at 36, unclear if this is in the presence of PVCs -will cut metoprolol back to 25 mg daily. He will contact me if he feels worse  Hyperlipidemia Nonobstructive CAD -continue rosuvastatin, last LDL 64  Cardiac risk counseling and prevention recommendations: -recommend heart healthy/Mediterranean  diet, with whole grains, fruits, vegetable, fish, lean meats, nuts, and olive oil. Limit salt. -recommend moderate walking, 3-5 times/week for 30-50 minutes each session. Aim for at least 150 minutes.week. Goal should be pace of 3 miles/hours, or walking 1.5 miles in 30 minutes -recommend avoidance of tobacco products. Avoid excess alcohol. -ASCVD  risk score: The ASCVD Risk score (Arnett DK, et al., 2019) failed to calculate for the following reasons:   The valid total cholesterol range is 130 to 320 mg/dL    Plan for follow up: 2 months, or sooner if needed  Jodelle Red, MD, PhD, [facility]  [facility]  [place]  [facility] at [facility] [address] [place], [place] [address] [phone]   Medication Adjustments/Labs and Tests Ordered: Current medicines are reviewed at length with the patient today.  Concerns regarding medicines are outlined above.  No orders of the defined types were placed in this encounter.  Meds ordered this encounter  Medications   metoprolol succinate (TOPROL-XL) 25 MG 24 hr tablet    Sig: Take 1 tablet (25 mg total) by mouth daily.    Dispense:  90 tablet    Refill:  3    Please wait to fill until the patient calls    Patient Instructions  Stay off of the lisinopril-HCTZ. We will decrease the metoprolol to 25 mg daily. Call me with any worsening symptoms.  Follow up 05/30/2023 at 11:40 with Dr Hector Shade Rivera,acting as a scribe for Jodelle Red, MD.,have documented all relevant documentation on the behalf of Jodelle Red, MD,as directed by  Jodelle Red, MD while in the presence of Jodelle Red, MD.  I, Jodelle Red, MD, have reviewed all documentation for this visit. The documentation on 03/20/23 for the exam, diagnosis, procedures, and orders are all accurate and complete.    Total time of encounter: 40  minutes total time of encounter, including 33 minutes spent in face-to-face patient care. This time includes coordination of care and counseling regarding above conditions. Remainder of non-face-to-face time involved reviewing chart documents/testing relevant to the patient encounter and documentation in the medical record.  Jodelle Red, MD, PhD, [facility]  [facility]   Signed, Jodelle Red, MD PhD 03/20/2023 1:53 PM    [facility]

## 2023-03-20 NOTE — Patient Instructions (Addendum)
Stay off of the lisinopril-HCTZ. We will decrease the metoprolol to 25 mg daily. Call me with any worsening symptoms.  Follow up 05/30/2023 at 11:40 with Dr Cristal Deer

## 2023-04-26 ENCOUNTER — Other Ambulatory Visit: Payer: Self-pay | Admitting: Internal Medicine

## 2023-04-26 DIAGNOSIS — E291 Testicular hypofunction: Secondary | ICD-10-CM

## 2023-05-30 ENCOUNTER — Encounter (HOSPITAL_BASED_OUTPATIENT_CLINIC_OR_DEPARTMENT_OTHER): Payer: Self-pay | Admitting: Cardiology

## 2023-05-30 ENCOUNTER — Ambulatory Visit (INDEPENDENT_AMBULATORY_CARE_PROVIDER_SITE_OTHER): Payer: Medicare Other | Admitting: Cardiology

## 2023-05-30 VITALS — BP 142/78 | HR 53 | Ht 67.0 in | Wt 158.0 lb

## 2023-05-30 DIAGNOSIS — I2510 Atherosclerotic heart disease of native coronary artery without angina pectoris: Secondary | ICD-10-CM

## 2023-05-30 DIAGNOSIS — Z01810 Encounter for preprocedural cardiovascular examination: Secondary | ICD-10-CM | POA: Diagnosis not present

## 2023-05-30 DIAGNOSIS — I493 Ventricular premature depolarization: Secondary | ICD-10-CM

## 2023-05-30 DIAGNOSIS — E785 Hyperlipidemia, unspecified: Secondary | ICD-10-CM

## 2023-05-30 DIAGNOSIS — I10 Essential (primary) hypertension: Secondary | ICD-10-CM | POA: Diagnosis not present

## 2023-05-30 NOTE — Patient Instructions (Addendum)
Medication Instructions:  Your physician recommends that you continue on your current medications as directed. Please refer to the Current Medication list given to you today.  *If you need a refill on your cardiac medications before your next appointment, please call your pharmacy*  Lab Work: NONE  Testing/Procedures: NONE  Follow-Up: At [facility], you and your health needs are our priority.  As part of our continuing mission to provide you with exceptional heart care, we have created designated Provider Care Teams.  These Care Teams include your primary Cardiologist (physician) and Advanced Practice Providers (APPs -  Physician Assistants and Nurse Practitioners) who all work together to provide you with the care you need, when you need it.  We recommend signing up for the patient portal called "MyChart".  Sign up information is provided on this After Visit Summary.  MyChart is used to connect with patients for Virtual Visits (Telemedicine).  Patients are able to view lab/test results, encounter notes, upcoming appointments, etc.  Non-urgent messages can be sent to your provider as well.   To learn more about what you can do with MyChart, go to [email or web address].    Your next appointment:   6-8 month(s)  The format for your next appointment:   In Person  Provider:   Jodelle Red, MD or Ronn Melena NP

## 2023-05-30 NOTE — Progress Notes (Signed)
Cardiology Office Note:  .    Date:  05/30/2023  ID:  D[place] [name], DOB [birth year], MRN [id number] PCP: Daisy Floro, MD  [facility] Providers Cardiologist:  Jodelle Red, MD     History of Present Illness: .    Dale [name] is a 77 y.o. male with a hx of chest pain with abnormal nuclear study with inferolateral and apical ischemia, nonobstructive epicardial coronaries, systolic murmur, hypertension, hyperlipidemia, and resting bradycardia. He was a previous patient of Dr. Katrinka Blazing. I initially saw him 03/20/2023 for the evaluation and management of hypertension.    At his initial appointment, he complained of weakness spells during which he had trouble focusing and felt uncomfortable. He had checked his blood pressure at 80s systolic during an episode. He had been managing his hypertension with Lisinopril and Metoprolol. BP log showed a highest reading of 170/85 on 03/15/2023. His pulse was consistently below 60 bpm at rest. He noted some heart rates as low 36, unclear if in the presence of PVCs. We reduced his metoprolol back to 25 mg daily.   Today, he reports that he was recently diagnosed with hyperparathyroidism per his endocrinologist. He has an upcoming appointment with Dr. Gerrit Friends in [place]. Generally, he confirms that he can care for himself, walk 1-2 blocks on level ground, run a short distance, and climb a flight of stairs. He is able to perform light housework, and assist with moderate housework (he feels more limited physically rather than from a cardiovascular perspective).  Has had 3-4 warnings from his watch that he was below 39 bpm, usually when he is inactive, such as sitting in the chair watching TV. Usually normal resting heart rate is in the 50s. He has been tolerating the reduced dose of metoprolol. A few times he has been aware of his palpitations, such as when he is more still and at rest. He doesn't notice palpitations while he is walking.  For exercise he usually goes walking 5 times a week, for 2 to 2.5 miles at a time. In the past 3 weeks he has been unable to play pickleball as usual due to developing left tennis elbow and shoulder issues. His physical therapy begins [place] 15.  His blood pressure is a little elevated today, 150/100 initially and 142/78 on recheck. He states his readings have been more controlled at other clinic visits.  He denies any chest pain, shortness of breath, peripheral edema, headaches, syncope, orthopnea, or PND.  ROS:  Please see the history of present illness. ROS otherwise negative except as noted.  (+) Palpitations (+) Left elbow and shoulder pain  Studies Reviewed: Marland Kitchen    EKG Interpretation Date/Time:  Thursday May 30 2023 11:32:43 EDT Ventricular Rate:  53 PR Interval:  210 QRS Duration:  104 QT Interval:  392 QTC Calculation: 367 R Axis:   45  Text Interpretation: Sinus bradycardia with 1st degree A-V block When compared with ECG of 23-Jun-2020 12:03, No significant change was found Confirmed by Jodelle Red [id number]) on 05/30/2023 11:39:30 AM    Physical Exam:    VS:  BP (!) 142/78 (BP Location: Right Arm, Patient Position: Sitting, Cuff Size: Normal)   Pulse (!) 53   Ht 5\' 7"  (1.702 m)   Wt 158 lb (71.7 kg)   BMI 24.75 kg/m    Wt Readings from Last 3 Encounters:  05/30/23 158 lb (71.7 kg)  03/20/23 162 lb 4.8 oz (73.6 kg)  09/13/22 166 lb 12.8 oz (75.7 kg)  GEN: Well nourished, well developed in no acute distress HEENT: Normal, moist mucous membranes NECK: No JVD CARDIAC: regular rhythm, normal S1 and S2, no rubs or gallops. No murmur. VASCULAR: Radial and DP pulses 2+ bilaterally. No carotid bruits RESPIRATORY:  Clear to auscultation without rales, wheezing or rhonchi  ABDOMEN: Soft, non-tender, non-distended MUSCULOSKELETAL:  Ambulates independently SKIN: Warm and dry, no edema NEUROLOGIC:  Alert and oriented x 3. No focal neuro deficits noted. PSYCHIATRIC:   Normal affect   ASSESSMENT AND PLAN: .    Preoperative cardiovascular evaluation According to the Revised Cardiac Risk Index (RCRI), his Perioperative Risk of Major Cardiac Event is (%): 0.4  His Functional Capacity in METs is: 8.33 according to the [facility] Activity Status Index (DASI).  The patient is not currently having active cardiac symptoms, and they can achieve >4 METs of activity.  According to ACC/AHA Guidelines, no further testing is needed.  Proceed with surgery at acceptable risk.  Our service is available as needed in the peri-operative period.     Intermittent lightheadedness Hypertension -resolved with decreasing metoprolol dose at prior visit   PVCs Intermittent bradycardia -he is asymptomatic with his PVCs. Last burden by monitor 10% -doing well with decreased metoprolol dose   Hyperlipidemia Nonobstructive CAD -continue rosuvastatin  I,Mathew Stumpf,acting as a scribe for [employer], MD.,have documented all relevant documentation on the behalf of Jodelle Red, MD,as directed by  Jodelle Red, MD while in the presence of Jodelle Red, MD.  I, Jodelle Red, MD, have reviewed all documentation for this visit. The documentation on 05/30/23 for the exam, diagnosis, procedures, and orders are all accurate and complete.   Signed, Jodelle Red, MD

## 2023-06-24 ENCOUNTER — Other Ambulatory Visit (HOSPITAL_COMMUNITY): Payer: Self-pay | Admitting: Surgery

## 2023-06-24 DIAGNOSIS — E213 Hyperparathyroidism, unspecified: Secondary | ICD-10-CM

## 2023-06-24 DIAGNOSIS — E210 Primary hyperparathyroidism: Secondary | ICD-10-CM

## 2023-07-03 ENCOUNTER — Encounter (HOSPITAL_COMMUNITY): Payer: Self-pay

## 2023-07-03 ENCOUNTER — Encounter (HOSPITAL_COMMUNITY): Payer: Medicare Other

## 2023-07-03 ENCOUNTER — Ambulatory Visit (HOSPITAL_COMMUNITY): Payer: Medicare Other

## 2023-07-04 ENCOUNTER — Ambulatory Visit (HOSPITAL_COMMUNITY)
Admission: RE | Admit: 2023-07-04 | Discharge: 2023-07-04 | Disposition: A | Discharge status: Home / Self Care | Payer: Medicare Other | Source: Ambulatory Visit | Attending: Surgery | Admitting: Surgery

## 2023-07-04 DIAGNOSIS — E210 Primary hyperparathyroidism: Secondary | ICD-10-CM | POA: Diagnosis present

## 2023-07-08 NOTE — Progress Notes (Signed)
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  Await nuclear med sestamibi results.  tmg  Darnell Level, MD [facility] practice Office: [phone]

## 2023-07-09 ENCOUNTER — Ambulatory Visit (HOSPITAL_COMMUNITY)
Admission: RE | Admit: 2023-07-09 | Discharge: 2023-07-09 | Disposition: A | Discharge status: Home / Self Care | Payer: Medicare Other | Source: Ambulatory Visit | Attending: Surgery | Admitting: Surgery

## 2023-07-09 DIAGNOSIS — E213 Hyperparathyroidism, unspecified: Secondary | ICD-10-CM | POA: Insufficient documentation

## 2023-07-09 MED ORDER — TECHNETIUM TC 99M SESTAMIBI - CARDIOLITE
25.5000 | Freq: Once | INTRAVENOUS | Status: AC | PRN
  Administered 2023-07-09: 25.5 via INTRAVENOUS

## 2023-07-15 ENCOUNTER — Ambulatory Visit: Payer: Self-pay | Admitting: Surgery

## 2023-07-15 NOTE — Progress Notes (Signed)
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  Patient is a good candidate for minimally invasive surgery.  Will plan to proceed with minimally invasive parathyroidectomy as an outpatient procedure as discussed in the office.  I will enter orders and send to schedulers to contact the patient.  Darnell Level, MD [facility] practice Office: [phone]

## 2023-08-08 NOTE — Patient Instructions (Addendum)
SURGICAL WAITING ROOM VISITATION  Patients having surgery or a procedure may have no more than 2 support people in the waiting area - these visitors may rotate.    Children under the age of 50 must have an adult with them who is not the patient.  Due to an increase in RSV and influenza rates and associated hospitalizations, children ages 51 and under may not visit patients in [facility] hospitals.  If the patient needs to stay at the hospital during part of their recovery, the visitor guidelines for inpatient rooms apply. Pre-op nurse will coordinate an appropriate time for 1 support person to accompany patient in pre-op.  This support person may not rotate.    Please refer to the [facility] website for the visitor guidelines for Inpatients (after your surgery is over and you are in a regular room).    Your procedure is scheduled on: 08/19/23   Report to [facility] Main Entrance    Report to admitting at 7:45 AM   Call this number if you have problems the morning of surgery [phone]   Do not eat food :After Midnight.   After Midnight you may have the following liquids until 7:00 AM DAY OF SURGERY  Water Non-Citrus Juices (without pulp, NO RED-Apple, White grape, White cranberry) Black Coffee (NO MILK/CREAM OR CREAMERS, sugar ok)  Clear Tea (NO MILK/CREAM OR CREAMERS, sugar ok) regular and decaf                             Plain Jell-O (NO RED)                                           Fruit ices (not with fruit pulp, NO RED)                                     Popsicles (NO RED)                                                               Sports drinks like Gatorade (NO RED)                      If you have questions, please contact your surgeon's office.   FOLLOW BOWEL PREP AND ANY ADDITIONAL PRE OP INSTRUCTIONS YOU RECEIVED FROM YOUR SURGEON'S OFFICE!!!     Oral Hygiene is also important to reduce your risk of infection.                                     Remember - BRUSH YOUR TEETH THE MORNING OF SURGERY WITH YOUR REGULAR TOOTHPASTE  DENTURES WILL BE REMOVED PRIOR TO SURGERY PLEASE DO NOT APPLY "Poly grip" OR ADHESIVES!!!   Stop all vitamins and herbal supplements 7 days before surgery.   Take these medicines the morning of surgery with A SIP OF WATER: Allopurinol, Clonazepam, Flonase, Metoprolol, Rosuvastatin, Tamsulosin, Tramadol  You may not have any metal on your body including jewelry, and body piercing             Do not wear lotions, powders, cologne, or deodorant              Men may shave face and neck.   Do not bring valuables to the hospital. [place] IS NOT             RESPONSIBLE   FOR VALUABLES.   Contacts, glasses, dentures or bridgework may not be worn into surgery.  DO NOT BRING YOUR HOME MEDICATIONS TO THE HOSPITAL. PHARMACY WILL DISPENSE MEDICATIONS LISTED ON YOUR MEDICATION LIST TO YOU DURING YOUR ADMISSION IN THE HOSPITAL!    Patients discharged on the day of surgery will not be allowed to drive home.  Someone NEEDS to stay with you for the first 24 hours after anesthesia.              Please read over the following fact sheets you were given: IF YOU HAVE QUESTIONS ABOUT YOUR PRE-OP INSTRUCTIONS PLEASE CALL [name]   If you received a COVID test during your pre-op visit  it is requested that you wear a mask when out in public, stay away from anyone that may not be feeling well and notify your surgeon if you develop symptoms. If you test positive for Covid or have been in contact with anyone that has tested positive in the last 10 days please notify you surgeon.    [place] - Preparing for Surgery Before surgery, you can play an important role.  Because skin is not sterile, your skin needs to be as free of germs as possible.  You can reduce the number of germs on your skin by washing with CHG (chlorahexidine gluconate) soap before surgery.  CHG is an antiseptic  cleaner which kills germs and bonds with the skin to continue killing germs even after washing. Please DO NOT use if you have an allergy to CHG or antibacterial soaps.  If your skin becomes reddened/irritated stop using the CHG and inform your nurse when you arrive at Short Stay. Do not shave (including legs and underarms) for at least 48 hours prior to the first CHG shower.  You may shave your face/neck.  Please follow these instructions carefully:  1.  Shower with CHG Soap the night before surgery and the  morning of surgery.  2.  If you choose to wash your hair, wash your hair first as usual with your normal  shampoo.  3.  After you shampoo, rinse your hair and body thoroughly to remove the shampoo.                             4.  Use CHG as you would any other liquid soap.  You can apply chg directly to the skin and wash.  Gently with a scrungie or clean washcloth.  5.  Apply the CHG Soap to your body ONLY FROM THE NECK DOWN.   Do   not use on face/ open                           Wound or open sores. Avoid contact with eyes, ears mouth and   genitals (private parts).                       Wash  face,  Genitals (private parts) with your normal soap.             6.  Wash thoroughly, paying special attention to the area where your    surgery  will be performed.  7.  Thoroughly rinse your body with warm water from the neck down.  8.  DO NOT shower/wash with your normal soap after using and rinsing off the CHG Soap.                9.  Pat yourself dry with a clean towel.            10.  Wear clean pajamas.            11.  Place clean sheets on your bed the night of your first shower and do not  sleep with pets. Day of Surgery : Do not apply any lotions/deodorants the morning of surgery.  Please wear clean clothes to the hospital/surgery center.  FAILURE TO FOLLOW THESE INSTRUCTIONS MAY RESULT IN THE CANCELLATION OF YOUR SURGERY  PATIENT SIGNATURE_________________________________  NURSE  SIGNATURE__________________________________  ________________________________________________________________________

## 2023-08-08 NOTE — Progress Notes (Addendum)
COVID Vaccine Completed: yes x5  Date of COVID positive in last 90 days: June 30th home test  PCP - Daisy Floro, MD Cardiologist - Jodelle Red, MD  Cardiac clearance by Jodelle Red 05/30/23 in Epic  Chest x-ray - n/a EKG - 05/30/23 Epic 1st degree AV block and brady at 53 Stress Test - 07/11/22 Epic ECHO - yes per pt in last 10 years Cardiac Cath - 10 years ago Pacemaker/ICD device last checked: n/a Spinal Cord Stimulator: n/a  Bowel Prep - no  Sleep Study - n/a CPAP -   Fasting Blood Sugar - n/a Checks Blood Sugar _____ times a day  Last dose of GLP1 agonist-  N/A GLP1 instructions:  N/A   Last dose of SGLT-2 inhibitors-  N/A SGLT-2 instructions: N/A   Blood Thinner Instructions:  Time Aspirin Instructions: ASA 81, hold 5-7 days Last Dose:  Activity level: Can go up a flight of stairs and perform activities of daily living without stopping and without symptoms of chest pain or shortness of breath.  Anesthesia review: 1st degree AV block, HTN  Patient denies shortness of breath, fever, cough and chest pain at PAT appointment  Patient verbalized understanding of instructions that were given to them at the PAT appointment. Patient was also instructed that they will need to review over the PAT instructions again at home before surgery.

## 2023-08-09 ENCOUNTER — Other Ambulatory Visit: Payer: Self-pay

## 2023-08-09 ENCOUNTER — Other Ambulatory Visit (HOSPITAL_COMMUNITY): Payer: Self-pay | Admitting: Orthopedic Surgery

## 2023-08-09 ENCOUNTER — Encounter (HOSPITAL_COMMUNITY)
Admission: RE | Admit: 2023-08-09 | Discharge: 2023-08-09 | Disposition: A | Discharge status: Home / Self Care | Payer: Medicare Other | Source: Ambulatory Visit | Attending: Surgery | Admitting: Surgery

## 2023-08-09 ENCOUNTER — Encounter (HOSPITAL_COMMUNITY): Payer: Self-pay

## 2023-08-09 VITALS — BP 179/82 | HR 58 | Temp 98.0°F | Resp 16 | Ht 67.0 in | Wt 152.0 lb

## 2023-08-09 DIAGNOSIS — M25512 Pain in left shoulder: Secondary | ICD-10-CM

## 2023-08-09 DIAGNOSIS — N400 Enlarged prostate without lower urinary tract symptoms: Secondary | ICD-10-CM | POA: Insufficient documentation

## 2023-08-09 DIAGNOSIS — M25522 Pain in left elbow: Secondary | ICD-10-CM

## 2023-08-09 DIAGNOSIS — E210 Primary hyperparathyroidism: Secondary | ICD-10-CM | POA: Insufficient documentation

## 2023-08-09 DIAGNOSIS — I10 Essential (primary) hypertension: Secondary | ICD-10-CM | POA: Diagnosis not present

## 2023-08-09 DIAGNOSIS — Z01812 Encounter for preprocedural laboratory examination: Secondary | ICD-10-CM | POA: Insufficient documentation

## 2023-08-09 LAB — CBC
HCT: 48.6 % (ref 39.0–52.0)
Hemoglobin: 16.2 g/dL (ref 13.0–17.0)
MCH: 33.3 pg (ref 26.0–34.0)
MCHC: 33.3 g/dL (ref 30.0–36.0)
MCV: 100 fL (ref 80.0–100.0)
Platelets: 161 10*3/uL (ref 150–400)
RBC: 4.86 MIL/uL (ref 4.22–5.81)
RDW: 13.8 % (ref 11.5–15.5)
WBC: 6.5 10*3/uL (ref 4.0–10.5)
nRBC: 0 % (ref 0.0–0.2)

## 2023-08-09 LAB — BASIC METABOLIC PANEL
Anion gap: 7 (ref 5–15)
BUN: 20 mg/dL (ref 8–23)
CO2: 29 mmol/L (ref 22–32)
Calcium: 10.2 mg/dL (ref 8.9–10.3)
Chloride: 104 mmol/L (ref 98–111)
Creatinine, Ser: 1.28 mg/dL — ABNORMAL HIGH (ref 0.61–1.24)
GFR, Estimated: 58 mL/min — ABNORMAL LOW (ref >60)
Glucose, Bld: 123 mg/dL — ABNORMAL HIGH (ref 70–99)
Potassium: 4.1 mmol/L (ref 3.5–5.1)
Sodium: 140 mmol/L (ref 135–145)

## 2023-08-12 NOTE — Progress Notes (Signed)
Anesthesia Chart Review   Case: [id number] Date/Time: 08/19/23 0945   Procedure: RIGHT INFERIOR PARATHYROIDECTOMY (Right)   Anesthesia type: General   Pre-op diagnosis: PRIMARY HYPERPARATHYROIDISM   Location: [facility] ROOM 01 / [facility]   Surgeons: Darnell Level, MD       DISCUSSION:76 y.o. never smoker with h/o HTN, BPH, primary hyperparathyroidism scheduled for above procedure 08/19/2023 with Dr. Darnell Level.   Pt last seen by cardiology 05/30/2023. Per OV note, "According to the Revised Cardiac Risk Index (RCRI), his Perioperative Risk of Major Cardiac Event is (%): 0.4 His Functional Capacity in METs is: 8.33 according to the [facility] Activity Status Index (DASI). The patient is not currently having active cardiac symptoms, and they can achieve >4 METs of activity. According to ACC/AHA Guidelines, no further testing is needed.  Proceed with surgery at acceptable risk.  Our service is available as needed in the peri-operative period."  VS: BP (!) 179/82   Pulse (!) 58   Temp 36.7 C (Oral)   Resp 16   Ht 5\' 7"  (1.702 m)   Wt 68.9 kg   SpO2 99%   BMI 23.81 kg/m   PROVIDERS: Daisy Floro, MD is PCP   Jodelle Red, MD is Cardiologist  LABS: Labs reviewed: Acceptable for surgery. (all labs ordered are listed, but only abnormal results are displayed)  Labs Reviewed  BASIC METABOLIC PANEL - Abnormal; Notable for the following components:      Result Value   Glucose, Bld 123 (*)    Creatinine, Ser 1.28 (*)    GFR, Estimated 58 (*)    All other components within normal limits  CBC     IMAGES:   EKG:   CV: ETT 07/13/2022: Study Highlights       Excellent exercise capacity, achieved 12.1 METS   Peak heart rate 144 bpm (98% max age-predicted heart rate)   Hypertensive response to exercise, peak blood pressure 206/101 mmHg   Frequent PVCs, at rest and during stress/recovery. PVC frequency decreased with increased HR.   up sloping ST depression was noted.  No  evidence of ischemia Past Medical History:  Diagnosis Date   Allergic rhinitis    Anxiety    BPH (benign prostatic hyperplasia)    DDD (degenerative disc disease), cervical    Depression    ED (erectile dysfunction)    Exertional angina    cardiologist-  dr Verdis Prime---  04-21-2013 abnormal myoview with epicardial coronaries ;  per cardiac cath 05-01-2013 no sig. obstructive CAD (LAD calcification, diffuse plaqueing) ;  evidence dRCA and LV branch vasoconstriction   GERD (gastroesophageal reflux disease)    Gout 06/2020   recent episode a few weeks ago   History of adenomatous polyp of colon    History of basal cell carcinoma (BCC) excision    History of blood transfusion    History of squamous cell carcinoma excision    09/ 27/ 2019  right lower leg    HTN (hypertension)    Hypercholesteremia    Hypogonadism in male    OA (osteoarthritis)    knees, lumbar, fingers   Seasonal allergies    Skin cancer, basal cell    Systolic murmur     Past Surgical History:  Procedure Laterality Date   APPENDECTOMY  age 89   CARDIAC CATHETERIZATION     no stent   CATARACT EXTRACTION W/ INTRAOCULAR LENS  IMPLANT, BILATERAL  07/2018   COLONOSCOPY N/A 03/02/2014   Procedure: COLONOSCOPY;  Surgeon: Laurena Bering.  Bosie Clos, MD;  Location: [facility];  Service: Endoscopy;  Laterality: N/A;   CYSTOSCOPY WITH INSERTION OF UROLIFT N/A 07/04/2020   Procedure: CYSTOSCOPY WITH INSERTION OF UROLIFT;  Surgeon: Vanna Scotland, MD;  Location: [facility] ORS;  Service: Urology;  Laterality: N/A;   CYSTOSCOPY WITH URETHRAL DILATATION N/A 06/27/2020   Procedure: CYSTOSCOPY WITH URETHRAL DILATATION;  Surgeon: Vanna Scotland, MD;  Location: [facility] ORS;  Service: Urology;  Laterality: N/A;   EYE SURGERY Bilateral    cataract    FINGER SURGERY     11-14-2004 right long finger;  12-19-2007 left index finger (both done by dr sypher)   HOT HEMOSTASIS N/A 03/02/2014   Procedure: HOT HEMOSTASIS (ARGON PLASMA  COAGULATION/BICAP);  Surgeon: Shirley Friar, MD;  Location: [facility];  Service: Endoscopy;  Laterality: N/A;   INGUINAL HERNIA REPAIR Bilateral left , early 2000s;  right 12/ 2003   inguinal hernia, with mesh   JOINT REPLACEMENT     KNEE ARTHROSCOPY Bilateral right- last one 1996;  left , last one ,yrs ago   KNEE SURGERY Left 1972   open   LEFT HEART CATHETERIZATION WITH CORONARY ANGIOGRAM N/A 05/01/2013   Procedure: LEFT HEART CATHETERIZATION WITH CORONARY ANGIOGRAM;  Surgeon: Lesleigh Noe, MD;  [facility];  Service: Cardiovascular;  Laterality: N/A;   MOHS SURGERY  yrs ago   nose   RECONSTRUCTION OF NOSE  1968   TOTAL KNEE ARTHROPLASTY Left 09/16/2018   Procedure: LEFT TOTAL KNEE ARTHROPLASTY;  Surgeon: Durene Romans, MD;  Location: [facility];  Service: Orthopedics;  Laterality: Left;  70 mins   TOTAL KNEE ARTHROPLASTY Right 04/28/2019   Procedure: TOTAL KNEE ARTHROPLASTY;  Surgeon: Durene Romans, MD;  Location[facility];  Service: Orthopedics;  Laterality: Right;  70 mins    MEDICATIONS:  allopurinol (ZYLOPRIM) 300 MG tablet   ascorbic acid (VITAMIN C) 500 MG tablet   aspirin EC 81 MG tablet   B-D 3CC LUER-LOK SYR 22GX1" 22G X 1" 3 ML MISC   Baclofen 5 MG TABS   Calcium Carbonate Antacid (TUMS E-X PO)   cholecalciferol (VITAMIN D) 1000 units tablet   clonazePAM (KLONOPIN) 0.5 MG tablet   cyanocobalamin (VITAMIN B12) 1000 MCG tablet   diclofenac Sodium (VOLTAREN) 1 % GEL   fluticasone (FLONASE) 50 MCG/ACT nasal spray   hydrocortisone cream 1 %   ibuprofen (ADVIL) 200 MG tablet   MAGNESIUM GLUCONATE PO   metaxalone (SKELAXIN) 800 MG tablet   metoprolol succinate (TOPROL-XL) 25 MG 24 hr tablet   neomycin-polymyxin b-dexamethasone (MAXITROL) 3.5-10000-0.1 SUSP   pantoprazole (PROTONIX) 40 MG tablet   Polyethyl Glyc-Propyl Glyc PF (SYSTANE PRESERVATIVE FREE) 0.4-0.3 % SOLN   rosuvastatin (CRESTOR) 5 MG tablet   tadalafil (CIALIS) 20 MG tablet   tamsulosin  (FLOMAX) 0.4 MG CAPS capsule   testosterone cypionate (DEPOTESTOSTERONE CYPIONATE) 200 MG/ML injection   traMADol (ULTRAM) 50 MG tablet   No current facility-administered medications for this encounter.    Jodell Cipro Ward, PA-C [facility]Surgical [facility] [phone]

## 2023-08-14 ENCOUNTER — Encounter (HOSPITAL_COMMUNITY): Payer: Self-pay | Admitting: Surgery

## 2023-08-14 DIAGNOSIS — E210 Primary hyperparathyroidism: Secondary | ICD-10-CM | POA: Diagnosis present

## 2023-08-14 NOTE — H&P (Signed)
REFERRING PHYSICIAN: Tonita Cong, MD  PROVIDER: Braileigh Landenberger Myra Rude, MD   Chief Complaint: New Consultation (Primary hyperparathyroidism)  History of Present Illness:  Patient is referred by Dr. Talmage Coin for surgical evaluation and management of suspected primary hyperparathyroidism. Patient had been noted on routine laboratory studies to have an elevated serum calcium level. His most recent level was 11.1. Additional testing showed an unsuppressed PTH level of 35. 24-hour urine collection for calcium was elevated at 354. Patient had undergone a bone density scan which does show evidence of osteoporosis. Vitamin D levels were normal. Patient does note fatigue. He notes urinary frequency. He has had problems with constipation. He complains of bone and joint pain. He denies nephrolithiasis. Patient has had no prior surgery on his neck. There is no family history of parathyroid disease or other endocrine neoplasm. Patient is accompanied by his wife. Patient is followed by cardiology, Dr. Cristal Deer.  Review of Systems: A complete review of systems was obtained from the patient. I have reviewed this information and discussed as appropriate with the patient. See HPI as well for other ROS.  Review of Systems  Constitutional: Positive for malaise/fatigue.  HENT: Negative.  Eyes: Negative.  Respiratory: Negative.  Cardiovascular: Negative.  Gastrointestinal: Positive for constipation.  Genitourinary: Positive for frequency.  Musculoskeletal: Positive for joint pain.  Skin: Negative.  Neurological: Negative.  Endo/Heme/Allergies: Negative.  Psychiatric/Behavioral: Negative.    Medical History: History reviewed. No pertinent past medical history.  Patient Active Problem List  Diagnosis  Primary hyperparathyroidism (CMS/HHS-HCC)   History reviewed. No pertinent surgical history.   Not on File  No current outpatient medications on file prior to visit.   No current  facility-administered medications on file prior to visit.   History reviewed. No pertinent family history.   Social History   Tobacco Use  Smoking Status Not on file  Smokeless Tobacco Not on file    Social History   Socioeconomic History  Marital status: Married   Objective:   There were no vitals filed for this visit.  There is no height or weight on file to calculate BMI.  Physical Exam   GENERAL APPEARANCE Comfortable, no acute issues Development: normal Gross deformities: none  SKIN Rash, lesions, ulcers: none Induration, erythema: none Nodules: none palpable  EYES Conjunctiva and lids: normal Pupils: equal and reactive  EARS, NOSE, MOUTH, THROAT External ears: no lesion or deformity External nose: no lesion or deformity Hearing: grossly normal  NECK Symmetric: yes Trachea: midline Thyroid: no palpable nodules in the thyroid bed  ABDOMEN Not assessed  GENITOURINARY/RECTAL Not assessed  MUSCULOSKELETAL Station and gait: normal Digits and nails: no clubbing or cyanosis Muscle strength: grossly normal all extremities Range of motion: grossly normal all extremities Deformity: none  LYMPHATIC Cervical: none palpable Supraclavicular: none palpable  PSYCHIATRIC Oriented to person, place, and time: yes Mood and affect: normal for situation Judgment and insight: appropriate for situation   Assessment and Plan:   Primary hyperparathyroidism (CMS/HHS-HCC)  Patient is referred by Dr. Debara Pickett for surgical evaluation and recommendations regarding suspected primary hyperparathyroidism.  Patient provided with a copy of "Parathyroid Surgery: Treatment for Your Parathyroid Gland Problem", published by [employer], 12 pages. Book reviewed and explained to patient during visit today.  Today we reviewed his clinical history. We reviewed his laboratory study results. We reviewed the results of his bone density scan. I would like to proceed with an ultrasound  of the neck and a nuclear medicine parathyroid scan with  sestamibi. Patient likely has primary hyperparathyroidism. We discussed the imaging studies. We discussed the possible need for a 4D CT scan with parathyroid protocol. Hopefully we will be able to identify a parathyroid adenoma. If so the patient should be a good candidate for minimally invasive parathyroid surgery as an outpatient surgical procedure. We did discuss the procedure today. We discussed the size and location of the surgical incision. We discussed risk and benefits including the risk of recurrent nerve injury. We discussed the hospital stay and the postoperative recovery. The patient understands and wishes to proceed with diagnostic studies in the near future.  Patient will undergo the above studies. We will contact him with the results and make plans for further management at that time.   Darnell Level, MD [facility] A [facility] practice Office: [phone]

## 2023-08-15 ENCOUNTER — Ambulatory Visit (HOSPITAL_COMMUNITY)
Admission: RE | Admit: 2023-08-15 | Discharge: 2023-08-15 | Disposition: A | Discharge status: Home / Self Care | Payer: Medicare Other | Source: Ambulatory Visit | Attending: Orthopedic Surgery | Admitting: Orthopedic Surgery

## 2023-08-15 DIAGNOSIS — M25522 Pain in left elbow: Secondary | ICD-10-CM | POA: Diagnosis present

## 2023-08-15 DIAGNOSIS — M25512 Pain in left shoulder: Secondary | ICD-10-CM | POA: Diagnosis present

## 2023-08-19 ENCOUNTER — Other Ambulatory Visit: Payer: Self-pay

## 2023-08-19 ENCOUNTER — Ambulatory Visit (HOSPITAL_BASED_OUTPATIENT_CLINIC_OR_DEPARTMENT_OTHER): Payer: Medicare Other | Admitting: Anesthesiology

## 2023-08-19 ENCOUNTER — Encounter (HOSPITAL_COMMUNITY): Payer: Self-pay | Admitting: Surgery

## 2023-08-19 ENCOUNTER — Ambulatory Visit (HOSPITAL_COMMUNITY)
Admission: RE | Admit: 2023-08-19 | Discharge: 2023-08-19 | Disposition: A | Discharge status: Home / Self Care | Payer: Medicare Other | Source: Ambulatory Visit | Attending: Surgery | Admitting: Surgery

## 2023-08-19 ENCOUNTER — Ambulatory Visit (HOSPITAL_COMMUNITY): Payer: Medicare Other | Admitting: Physician Assistant

## 2023-08-19 ENCOUNTER — Encounter (HOSPITAL_COMMUNITY)
Admission: RE | Disposition: A | Discharge status: Home / Self Care | Payer: Self-pay | Source: Ambulatory Visit | Attending: Surgery

## 2023-08-19 DIAGNOSIS — R350 Frequency of micturition: Secondary | ICD-10-CM | POA: Diagnosis not present

## 2023-08-19 DIAGNOSIS — K5900 Constipation, unspecified: Secondary | ICD-10-CM | POA: Diagnosis not present

## 2023-08-19 DIAGNOSIS — F32A Depression, unspecified: Secondary | ICD-10-CM | POA: Diagnosis not present

## 2023-08-19 DIAGNOSIS — K219 Gastro-esophageal reflux disease without esophagitis: Secondary | ICD-10-CM | POA: Diagnosis not present

## 2023-08-19 DIAGNOSIS — E210 Primary hyperparathyroidism: Secondary | ICD-10-CM

## 2023-08-19 DIAGNOSIS — M2550 Pain in unspecified joint: Secondary | ICD-10-CM | POA: Insufficient documentation

## 2023-08-19 DIAGNOSIS — I10 Essential (primary) hypertension: Secondary | ICD-10-CM | POA: Diagnosis not present

## 2023-08-19 DIAGNOSIS — M1990 Unspecified osteoarthritis, unspecified site: Secondary | ICD-10-CM | POA: Insufficient documentation

## 2023-08-19 DIAGNOSIS — D351 Benign neoplasm of parathyroid gland: Secondary | ICD-10-CM | POA: Insufficient documentation

## 2023-08-19 DIAGNOSIS — F419 Anxiety disorder, unspecified: Secondary | ICD-10-CM | POA: Diagnosis not present

## 2023-08-19 HISTORY — PX: PARATHYROIDECTOMY: SHX19

## 2023-08-19 SURGERY — PARATHYROIDECTOMY
Anesthesia: General | Laterality: Right

## 2023-08-19 MED ORDER — PROPOFOL 10 MG/ML IV BOLUS
INTRAVENOUS | Status: AC
  Filled 2023-08-19: qty 20

## 2023-08-19 MED ORDER — PROPOFOL 10 MG/ML IV BOLUS
INTRAVENOUS | Status: DC | PRN
  Administered 2023-08-19: 140 mg via INTRAVENOUS

## 2023-08-19 MED ORDER — FENTANYL CITRATE PF 50 MCG/ML IJ SOSY: 25.0000 ug | PREFILLED_SYRINGE | INTRAMUSCULAR | Status: DC | PRN

## 2023-08-19 MED ORDER — LACTATED RINGERS IV SOLN: INTRAVENOUS | Status: DC

## 2023-08-19 MED ORDER — ONDANSETRON HCL 4 MG/2ML IJ SOLN
INTRAMUSCULAR | Status: DC | PRN
  Administered 2023-08-19: 4 mg via INTRAVENOUS

## 2023-08-19 MED ORDER — BUPIVACAINE HCL (PF) 0.25 % IJ SOLN
INTRAMUSCULAR | Status: AC
  Filled 2023-08-19: qty 30

## 2023-08-19 MED ORDER — 0.9 % SODIUM CHLORIDE (POUR BTL) OPTIME
TOPICAL | Status: DC | PRN
  Administered 2023-08-19: 1000 mL

## 2023-08-19 MED ORDER — PHENYLEPHRINE 80 MCG/ML (10ML) SYRINGE FOR IV PUSH (FOR BLOOD PRESSURE SUPPORT)
PREFILLED_SYRINGE | INTRAVENOUS | Status: AC
  Filled 2023-08-19: qty 10

## 2023-08-19 MED ORDER — PHENYLEPHRINE 80 MCG/ML (10ML) SYRINGE FOR IV PUSH (FOR BLOOD PRESSURE SUPPORT)
PREFILLED_SYRINGE | INTRAVENOUS | Status: DC | PRN
  Administered 2023-08-19: 160 ug via INTRAVENOUS

## 2023-08-19 MED ORDER — LIDOCAINE HCL (PF) 2 % IJ SOLN
INTRAMUSCULAR | Status: AC
  Filled 2023-08-19: qty 5

## 2023-08-19 MED ORDER — CHLORHEXIDINE GLUCONATE CLOTH 2 % EX PADS: 6.0000 | MEDICATED_PAD | Freq: Once | CUTANEOUS | Status: DC

## 2023-08-19 MED ORDER — CIPROFLOXACIN IN D5W 400 MG/200ML IV SOLN
400.0000 mg | INTRAVENOUS | Status: AC
  Administered 2023-08-19: 400 mg via INTRAVENOUS
  Filled 2023-08-19: qty 200

## 2023-08-19 MED ORDER — ROCURONIUM BROMIDE 10 MG/ML (PF) SYRINGE
PREFILLED_SYRINGE | INTRAVENOUS | Status: AC
  Filled 2023-08-19: qty 10

## 2023-08-19 MED ORDER — FENTANYL CITRATE (PF) 250 MCG/5ML IJ SOLN
INTRAMUSCULAR | Status: AC
  Filled 2023-08-19: qty 5

## 2023-08-19 MED ORDER — ROCURONIUM BROMIDE 10 MG/ML (PF) SYRINGE
PREFILLED_SYRINGE | INTRAVENOUS | Status: DC | PRN
  Administered 2023-08-19: 50 mg via INTRAVENOUS

## 2023-08-19 MED ORDER — EPHEDRINE 5 MG/ML INJ
INTRAVENOUS | Status: AC
  Filled 2023-08-19: qty 5

## 2023-08-19 MED ORDER — CHLORHEXIDINE GLUCONATE 0.12 % MT SOLN
15.0000 mL | Freq: Once | OROMUCOSAL | Status: AC
  Administered 2023-08-19: 15 mL via OROMUCOSAL

## 2023-08-19 MED ORDER — ORAL CARE MOUTH RINSE: 15.0000 mL | Freq: Once | OROMUCOSAL | Status: AC

## 2023-08-19 MED ORDER — SUGAMMADEX SODIUM 200 MG/2ML IV SOLN
INTRAVENOUS | Status: DC | PRN
  Administered 2023-08-19: 300 mg via INTRAVENOUS

## 2023-08-19 MED ORDER — ACETAMINOPHEN 500 MG PO TABS
1000.0000 mg | ORAL_TABLET | Freq: Four times a day (QID) | ORAL | Status: DC | PRN
  Administered 2023-08-19: 1000 mg via ORAL

## 2023-08-19 MED ORDER — BUPIVACAINE HCL 0.25 % IJ SOLN
INTRAMUSCULAR | Status: DC | PRN
  Administered 2023-08-19: 9 mL

## 2023-08-19 MED ORDER — FENTANYL CITRATE (PF) 250 MCG/5ML IJ SOLN
INTRAMUSCULAR | Status: DC | PRN
  Administered 2023-08-19: 100 ug via INTRAVENOUS

## 2023-08-19 MED ORDER — DEXAMETHASONE SODIUM PHOSPHATE 10 MG/ML IJ SOLN
INTRAMUSCULAR | Status: DC | PRN
  Administered 2023-08-19: 4 mg via INTRAVENOUS

## 2023-08-19 MED ORDER — EPHEDRINE SULFATE-NACL 50-0.9 MG/10ML-% IV SOSY
PREFILLED_SYRINGE | INTRAVENOUS | Status: DC | PRN
  Administered 2023-08-19: 10 mg via INTRAVENOUS
  Administered 2023-08-19: 5 mg via INTRAVENOUS
  Administered 2023-08-19: 10 mg via INTRAVENOUS
  Administered 2023-08-19 (×2): 5 mg via INTRAVENOUS
  Administered 2023-08-19: 10 mg via INTRAVENOUS

## 2023-08-19 MED ORDER — ACETAMINOPHEN 10 MG/ML IV SOLN: 1000.0000 mg | Freq: Once | INTRAVENOUS | Status: DC | PRN

## 2023-08-19 MED ORDER — HEMOSTATIC AGENTS (NO CHARGE) OPTIME
TOPICAL | Status: DC | PRN
  Administered 2023-08-19: 1

## 2023-08-19 MED ORDER — ACETAMINOPHEN 500 MG PO TABS
ORAL_TABLET | ORAL | Status: AC
  Filled 2023-08-19: qty 2

## 2023-08-19 MED ORDER — DEXAMETHASONE SODIUM PHOSPHATE 10 MG/ML IJ SOLN
INTRAMUSCULAR | Status: AC
  Filled 2023-08-19: qty 1

## 2023-08-19 MED ORDER — LIDOCAINE 2% (20 MG/ML) 5 ML SYRINGE
INTRAMUSCULAR | Status: DC | PRN
  Administered 2023-08-19: 60 mg via INTRAVENOUS

## 2023-08-19 MED ORDER — ONDANSETRON HCL 4 MG/2ML IJ SOLN
INTRAMUSCULAR | Status: AC
  Filled 2023-08-19: qty 2

## 2023-08-19 SURGICAL SUPPLY — 35 items
ADH SKN CLS APL DERMABOND .7 (GAUZE/BANDAGES/DRESSINGS) ×1
APL PRP STRL LF DISP 70% ISPRP (MISCELLANEOUS) ×1
ATTRACTOMAT 16X20 MAGNETIC DRP (DRAPES) ×1 IMPLANT
BAG COUNTER SPONGE SURGICOUNT (BAG) ×1 IMPLANT
BAG SPNG CNTER NS LX DISP (BAG) ×1
BLADE SURG 15 STRL LF DISP TIS (BLADE) ×1 IMPLANT
BLADE SURG 15 STRL SS (BLADE) ×1
CHLORAPREP W/TINT 26 (MISCELLANEOUS) ×1 IMPLANT
CLIP TI MEDIUM 6 (CLIP) ×2 IMPLANT
CLIP TI WIDE RED SMALL 6 (CLIP) ×2 IMPLANT
COVER SURGICAL LIGHT HANDLE (MISCELLANEOUS) ×1 IMPLANT
DERMABOND ADVANCED .7 DNX12 (GAUZE/BANDAGES/DRESSINGS) ×1 IMPLANT
DRAPE LAPAROTOMY T 98X78 PEDS (DRAPES) ×1 IMPLANT
DRAPE UTILITY XL STRL (DRAPES) ×1 IMPLANT
ELECT REM PT RETURN 15FT ADLT (MISCELLANEOUS) ×1 IMPLANT
GAUZE 4X4 16PLY ~~LOC~~+RFID DBL (SPONGE) ×1 IMPLANT
GLOVE SURG ORTHO 8.0 STRL STRW (GLOVE) ×1 IMPLANT
GOWN STRL REUS W/ TWL XL LVL3 (GOWN DISPOSABLE) ×3 IMPLANT
GOWN STRL REUS W/TWL XL LVL3 (GOWN DISPOSABLE) ×3
HEMOSTAT SURGICEL 2X4 FIBR (HEMOSTASIS) ×1 IMPLANT
ILLUMINATOR WAVEGUIDE N/F (MISCELLANEOUS) IMPLANT
KIT BASIN OR (CUSTOM PROCEDURE TRAY) ×1 IMPLANT
KIT TURNOVER KIT A (KITS) IMPLANT
NDL HYPO 22X1.5 SAFETY MO (MISCELLANEOUS) ×1 IMPLANT
NEEDLE HYPO 22X1.5 SAFETY MO (MISCELLANEOUS) ×1
PACK BASIC VI WITH GOWN DISP (CUSTOM PROCEDURE TRAY) ×1 IMPLANT
PENCIL SMOKE EVACUATOR (MISCELLANEOUS) ×1 IMPLANT
SHEARS HARMONIC 9CM CVD (BLADE) IMPLANT
SUT MNCRL AB 4-0 PS2 18 (SUTURE) ×1 IMPLANT
SUT VIC AB 3-0 SH 18 (SUTURE) ×1 IMPLANT
SYR BULB IRRIG 60ML STRL (SYRINGE) ×1 IMPLANT
SYR CONTROL 10ML LL (SYRINGE) ×1 IMPLANT
TOWEL OR 17X26 10 PK STRL BLUE (TOWEL DISPOSABLE) ×1 IMPLANT
TOWEL OR NON WOVEN STRL DISP B (DISPOSABLE) ×1 IMPLANT
TUBING CONNECTING 10 (TUBING) ×1 IMPLANT

## 2023-08-19 NOTE — Anesthesia Procedure Notes (Signed)
Procedure Name: Intubation Date/Time: 08/19/2023 10:41 AM  Performed by: Lovie Chol, CRNAPre-anesthesia Checklist: Patient identified, Emergency Drugs available, Suction available and Patient being monitored Patient Re-evaluated:Patient Re-evaluated prior to induction Oxygen Delivery Method: Circle System Utilized Preoxygenation: Pre-oxygenation with 100% oxygen Induction Type: IV induction Ventilation: Mask ventilation without difficulty Laryngoscope Size: Miller and 3 Grade View: Grade I Tube type: Oral Tube size: 7.0 mm Number of attempts: 1 Airway Equipment and Method: Stylet and Oral airway Placement Confirmation: ETT inserted through vocal cords under direct vision, positive ETCO2 and breath sounds checked- equal and bilateral Secured at: 21 cm Tube secured with: Tape Dental Injury: Teeth and Oropharynx as per pre-operative assessment

## 2023-08-19 NOTE — Anesthesia Preprocedure Evaluation (Addendum)
Anesthesia Evaluation  Patient identified by MRN, date of birth, ID band Patient awake    Reviewed: Allergy & Precautions, NPO status , Patient's Chart, lab work & pertinent test results  Airway Mallampati: II  TM Distance: >3 FB Neck ROM: Full    Dental no notable dental hx. (+) Teeth Intact, Dental Advisory Given   Pulmonary neg pulmonary ROS   Pulmonary exam normal        Cardiovascular hypertension, Pt. on medications and Pt. on home beta blockers  Rhythm:Regular Rate:Normal     Neuro/Psych   Anxiety Depression    negative neurological ROS     GI/Hepatic Neg liver ROS,GERD  Medicated,,  Endo/Other  negative endocrine ROS    Renal/GU negative Renal ROS  negative genitourinary   Musculoskeletal  (+) Arthritis , Osteoarthritis,    Abdominal Normal abdominal exam  (+)   Peds  Hematology Lab Results      Component                Value               Date                      WBC                      6.5                 08/09/2023                HGB                      16.2                08/09/2023                HCT                      48.6                08/09/2023                MCV                      100.0               08/09/2023                PLT                      161                 08/09/2023              Anesthesia Other Findings   Reproductive/Obstetrics                              Anesthesia Physical Anesthesia Plan  ASA: 2  Anesthesia Plan: General   Post-op Pain Management:    Induction: Intravenous  PONV Risk Score and Plan: 2 and Ondansetron, Dexamethasone and Treatment may vary due to age or medical condition  Airway Management Planned: Mask and Oral ETT  Additional Equipment: None  Intra-op Plan:   Post-operative Plan: Extubation in OR  Informed Consent: I have reviewed the patients History and Physical, chart, labs and discussed the procedure  including the  risks, benefits and alternatives for the proposed anesthesia with the patient or authorized representative who has indicated his/her understanding and acceptance.     Dental advisory given  Plan Discussed with: CRNA  Anesthesia Plan Comments:        Anesthesia Quick Evaluation

## 2023-08-19 NOTE — Transfer of Care (Signed)
Immediate Anesthesia Transfer of Care Note  Patient: Dale [name]  Procedure(s) Performed: RIGHT INFERIOR PARATHYROIDECTOMY (Right)  Patient Location: PACU  Anesthesia Type:General  Level of Consciousness: oriented, drowsy, and patient cooperative  Airway & Oxygen Therapy: Patient Spontanous Breathing and Patient connected to nasal cannula oxygen  Post-op Assessment: Report given to RN and Post -op Vital signs reviewed and stable  Post vital signs: Reviewed  Last Vitals:  Vitals Value Taken Time  BP 161/78 08/19/23 1141  Temp    Pulse 64 08/19/23 1143  Resp 0 08/19/23 1143  SpO2 99 % 08/19/23 1143  Vitals shown include unfiled device data.  Last Pain:  Vitals:   08/19/23 0809  TempSrc:   PainSc: 0-No pain      Patients Stated Pain Goal: 5 (08/19/23 0809)  Complications: No notable events documented.

## 2023-08-19 NOTE — Transfer of Care (Deleted)
Immediate Anesthesia Transfer of Care Note  Patient: Dale [name]  Procedure(s) Performed: RIGHT INFERIOR PARATHYROIDECTOMY (Right)  Patient Location: PACU  Anesthesia Type:General  Level of Consciousness: oriented, drowsy, and patient cooperative  Airway & Oxygen Therapy: Patient Spontanous Breathing and Patient connected to face mask oxygen  Post-op Assessment: Report given to RN and Post -op Vital signs reviewed and stable  Post vital signs: Reviewed  Last Vitals:  Vitals Value Taken Time  BP    Temp    Pulse    Resp    SpO2      Last Pain:  Vitals:   08/19/23 0809  TempSrc:   PainSc: 0-No pain      Patients Stated Pain Goal: 5 (08/19/23 0809)  Complications: No notable events documented.

## 2023-08-19 NOTE — Discharge Instructions (Addendum)
[facility] - Dr. Darnell Level  THYROID & PARATHYROID SURGERY:  POST-OP INSTRUCTIONS  Always review the instruction sheet provided by the hospital nurse at discharge.  A prescription for pain medication may be sent to your pharmacy at the time of discharge.  Take your pain medication as prescribed.  If narcotic pain medicine is not needed, then you may take acetaminophen (Tylenol) or ibuprofen (Advil) as needed for pain or soreness.  Take your normal home medications as prescribed unless otherwise directed.  If you need a refill on your pain medication, please contact the office during regular business hours.  Prescriptions will not be processed by the office after 5:00PM or on weekends.  Start with a light diet upon arrival home, such as soup and crackers or toast.  Be sure to drink plenty of fluids.  Resume your normal diet the day after surgery.  Most patients will experience some swelling and bruising on the chest and neck area.  Ice packs will help for the first 48 hours after arriving home.  Swelling and bruising will take several days to resolve.   It is common to experience some constipation after surgery.  Increasing fluid intake and taking a stool softener (Colace) will usually help to prevent this problem.  A mild laxative (Milk of Magnesia or Miralax) should be taken according to package directions if there has been no bowel movement after 48 hours.  Dermabond glue covers your incision. This seals the wound and you may shower at any time. The Dermabond will remain in place for about a week.  You may gradually remove the glue when it loosens around the edges.  If you need to loosen the Dermabond for removal, apply a layer of Vaseline to the wound for 15 minutes and then remove with a Kleenex. Your sutures are under the skin and will not show - they will dissolve on their own.  You may resume light daily activities beginning the day after discharge (such as self-care,  walking, climbing stairs), gradually increasing activities as tolerated. You may have sexual intercourse when it is comfortable. Refrain from any heavy lifting or straining until approved by your doctor. You may drive when you no longer are taking prescription pain medication, you can comfortably wear a seatbelt, and you can safely maneuver your car and apply the brakes.  You will see your doctor in the office for a follow-up appointment approximately three weeks after your surgery.  Make sure that you call for this appointment within a day or two after you arrive home to insure a convenient appointment time. Please have any requested laboratory tests performed a few days prior to your office visit so that the results will be available at your follow up appointment.  WHEN TO CALL THE [facility] OFFICE: -- Fever greater than 101.5 -- Inability to urinate -- Nausea and/or vomiting - persistent -- Extreme swelling or bruising -- Continued bleeding from incision -- Increased pain, redness, or drainage from the incision -- Difficulty swallowing or breathing -- Muscle cramping or spasms -- Numbness or tingling in hands or around lips  The clinic staff is available to answer your questions during regular business hours.  Please don't hesitate to call and ask to speak to one of the nurses if you have concerns.  [facility] OFFICE: [phone] (24 hours)  Please sign up for MyChart accounts. This will allow you to communicate directly with my nurse or myself without having to call the office. It will also allow you  to view your test results. You will need to enroll in MyChart for my office ([facility]) and for the hospital [facility]).  Darnell Level, MD [facility] A [facility] practice

## 2023-08-19 NOTE — Op Note (Signed)
OPERATIVE REPORT - PARATHYROIDECTOMY  Preoperative diagnosis: Primary hyperparathyroidism  Postop diagnosis: Same  Procedure: Right inferior minimally invasive parathyroidectomy  Surgeon:  Darnell Level, MD  Assistant:  Saunders Glance, PA-C  Anesthesia: General endotracheal  Estimated blood loss: Minimal  Preparation: ChloraPrep  Indications: Patient is referred by Dr. Talmage Coin for surgical evaluation and management of suspected primary hyperparathyroidism. Patient had been noted on routine laboratory studies to have an elevated serum calcium level. His most recent level was 11.1. Additional testing showed an unsuppressed PTH level of 35. 24-hour urine collection for calcium was elevated at 354. Patient had undergone a bone density scan which does show evidence of osteoporosis. Vitamin D levels were normal. Patient does note fatigue. He notes urinary frequency. He has had problems with constipation. He complains of bone and joint pain. He denies nephrolithiasis. Patient has had no prior surgery on his neck. There is no family history of parathyroid disease or other endocrine neoplasm. Nuclear med sestamibi scan and USN both localized a right inferior adenoma.  Patient now comes to surgery for parathyroidectomy.  Procedure: The patient was prepared in the pre-operative holding area. The patient was brought to the operating room and placed in a supine position on the operating room table. Following administration of general anesthesia, the patient was positioned and then prepped and draped in the usual strict aseptic fashion. After ascertaining that an adequate level of anesthesia been achieved, a neck incision was made with a #15 blade. Dissection was carried through subcutaneous tissues and platysma. Hemostasis was obtained with the electrocautery. Skin flaps were developed circumferentially and a Weitlander retractor was placed for exposure.  Strap muscles were incised in the midline. Strap  muscles were reflected laterally exposing the thyroid lobe. With gentle blunt dissection the thyroid lobe was mobilized.  Dissection was carried posteriorly and an enlarged parathyroid gland was identified. It was gently mobilized. Vascular structures were divided between small ligaclips. Care was taken to avoid the recurrent laryngeal nerve. The parathyroid gland was completely excised. It was submitted to pathology where frozen section confirmed hypercellular parathyroid tissue consistent with adenoma.  Neck was irrigated with warm saline and good hemostasis was noted. Fibrillar was placed in the operative field. Strap muscles were approximated in the midline with interrupted 3-0 Vicryl sutures. Platysma was closed with interrupted 3-0 Vicryl sutures. Marcaine was infiltrated circumferentially. Skin was closed with a running 4-0 Monocryl subcuticular suture. Wound was washed and dried and Dermabond was applied. Patient was awakened from anesthesia and brought to the recovery room. The patient tolerated the procedure well.   Darnell Level, MD [facility] Office: [phone]

## 2023-08-19 NOTE — Anesthesia Postprocedure Evaluation (Signed)
Anesthesia Post Note  Patient: Dale [name]  Procedure(s) Performed: RIGHT INFERIOR PARATHYROIDECTOMY (Right)     Patient location during evaluation: PACU Anesthesia Type: General Level of consciousness: awake and alert Pain management: pain level controlled Vital Signs Assessment: post-procedure vital signs reviewed and stable Respiratory status: spontaneous breathing, nonlabored ventilation and respiratory function stable Cardiovascular status: stable and blood pressure returned to baseline Anesthetic complications: no   No notable events documented.  Last Vitals:  Vitals:   08/19/23 1215 08/19/23 1226  BP: (!) 166/89 (!) 144/91  Pulse: 62   Resp: 15 15  Temp: 36.4 C 36.8 C  SpO2: 97% 96%    Last Pain:  Vitals:   08/19/23 1234  TempSrc:   PainSc: 3                  Beryle Lathe

## 2023-08-19 NOTE — Interval H&P Note (Signed)
History and Physical Interval Note:  08/19/2023 9:43 AM  Dale [name]  has presented today for surgery, with the diagnosis of PRIMARY HYPERPARATHYROIDISM.  The various methods of treatment have been discussed with the patient and family. After consideration of risks, benefits and other options for treatment, the patient has consented to    Procedure(s): RIGHT INFERIOR PARATHYROIDECTOMY (Right) as a surgical intervention.    The patient's history has been reviewed, patient examined, no change in status, stable for surgery.  I have reviewed the patient's chart and labs.  Questions were answered to the patient's satisfaction.    Darnell Level, MD [facility] practice Office: [phone]   Darnell Level

## 2023-08-20 ENCOUNTER — Encounter (HOSPITAL_COMMUNITY): Payer: Self-pay | Admitting: Surgery

## 2023-08-20 LAB — SURGICAL PATHOLOGY

## 2023-09-27 ENCOUNTER — Encounter: Payer: Self-pay | Admitting: Urology

## 2023-09-30 ENCOUNTER — Other Ambulatory Visit (HOSPITAL_COMMUNITY): Payer: Self-pay | Admitting: Urology

## 2023-09-30 DIAGNOSIS — R972 Elevated prostate specific antigen [PSA]: Secondary | ICD-10-CM

## 2023-10-07 ENCOUNTER — Ambulatory Visit (HOSPITAL_COMMUNITY)
Admission: RE | Admit: 2023-10-07 | Discharge: 2023-10-07 | Disposition: A | Discharge status: Home / Self Care | Payer: Medicare Other | Source: Ambulatory Visit | Attending: Urology | Admitting: Urology

## 2023-10-07 DIAGNOSIS — R972 Elevated prostate specific antigen [PSA]: Secondary | ICD-10-CM

## 2023-10-07 MED ORDER — GADOBUTROL 1 MMOL/ML IV SOLN
7.0000 mL | Freq: Once | INTRAVENOUS | Status: AC | PRN
  Administered 2023-10-07: 7 mL via INTRAVENOUS

## 2023-11-15 ENCOUNTER — Other Ambulatory Visit: Payer: Medicare Other

## 2023-11-22 ENCOUNTER — Encounter: Payer: Self-pay | Admitting: Diagnostic Neuroimaging

## 2023-11-22 ENCOUNTER — Ambulatory Visit (INDEPENDENT_AMBULATORY_CARE_PROVIDER_SITE_OTHER): Payer: Medicare Other | Admitting: Diagnostic Neuroimaging

## 2023-11-22 VITALS — BP 154/69 | HR 58 | Ht 67.0 in | Wt 157.4 lb

## 2023-11-22 DIAGNOSIS — M5412 Radiculopathy, cervical region: Secondary | ICD-10-CM | POA: Diagnosis not present

## 2023-11-22 DIAGNOSIS — R200 Anesthesia of skin: Secondary | ICD-10-CM | POA: Diagnosis not present

## 2023-11-22 DIAGNOSIS — R202 Paresthesia of skin: Secondary | ICD-10-CM

## 2023-11-22 NOTE — Progress Notes (Signed)
[facility]  PATIENT: Dale [name] DOB: [birth year]  REFERRING CLINICIAN: Daisy Floro, MD HISTORY FROM: patient  REASON FOR VISIT: new consult   HISTORICAL  CHIEF COMPLAINT:  Chief Complaint  Patient presents with   Room 6    Pt is here Alone. Pt states that he has numbness and pain in his wrist near his thumb area. Pt states that he has been having this pain since March 29th 2023. Pt states that he has some numbness that will go up his wrist to his pinky that is on his right side.     HISTORY OF PRESENT ILLNESS:   77 year old left-handed male here for evaluation of right fifth digit numbness.  Patient has been playing pickle ball since 2019. Around 2022 he developed some left forearm discomfort treated conservatively with physical therapy.  In 2023 this was slightly exacerbated and MRI of the left elbow demonstrated some tendinosis.  He received a cortisone injection in his left forearm on 02/27/2022.  The next day patient noted new onset of pain and discomfort from his right neck, right shoulder and right arm that worsened throughout the day.  This is managed with right sided trigger point injection and physical therapy.  In March 2023 he had MRI of the cervical spine which demonstrated a right C7-T1 disc herniation with potential management upon the right C8 nerve root.  This was recommended to be treated conservatively.  He also had EMG nerve conduction study demonstrating some right ulnar and median neuropathies, not otherwise localized.  This was also recommended to be treated conservatively.  Since that time his right upper extremity symptoms have improved.  He is left with some mild residual numbness sensation in his right fifth digit.  No weakness.  This does not impact his day-to-day functioning.  Patient requested second opinion evaluation.     REVIEW OF SYSTEMS: Full 14 system review of systems performed and negative with exception of: as  per HPI.  ALLERGIES: Allergies  Allergen Reactions   Latex Itching    Redness. Has never had RAST testing. Bandaids/ dressings causes the itching and redness.   Penicillamine Other (See Comments)   Penicillins Itching and Rash    Fever. TOLERATED CEPHALEXIN. Has patient had a PCN reaction causing immediate rash, facial/tongue/throat swelling, SOB or lightheadedness with hypotension: No Has patient had a PCN reaction causing severe rash involving mucus membranes or skin necrosis: No Has patient had a PCN reaction that required hospitalization: No Has patient had a PCN reaction occurring within the last 10 years: No If all of the above answers are "NO", then may proceed with Cephalosporin use.    Pneumococcal Vaccines Swelling, Rash and Other (See Comments)    Low grade fever. Puffy at sight of injection    HOME MEDICATIONS: Outpatient Medications Prior to Visit  Medication Sig Dispense Refill   alendronate (FOSAMAX) 70 MG tablet Take 70 mg by mouth once a week. Take with a full glass of water on an empty stomach.     allopurinol (ZYLOPRIM) 300 MG tablet Take 450 mg by mouth daily.     ascorbic acid (VITAMIN C) 500 MG tablet Take 500 mg by mouth daily.     aspirin EC 81 MG tablet Take 81 mg by mouth daily. Swallow whole.     B-D 3CC LUER-LOK SYR 22GX1" 22G X 1" 3 ML MISC See admin instructions.  0   Baclofen 5 MG TABS Take 5 mg by mouth daily as needed (back  pain).     Calcium Carbonate Antacid (TUMS E-X PO) Take 2 tablets by mouth daily as needed (heartburn).      cholecalciferol (VITAMIN D) 1000 units tablet Take 1,000 Units by mouth daily.      clonazePAM (KLONOPIN) 0.5 MG tablet Take 0.25 mg by mouth daily as needed for anxiety.     cyanocobalamin (VITAMIN B12) 1000 MCG tablet Take 1,000 mcg by mouth daily.     diclofenac Sodium (VOLTAREN) 1 % GEL Apply 2 g topically daily as needed (pain).     fluticasone (FLONASE) 50 MCG/ACT nasal spray Place 1 spray into the nose daily.      hydrocortisone cream 1 % Apply 1 application topically daily as needed for itching.   2   ibuprofen (ADVIL) 200 MG tablet Take 400 mg by mouth every 6 (six) hours as needed for moderate pain.     MAGNESIUM GLUCONATE PO Take 200 mg by mouth.     metoprolol succinate (TOPROL-XL) 25 MG 24 hr tablet Take 1 tablet (25 mg total) by mouth daily. 90 tablet 3   pantoprazole (PROTONIX) 40 MG tablet Take 40 mg by mouth at bedtime as needed (heartburn).     Polyethyl Glyc-Propyl Glyc PF (SYSTANE PRESERVATIVE FREE) 0.4-0.3 % SOLN Place 1 drop into both eyes daily. lubricating eye drops     rosuvastatin (CRESTOR) 5 MG tablet Take 1 tablet (5 mg total) by mouth daily. 90 tablet 3   tadalafil (CIALIS) 20 MG tablet Take 20 mg by mouth daily as needed for erectile dysfunction.     tamsulosin (FLOMAX) 0.4 MG CAPS capsule Take 0.4 mg by mouth daily.     traMADol (ULTRAM) 50 MG tablet Take 50 mg by mouth every 6 (six) hours as needed for moderate pain.     metaxalone (SKELAXIN) 800 MG tablet Take 800 mg by mouth daily as needed for muscle spasms.     neomycin-polymyxin b-dexamethasone (MAXITROL) 3.5-10000-0.1 SUSP Place 2 drops into both eyes 4 (four) times daily as needed.     testosterone cypionate (DEPOTESTOSTERONE CYPIONATE) 200 MG/ML injection Inject 0.3 mLs into the muscle once a week. (Patient not taking: Reported on 11/22/2023)  0   No facility-administered medications prior to visit.    PAST MEDICAL HISTORY: Past Medical History:  Diagnosis Date   Allergic rhinitis    Anxiety    BPH (benign prostatic hyperplasia)    DDD (degenerative disc disease), cervical    Depression    ED (erectile dysfunction)    Exertional angina [facility])    cardiologist-  dr Verdis Prime---  04-21-2013 abnormal myoview with epicardial coronaries ;  per cardiac cath 05-01-2013 no sig. obstructive CAD (LAD calcification, diffuse plaqueing) ;  evidence dRCA and LV branch vasoconstriction   GERD (gastroesophageal reflux disease)     Gout 06/2020   recent episode a few weeks ago   History of adenomatous polyp of colon    History of basal cell carcinoma (BCC) excision    History of blood transfusion    History of squamous cell carcinoma excision    09/ 27/ 2019  right lower leg    HTN (hypertension)    Hypercholesteremia    Hypogonadism in male    OA (osteoarthritis)    knees, lumbar, fingers   Seasonal allergies    Skin cancer, basal cell    Systolic murmur     PAST SURGICAL HISTORY: Past Surgical History:  Procedure Laterality Date   APPENDECTOMY  age 23   CARDIAC CATHETERIZATION  no stent   CATARACT EXTRACTION W/ INTRAOCULAR LENS  IMPLANT, BILATERAL  07/2018   COLONOSCOPY N/A 03/02/2014   Procedure: COLONOSCOPY;  Surgeon: Shirley Friar, MD;  Location: [facility];  Service: Endoscopy;  Laterality: N/A;   CYSTOSCOPY WITH INSERTION OF UROLIFT N/A 07/04/2020   Procedure: CYSTOSCOPY WITH INSERTION OF UROLIFT;  Surgeon: Vanna Scotland, MD;  Location: [facility] ORS;  Service: Urology;  Laterality: N/A;   CYSTOSCOPY WITH URETHRAL DILATATION N/A 06/27/2020   Procedure: CYSTOSCOPY WITH URETHRAL DILATATION;  Surgeon: Vanna Scotland, MD;  Location: [facility] ORS;  Service: Urology;  Laterality: N/A;   EYE SURGERY Bilateral    cataract    FINGER SURGERY     11-14-2004 right long finger;  12-19-2007 left index finger (both done by dr sypher)   HOT HEMOSTASIS N/A 03/02/2014   Procedure: HOT HEMOSTASIS (ARGON PLASMA COAGULATION/BICAP);  Surgeon: Shirley Friar, MD;  Location: [facility];  Service: Endoscopy;  Laterality: N/A;   INGUINAL HERNIA REPAIR Bilateral left , early 2000s;  right 12/ 2003   inguinal hernia, with mesh   JOINT REPLACEMENT     KNEE ARTHROSCOPY Bilateral right- last one 1996;  left , last one ,yrs ago   KNEE SURGERY Left 1972   open   LEFT HEART CATHETERIZATION WITH CORONARY ANGIOGRAM N/A 05/01/2013   Procedure: LEFT HEART CATHETERIZATION WITH CORONARY ANGIOGRAM;  Surgeon: Lesleigh Noe,  MD;  Location: [facility] CATH LAB;  Service: Cardiovascular;  Laterality: N/A;   MOHS SURGERY  yrs ago   nose   PARATHYROIDECTOMY Right 08/19/2023   Procedure: RIGHT INFERIOR PARATHYROIDECTOMY;  Surgeon: Darnell Level, MD;  Location: [facility];  Service: General;  Laterality: Right;   RECONSTRUCTION OF NOSE  1968   TOTAL KNEE ARTHROPLASTY Left 09/16/2018   Procedure: LEFT TOTAL KNEE ARTHROPLASTY;  Surgeon: Durene Romans, MD;  Location: [facility];  Service: Orthopedics;  Laterality: Left;  70 mins   TOTAL KNEE ARTHROPLASTY Right 04/28/2019   Procedure: TOTAL KNEE ARTHROPLASTY;  Surgeon: Durene Romans, MD;  Location: [facility];  Service: Orthopedics;  Laterality: Right;  70 mins    FAMILY HISTORY: Family History  Problem Relation Age of Onset   Heart disease Father    Hypertension Mother    Stroke Mother     SOCIAL HISTORY: Social History   Socioeconomic History   Marital status: Married    Spouse name: Joy   Number of children: 2   Years of education: Not on file   Highest education level: Not on file  Occupational History   Occupation: Ecologist    Comment: retired  Tobacco Use   Smoking status: Never    Passive exposure: Never   Smokeless tobacco: Never  Vaping Use   Vaping status: Never Used  Substance and Sexual Activity   Alcohol use: No   Drug use: Never   Sexual activity: Not on file  Other Topics Concern   Not on file  Social History Narrative   Patient lives with wife.    Social Drivers of Corporate investment banker Strain: Not on file  Food Insecurity: Low Risk  (09/02/2023)   Received from [facility]   Hunger Vital Sign    Worried About Running Out of Food in the Last Year: Never true    Ran Out of Food in the Last Year: Never true  Transportation Needs: No Transportation Needs (09/02/2023)   Received from [facility]    In the past 12 months, has  lack of reliable transportation kept you from medical appointments, meetings, work or  from getting things needed for daily living? : No  Physical Activity: Not on file  Stress: Not on file  Social Connections: Not on file  Intimate Partner Violence: Not on file     PHYSICAL EXAM  GENERAL EXAM/CONSTITUTIONAL: Vitals:  Vitals:   11/22/23 0902  BP: (!) 154/69  Pulse: (!) 58  Weight: 157 lb 6.4 oz (71.4 kg)  Height: 5\' 7"  (1.702 m)   Body mass index is 24.65 kg/m. Wt Readings from Last 3 Encounters:  11/22/23 157 lb 6.4 oz (71.4 kg)  08/19/23 152 lb (68.9 kg)  08/09/23 152 lb (68.9 kg)   Patient is in no distress; well developed, nourished and groomed; neck is supple  CARDIOVASCULAR: Examination of carotid arteries is normal; no carotid bruits Regular rate and rhythm, no murmurs Examination of peripheral vascular system by observation and palpation is normal  EYES: Ophthalmoscopic exam of optic discs and posterior segments is normal; no papilledema or hemorrhages No results found.  MUSCULOSKELETAL: Gait, strength, tone, movements noted in Neurologic exam below  NEUROLOGIC: MENTAL STATUS:      No data to display         awake, alert, oriented to person, place and time recent and remote memory intact normal attention and concentration language fluent, comprehension intact, naming intact fund of knowledge appropriate  CRANIAL NERVE:  2nd - no papilledema on fundoscopic exam 2nd, 3rd, 4th, 6th - pupils equal and reactive to light, visual fields full to confrontation, extraocular muscles intact, no nystagmus 5th - facial sensation symmetric 7th - facial strength symmetric 8th - hearing intact 9th - palate elevates symmetrically, uvula midline 11th - shoulder shrug symmetric 12th - tongue protrusion midline  MOTOR:  normal bulk and tone, full strength in the BUE, BLE  SENSORY:  normal and symmetric to light touch, pinprick, temperature, vibration; EXCEPT DEC PP IN RIGHT HAND DIGIT 5  COORDINATION:  finger-nose-finger, fine finger movements  normal  REFLEXES:  deep tendon reflexes 1+ and symmetric  GAIT/STATION:  narrow based gait     DIAGNOSTIC DATA (LABS, IMAGING, TESTING) - I reviewed patient records, labs, notes, testing and imaging myself where available.  Lab Results  Component Value Date   WBC 6.5 08/09/2023   HGB 16.2 08/09/2023   HCT 48.6 08/09/2023   MCV 100.0 08/09/2023   PLT 161 08/09/2023      Component Value Date/Time   NA 140 08/09/2023 1009   NA 144 05/12/2021 1250   K 4.1 08/09/2023 1009   CL 104 08/09/2023 1009   CO2 29 08/09/2023 1009   GLUCOSE 123 (H) 08/09/2023 1009   BUN 20 08/09/2023 1009   BUN 16 05/12/2021 1250   CREATININE 1.28 (H) 08/09/2023 1009   CALCIUM 10.2 08/09/2023 1009   PROT 6.5 10/20/2021 1125   ALBUMIN 4.1 10/20/2021 1125   AST 27 10/20/2021 1125   ALT 24 10/20/2021 1125   ALKPHOS 69 10/20/2021 1125   BILITOT 0.8 10/20/2021 1125   GFRNONAA 58 (L) 08/09/2023 1009   GFRAA 50 (L) 06/23/2020 1212   Lab Results  Component Value Date   CHOL 129 10/20/2021   HDL 39 (L) 10/20/2021   LDLCALC 67 10/20/2021   TRIG 126 10/20/2021   CHOLHDL 3.3 10/20/2021   No results found for: "HGBA1C" No results found for: "VITAMINB12" No results found for: "TSH"  04/25/22 MRI cervical spine [I reviewed images myself and agree with interpretation. -VRP]  1.  C7-T1 right foraminal extrusion with marked C8 impingement. 2. Disc narrowing and degenerative ridging cause less pronounced foraminal impingement on the symptomatic right side at C3-4 and C6-7. 3. Degenerative left foraminal impingement at C4-5. 4. Diffusely patent spinal canal. A small left paracentral protrusion at C4-5 indents the left ventral cord.  04/25/22 EMG/NCS [Dr. 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 year old male here with:  Dx:  1. Numbness and tingling in right hand    2. Right cervical radiculopathy     PLAN:  Right 5th digit numbness --> RIGHT CERVICAL (C8) RADICULOPATHY (since May 2023; improved; mild residual right 5th digit numbness; also mild right median and ulnar neuropathies, not otherwise localized) - no weakness; mild symptoms; continue conservative mgmt - consider additional OT evaluation if symptoms worsen  Return for return to PCP, pending if symptoms worsen or fail to improve.    Suanne Marker, MD 11/22/2023, 9:38 AM Certified in Neurology, Neurophysiology and Neuroimaging  [facility] [address] [place], [place] [address] [phone]

## 2023-12-26 ENCOUNTER — Telehealth: Payer: Self-pay | Admitting: Radiation Oncology

## 2023-12-26 NOTE — Telephone Encounter (Signed)
LVM on pt's home phone. Spoke to pt after calling mobile number. Able to schedule consult with pt next available. All questions answered.

## 2023-12-30 ENCOUNTER — Encounter: Payer: Self-pay | Admitting: Urology

## 2023-12-30 DIAGNOSIS — C61 Malignant neoplasm of prostate: Secondary | ICD-10-CM | POA: Insufficient documentation

## 2023-12-30 NOTE — Progress Notes (Signed)
GU Location of Tumor / Histology: Prostate Ca  If Prostate Cancer, Gleason Score is (4 + 3) and PSA is (13.20 on 09/20/2023)  Edyth Gunnels Mullally presented as referral from Dr. Jerilee Field [facility] office with elevated PSA, GG score.  Biopsies     10/07/2023 Dr. Jerilee Field MR Prostate with/without Contrast CLINICAL DATA: Elevated PSA level. R97.20   IMPRESSION: 1. PI-RADS category 4 lesion of the right posterolateral peripheral zone in the mid gland and apex. Targeting data sent to UroNAV. 2. Prostatomegaly and benign prostatic hypertrophy.  Prior UroLift. 3. Trace free pelvic fluid, etiology uncertain.  Past/Anticipated interventions by urology, if any: NA  Past/Anticipated interventions by medical oncology, if any: NA  Weight changes, if any: No  IPSS:  9 SHIM:  Uses Cialis 18  Bowel/Bladder complaints, if any:  No  Nausea/Vomiting, if any: No  Pain issues, if any:  0/10  SAFETY ISSUES: Prior radiation? No Pacemaker/ICD? No Possible current pregnancy? Male Is the patient on methotrexate? No  Current Complaints / other details:

## 2023-12-31 ENCOUNTER — Ambulatory Visit
Admission: RE | Admit: 2023-12-31 | Discharge: 2023-12-31 | Disposition: A | Discharge status: Home / Self Care | Payer: Medicare Other | Source: Ambulatory Visit | Attending: Radiation Oncology | Admitting: Radiation Oncology

## 2023-12-31 ENCOUNTER — Encounter: Payer: Self-pay | Admitting: Radiation Oncology

## 2023-12-31 VITALS — BP 154/75 | HR 57 | Temp 97.8°F | Resp 18 | Ht 67.0 in | Wt 158.2 lb

## 2023-12-31 DIAGNOSIS — M109 Gout, unspecified: Secondary | ICD-10-CM | POA: Diagnosis not present

## 2023-12-31 DIAGNOSIS — Z85828 Personal history of other malignant neoplasm of skin: Secondary | ICD-10-CM | POA: Diagnosis not present

## 2023-12-31 DIAGNOSIS — K219 Gastro-esophageal reflux disease without esophagitis: Secondary | ICD-10-CM | POA: Insufficient documentation

## 2023-12-31 DIAGNOSIS — Z7982 Long term (current) use of aspirin: Secondary | ICD-10-CM | POA: Diagnosis not present

## 2023-12-31 DIAGNOSIS — Z79899 Other long term (current) drug therapy: Secondary | ICD-10-CM | POA: Insufficient documentation

## 2023-12-31 DIAGNOSIS — M1990 Unspecified osteoarthritis, unspecified site: Secondary | ICD-10-CM | POA: Diagnosis not present

## 2023-12-31 DIAGNOSIS — E7800 Pure hypercholesterolemia, unspecified: Secondary | ICD-10-CM | POA: Diagnosis not present

## 2023-12-31 DIAGNOSIS — Z860101 Personal history of adenomatous and serrated colon polyps: Secondary | ICD-10-CM | POA: Diagnosis not present

## 2023-12-31 DIAGNOSIS — C61 Malignant neoplasm of prostate: Secondary | ICD-10-CM | POA: Insufficient documentation

## 2023-12-31 DIAGNOSIS — M5030 Other cervical disc degeneration, unspecified cervical region: Secondary | ICD-10-CM | POA: Diagnosis not present

## 2023-12-31 DIAGNOSIS — I10 Essential (primary) hypertension: Secondary | ICD-10-CM | POA: Diagnosis not present

## 2023-12-31 DIAGNOSIS — N400 Enlarged prostate without lower urinary tract symptoms: Secondary | ICD-10-CM | POA: Diagnosis not present

## 2023-12-31 NOTE — Progress Notes (Signed)
Radiation Oncology         [phone] ________________________________  Initial Outpatient Consultation  Name: Dale [name] MRN: [id number]  Date: 12/31/2023  DOB: 10/08/1946  XB:JYNW, Darlen Round, MD  Jerilee Field, MD   REFERRING PHYSICIAN: Jerilee Field, MD  DIAGNOSIS: 78 y.o. gentleman with Stage T1c adenocarcinoma of the prostate with Gleason score of 4+3, and PSA of 13.2.    ICD-10-CM   1. Malignant neoplasm of prostate (HCC)  C61       HISTORY OF PRESENT ILLNESS: Dale [name] is a 78 y.o. male with a diagnosis of prostate cancer. He has been followed by [facility] since at least 2011 for an elevated PSA of 4.17 at that time.  He has also been on testosterone replacement therapy with injections since 2011.  He had previous prostate biopsies in 09/2010 and 10/2017, under the care of Dr. Retta Diones, both of which were negative. He also had a Urolift procedure with Dr. Vanna Scotland in 2021 with improvement in his LUTS but still takes Flomax daily to maintain a strong stream and complete bladder emptying.  He had a prostate MRI on 10/19/2019 that did not show any evidence of high-grade lesions although his PSA had increased to 10.8 at that time.  His PSA remained stable between 9 and 11 for several years, until more recently, when his PSA increased to 13.2 in 09/2023.  A repeat prostate MRI was performed on 10/07/23 showing a PI-RADS 4 lesion of right posterolateral peripheral zone in the mid gland and apex.  He stopped the testosterone replacement therapy in November 2024 and proceeded to MRI fusion biopsy of the prostate on 12/16/23.  The prostate volume measured 145.07 cc.  Out of 15 core biopsies, 2 were positive.  The maximum Gleason score was 4+3, and this was seen in the right mid and 1 of 3 cores from the MRI ROI (with perineural invasion).  The patient reviewed the biopsy results with his urologist and he has kindly been referred today for discussion  of potential radiation treatment options.   PREVIOUS RADIATION THERAPY: No  PAST MEDICAL HISTORY:  Past Medical History:  Diagnosis Date   Allergic rhinitis    Anxiety    BPH (benign prostatic hyperplasia)    DDD (degenerative disc disease), cervical    Depression    ED (erectile dysfunction)    Exertional angina [facility])    cardiologist-  dr Verdis Prime---  04-21-2013 abnormal myoview with epicardial coronaries ;  per cardiac cath 05-01-2013 no sig. obstructive CAD (LAD calcification, diffuse plaqueing) ;  evidence dRCA and LV branch vasoconstriction   GERD (gastroesophageal reflux disease)    Gout 06/2020   recent episode a few weeks ago   History of adenomatous polyp of colon    History of basal cell carcinoma (BCC) excision    History of blood transfusion    History of squamous cell carcinoma excision    09/ 27/ 2019  right lower leg    HTN (hypertension)    Hypercholesteremia    Hypogonadism in male    OA (osteoarthritis)    knees, lumbar, fingers   Seasonal allergies    Skin cancer, basal cell    Systolic murmur       PAST SURGICAL HISTORY: Past Surgical History:  Procedure Laterality Date   APPENDECTOMY  age 42   CARDIAC CATHETERIZATION     no stent   CATARACT EXTRACTION W/ INTRAOCULAR LENS  IMPLANT, BILATERAL  07/2018   COLONOSCOPY N/A  03/02/2014   Procedure: COLONOSCOPY;  Surgeon: Shirley Friar, MD;  Location: [facility];  Service: Endoscopy;  Laterality: N/A;   CYSTOSCOPY WITH INSERTION OF UROLIFT N/A 07/04/2020   Procedure: CYSTOSCOPY WITH INSERTION OF UROLIFT;  Surgeon: Vanna Scotland, MD;  Location: [facility];  Service: Urology;  Laterality: N/A;   CYSTOSCOPY WITH URETHRAL DILATATION N/A 06/27/2020   Procedure: CYSTOSCOPY WITH URETHRAL DILATATION;  Surgeon: Vanna Scotland, MD;  Location: [facility];  Service: Urology;  Laterality: N/A;   EYE SURGERY Bilateral    cataract    FINGER SURGERY     11-14-2004 right long finger;  12-19-2007 left index finger  (both done by dr sypher)   HOT HEMOSTASIS N/A 03/02/2014   Procedure: HOT HEMOSTASIS (ARGON PLASMA COAGULATION/BICAP);  Surgeon: Shirley Friar, MD;  Location: [facility];  Service: Endoscopy;  Laterality: N/A;   INGUINAL HERNIA REPAIR Bilateral left , early 2000s;  right 12/ 2003   inguinal hernia, with mesh   JOINT REPLACEMENT     KNEE ARTHROSCOPY Bilateral right- last one 1996;  left , last one ,yrs ago   KNEE SURGERY Left 1972   open   LEFT HEART CATHETERIZATION WITH CORONARY ANGIOGRAM N/A 05/01/2013   Procedure: LEFT HEART CATHETERIZATION WITH CORONARY ANGIOGRAM;  Surgeon: Lesleigh Noe, MD;  Location: [facility] LAB;  Service: Cardiovascular;  Laterality: N/A;   MOHS SURGERY  yrs ago   nose   PARATHYROIDECTOMY Right 08/19/2023   Procedure: RIGHT INFERIOR PARATHYROIDECTOMY;  Surgeon: Darnell Level, MD;  Location: [facility];  Service: General;  Laterality: Right;   RECONSTRUCTION OF NOSE  1968   TOTAL KNEE ARTHROPLASTY Left 09/16/2018   Procedure: LEFT TOTAL KNEE ARTHROPLASTY;  Surgeon: Durene Romans, MD;  Location: [facility];  Service: Orthopedics;  Laterality: Left;  70 mins   TOTAL KNEE ARTHROPLASTY Right 04/28/2019   Procedure: TOTAL KNEE ARTHROPLASTY;  Surgeon: Durene Romans, MD;  Location: [facility];  Service: Orthopedics;  Laterality: Right;  70 mins    FAMILY HISTORY:  Family History  Problem Relation Age of Onset   Heart disease Father    Hypertension Mother    Stroke Mother     SOCIAL HISTORY:  Social History   Socioeconomic History   Marital status: Married    Spouse name: Joy   Number of children: 2   Years of education: Not on file   Highest education level: Not on file  Occupational History   Occupation: Ecologist    Comment: retired  Tobacco Use   Smoking status: Never    Passive exposure: Never   Smokeless tobacco: Never  Vaping Use   Vaping status: Never Used  Substance and Sexual Activity   Alcohol use: No   Drug use: Never   Sexual  activity: Not on file  Other Topics Concern   Not on file  Social History Narrative   Patient lives with wife.    Social Drivers of Corporate investment banker Strain: Not on file  Food Insecurity: Low Risk  (09/02/2023)   Received from [facility]   Hunger Vital Sign    Worried About Running Out of Food in the Last Year: Never true    Ran Out of Food in the Last Year: Never true  Transportation Needs: No Transportation Needs (09/02/2023)   Received from [facility]    In the past 12 months, has lack of reliable transportation kept you from medical appointments, meetings, work or from getting things  needed for daily living? : No  Physical Activity: Not on file  Stress: Not on file  Social Connections: Not on file  Intimate Partner Violence: Not on file    ALLERGIES: Latex, Penicillamine, Penicillins, and Pneumococcal vaccines  MEDICATIONS:  Current Outpatient Medications  Medication Sig Dispense Refill   hydrocortisone (ANUSOL-HC) 25 MG suppository Place 25 mg rectally daily as needed.     alendronate (FOSAMAX) 70 MG tablet Take 70 mg by mouth once a week. Take with a full glass of water on an empty stomach.     allopurinol (ZYLOPRIM) 300 MG tablet Take 450 mg by mouth daily.     ascorbic acid (VITAMIN C) 500 MG tablet Take 500 mg by mouth daily.     aspirin EC 81 MG tablet Take 81 mg by mouth daily. Swallow whole.     Baclofen 5 MG TABS Take 5 mg by mouth daily as needed (back pain).     Calcium Carbonate Antacid (TUMS E-X PO) Take 2 tablets by mouth daily as needed (heartburn).      cholecalciferol (VITAMIN D) 1000 units tablet Take 1,000 Units by mouth daily.      clonazePAM (KLONOPIN) 0.5 MG tablet Take 0.25 mg by mouth daily as needed for anxiety.     cyanocobalamin (VITAMIN B12) 1000 MCG tablet Take 1,000 mcg by mouth daily.     diclofenac Sodium (VOLTAREN) 1 % GEL Apply 2 g topically daily as needed (pain).     fluticasone (FLONASE) 50 MCG/ACT nasal  spray Place 1 spray into the nose daily.     hydrocortisone cream 1 % Apply 1 application topically daily as needed for itching.   2   ibuprofen (ADVIL) 200 MG tablet Take 400 mg by mouth every 6 (six) hours as needed for moderate pain.     MAGNESIUM GLUCONATE PO Take 200 mg by mouth.     metoprolol succinate (TOPROL-XL) 25 MG 24 hr tablet Take 1 tablet (25 mg total) by mouth daily. 90 tablet 3   pantoprazole (PROTONIX) 40 MG tablet Take 40 mg by mouth at bedtime as needed (heartburn).     Polyethyl Glyc-Propyl Glyc PF (SYSTANE PRESERVATIVE FREE) 0.4-0.3 % SOLN Place 1 drop into both eyes daily. lubricating eye drops     rosuvastatin (CRESTOR) 5 MG tablet Take 1 tablet (5 mg total) by mouth daily. 90 tablet 3   tadalafil (CIALIS) 20 MG tablet Take 20 mg by mouth daily as needed for erectile dysfunction.     tamsulosin (FLOMAX) 0.4 MG CAPS capsule Take 0.4 mg by mouth daily.     traMADol (ULTRAM) 50 MG tablet Take 50 mg by mouth every 6 (six) hours as needed for moderate pain.     No current facility-administered medications for this encounter.    REVIEW OF SYSTEMS:  On review of systems, the patient reports that he is doing well overall. He denies any chest pain, shortness of breath, cough, fevers, chills, night sweats, unintended weight changes. He denies any bowel disturbances, and denies abdominal pain, nausea or vomiting. He denies any new musculoskeletal or joint aches or pains. His IPSS was 9, indicating moderate urinary symptoms despite taking Flomax daily and prior Urolift procedure. His SHIM was 18, indicating his erectile dysfunction is well managed with Cialis. A complete review of systems is obtained and is otherwise negative.    PHYSICAL EXAM:  Wt Readings from Last 3 Encounters:  12/31/23 158 lb 3.2 oz (71.8 kg)  11/22/23 157 lb 6.4 oz (71.4  kg)  08/19/23 152 lb (68.9 kg)   Temp Readings from Last 3 Encounters:  12/31/23 97.8 F (36.6 C)  08/19/23 98.2 F (36.8 C) (Temporal)   08/09/23 98 F (36.7 C) (Oral)   BP Readings from Last 3 Encounters:  12/31/23 (!) 154/75  11/22/23 (!) 154/69  08/19/23 (!) 144/91   Pulse Readings from Last 3 Encounters:  12/31/23 (!) 57  11/22/23 (!) 58  08/19/23 62    /10  In general this is a well appearing Caucasian male in no acute distress. He's alert and oriented x4 and appropriate throughout the examination. Cardiopulmonary assessment is negative for acute distress, and he exhibits normal effort.     KPS = 100  100 - Normal; no complaints; no evidence of disease. 90   - Able to carry on normal activity; minor signs or symptoms of disease. 80   - Normal activity with effort; some signs or symptoms of disease. 31   - Cares for self; unable to carry on normal activity or to do active work. 60   - Requires occasional assistance, but is able to care for most of his personal needs. 50   - Requires considerable assistance and frequent medical care. 40   - Disabled; requires special care and assistance. 30   - Severely disabled; hospital admission is indicated although death not imminent. 20   - Very sick; hospital admission necessary; active supportive treatment necessary. 10   - Moribund; fatal processes progressing rapidly. 0     - Dead  Karnofsky DA, Abelmann WH, Craver LS and Burchenal [facility] [phone]) The use of the nitrogen mustards in the palliative treatment of carcinoma: with particular reference to bronchogenic carcinoma Cancer 1 634-56  LABORATORY DATA:  Lab Results  Component Value Date   WBC 6.5 08/09/2023   HGB 16.2 08/09/2023   HCT 48.6 08/09/2023   MCV 100.0 08/09/2023   PLT 161 08/09/2023   Lab Results  Component Value Date   NA 140 08/09/2023   K 4.1 08/09/2023   CL 104 08/09/2023   CO2 29 08/09/2023   Lab Results  Component Value Date   ALT 24 10/20/2021   AST 27 10/20/2021   ALKPHOS 69 10/20/2021   BILITOT 0.8 10/20/2021     RADIOGRAPHY: No results found.    IMPRESSION/PLAN: 1. 78 y.o.  gentleman with Stage T1c adenocarcinoma of the prostate with Gleason Score of 4+3, and PSA of 13.2. We discussed the patient's workup and outlined the nature of prostate cancer in this setting. The patient's T stage, Gleason's score, and PSA put him into the unfavorable intermediate risk group. Accordingly, he is eligible for a variety of potential treatment options including brachytherapy, 5.5 weeks of external radiation, or prostatectomy. We discussed the available radiation techniques, and focused on the details and logistics of delivery. The patient is not a candidate for brachytherapy with a prostate volume of 147 cc.  Therefore, we discussed and outlined the risks, benefits, short and long-term effects associated with daily external beam radiotherapy and compared and contrasted these with prostatectomy. We discussed the role of SpaceOAR gel in reducing the rectal toxicity associated with radiotherapy. We also detailed the role of ADT in the treatment of unfavorable intermediate risk prostate cancer and outlined the associated side effects that could be expected with this therapy.  We would advocate for a 3 to 15-month course of ADT concurrent with radiation as this will improve the efficacy of treatment and also help to downsize the prostate and  likely make the side effects of radiotherapy more tolerable.  He appears to have a good understanding of his disease and our treatment recommendations which are of curative intent.  He was encouraged to ask questions that were answered to his stated satisfaction.  At the conclusion of our conversation, the patient is interested in moving forward with ST-ADT concurrent with 5.5 weeks of external beam therapy. We will share our discussion with Dr. Mena Goes and make arrangements for the start of ADT, first available.  We will also arrange for fiducial markers and SpaceOAR gel placement in March 2025, prior to simulation, to reduce rectal toxicity from radiotherapy. The  patient appears to have a good understanding of his disease and our treatment recommendations which are of curative intent and is in agreement with the stated plan.  Therefore, we will move forward with treatment planning accordingly, in anticipation of beginning IMRT approximately 2 months after starting ADT.  We enjoyed meeting him and his wife today and look forward to continuing to participate in his care. .  We personally spent 75 minutes in this encounter including chart review, reviewing radiological studies, meeting face-to-face with the patient, entering orders and completing documentation.    Marguarite Arbour, PA-C    Margaretmary Dys, MD  [facility]  Radiation Oncology Direct Dial: [phone]  Fax: [phone] [email or web address]  Skype  LinkedIn   This document serves as a record of services personally performed by Margaretmary Dys, MD and Marcello Fennel, PA-C. It was created on their behalf by Mickie Bail, a trained medical scribe. The creation of this record is based on the scribe's personal observations and the provider's statements to them. This document has been checked and approved by the attending provider.

## 2024-01-03 NOTE — Progress Notes (Signed)
Introduced myself to the patient, and his wife, as the prostate nurse navigator. He is here to discuss his radiation treatment options and will proceed with ADT followed by daily radiation.  I gave him my business card and asked him to call me with questions or concerns.  Verbalized understanding.

## 2024-01-06 ENCOUNTER — Encounter (HOSPITAL_BASED_OUTPATIENT_CLINIC_OR_DEPARTMENT_OTHER): Payer: Self-pay

## 2024-01-10 NOTE — Progress Notes (Signed)
 Patient started Orgovyx today, 2/7.  RN spoke with patient and he is aware we will coordinate for fiducial marker's, spaceOAR, and CT Simulation in approximately 2 months.   Plan of care in progress.

## 2024-01-13 NOTE — Telephone Encounter (Signed)
**Note De-identified  [place] Obfuscation** Please advise 

## 2024-01-23 ENCOUNTER — Other Ambulatory Visit: Payer: Self-pay | Admitting: Urology

## 2024-01-23 ENCOUNTER — Telehealth: Payer: Self-pay | Admitting: *Deleted

## 2024-01-23 NOTE — Telephone Encounter (Signed)
CALLED PATIENT TO INFORM OF FID. MARKER AND SPACE OAR PLACEMENT ON 02-28-24 AND HIS SIM ON 03-03-24 - ARRIVAL TIME- 9:45 AM @ [facility], LVM FOR A RETURN CALL

## 2024-01-29 ENCOUNTER — Encounter (HOSPITAL_BASED_OUTPATIENT_CLINIC_OR_DEPARTMENT_OTHER): Payer: Self-pay | Admitting: Cardiology

## 2024-01-29 ENCOUNTER — Ambulatory Visit (HOSPITAL_BASED_OUTPATIENT_CLINIC_OR_DEPARTMENT_OTHER): Payer: Medicare Other | Admitting: Cardiology

## 2024-01-29 VITALS — BP 128/72 | HR 49 | Ht 67.0 in | Wt 157.5 lb

## 2024-01-29 DIAGNOSIS — I10 Essential (primary) hypertension: Secondary | ICD-10-CM

## 2024-01-29 DIAGNOSIS — R0789 Other chest pain: Secondary | ICD-10-CM

## 2024-01-29 DIAGNOSIS — E785 Hyperlipidemia, unspecified: Secondary | ICD-10-CM

## 2024-01-29 DIAGNOSIS — I2510 Atherosclerotic heart disease of native coronary artery without angina pectoris: Secondary | ICD-10-CM

## 2024-01-29 DIAGNOSIS — I493 Ventricular premature depolarization: Secondary | ICD-10-CM

## 2024-01-29 NOTE — Progress Notes (Signed)
 Cardiology Office Note:  .    Date:  01/29/2024  ID:  Dale [name], DOB 1945-12-27, MRN [id number] PCP: Daisy Floro, MD  [facility] Providers Cardiologist:  Jodelle Red, MD     History of Present Illness: .    Dale [name] is a 78 y.o. male with a hx of chest pain with abnormal nuclear study with inferolateral and apical ischemia, nonobstructive epicardial coronaries, systolic murmur, hypertension, hyperlipidemia, and resting bradycardia. He was a previous patient of Dr. Katrinka Blazing. I initially saw him 03/20/2023 for the evaluation and management of hypertension.    Today: Has been diagnosed with prostate cancer. Started Orgovyx. Qtc within normal range on ECG today. Feels that he is managing in general with this med, but going to discuss some med changes tomorrow with his PCP. Has some unclear symptoms in his abdomen, will discuss with his PCP.  Has had rare palpitations. HR hit 35 bpm when he was on baclofen, usually not that low. Able to exercise with current metoprolol dose, can get HR up with pickleball to 120s-130s.   Walks daily, no exertional symptoms.  Denies chest pain, shortness of breath at rest or with normal exertion. No PND, orthopnea, LE edema or unexpected weight gain. No syncope. ROS otherwise negative except as noted.   ROS:  Please see the history of present illness. ROS otherwise negative except as noted.   Studies Reviewed: Marland Kitchen    EKG Interpretation Date/Time:  Wednesday January 29 2024 14:46:55 EST Ventricular Rate:  49 PR Interval:  196 QRS Duration:  92 QT Interval:  454 QTC Calculation: 410 R Axis:   48  Text Interpretation: Sinus bradycardia When compared with ECG of 30-May-2023 11:32, No significant change was found Confirmed by Jodelle Red [phone]) on 01/29/2024 3:15:09 PM    Physical Exam:    VS:  BP 128/72   Pulse (!) 49   Ht 5\' 7"  (1.702 m)   Wt 157 lb 8 oz (71.4 kg)   SpO2 98%   BMI 24.67 kg/m     Wt Readings from Last 3 Encounters:  01/29/24 157 lb 8 oz (71.4 kg)  12/31/23 158 lb 3.2 oz (71.8 kg)  11/22/23 157 lb 6.4 oz (71.4 kg)    GEN: Well nourished, well developed in no acute distress HEENT: Normal, moist mucous membranes NECK: No JVD CARDIAC: regular rhythm, normal S1 and S2, no rubs or gallops. No murmur. VASCULAR: Radial and DP pulses 2+ bilaterally. No carotid bruits RESPIRATORY:  Clear to auscultation without rales, wheezing or rhonchi  ABDOMEN: Soft, non-tender, non-distended MUSCULOSKELETAL:  Ambulates independently SKIN: Warm and dry, no edema NEUROLOGIC:  Alert and oriented x 3. No focal neuro deficits noted. PSYCHIATRIC:  Normal affect    ASSESSMENT AND PLAN: .    Intermittent lightheadedness Hypertension -symptoms resolved with decreasing metoprolol dose at prior visit -hypertension at goal today, continue metoprolol succinate 25 mg daily   PVCs Intermittent bradycardia -he is asymptomatic with his PVCs. Last burden by monitor 10% -doing well with decreased metoprolol dose. Balance between PVCs and bradycardia -can augment his HR appropriately with exercise   Hyperlipidemia Nonobstructive CAD -continue rosuvastatin 5 mg daily -last LDL 57 per [facility]  Prostate cancer, on orgovyx, Qtc WNL today.  Follow up: 6 mos or sooner as needed  Signed, Jodelle Red, MD  Jodelle Red, MD, PhD, [facility]  [facility]  [place]  [facility] & Vascular at [facility] at [facility] [address]  [place], [place] [address] [phone]

## 2024-01-29 NOTE — Patient Instructions (Signed)
 Medication Instructions:  No changes *If you need a refill on your cardiac medications before your next appointment, please call your pharmacy*  Lab Work: No labs If you have labs (blood work) drawn today and your tests are completely normal, you will receive your results only by: MyChart Message (if you have MyChart) OR A paper copy in the mail If you have any lab test that is abnormal or we need to change your treatment, we will call you to review the results.  Testing/Procedures: No testing  Follow-Up: At [facility], you and your health needs are our priority.  As part of our continuing mission to provide you with exceptional heart care, we have created designated Provider Care Teams.  These Care Teams include your primary Cardiologist (physician) and Advanced Practice Providers (APPs -  Physician Assistants and Nurse Practitioners) who all work together to provide you with the care you need, when you need it.  We recommend signing up for the patient portal called "MyChart".  Sign up information is provided on this After Visit Summary.  MyChart is used to connect with patients for Virtual Visits (Telemedicine).  Patients are able to view lab/test results, encounter notes, upcoming appointments, etc.  Non-urgent messages can be sent to your provider as well.   To learn more about what you can do with MyChart, go to [email or web address].    Your next appointment:   6 month(s)  Provider:   Jodelle Red, MD

## 2024-01-31 ENCOUNTER — Ambulatory Visit (HOSPITAL_BASED_OUTPATIENT_CLINIC_OR_DEPARTMENT_OTHER): Payer: Medicare Other | Admitting: Cardiology

## 2024-02-03 NOTE — Progress Notes (Signed)
 RN spoke with patient to review any questions or barriers prior to upcoming radiation treatment.  All questions answered, no additional needs at this time.

## 2024-02-19 ENCOUNTER — Encounter (HOSPITAL_COMMUNITY): Payer: Self-pay

## 2024-02-19 ENCOUNTER — Encounter (HOSPITAL_COMMUNITY): Payer: Self-pay | Admitting: Urology

## 2024-02-19 NOTE — Progress Notes (Addendum)
 Spoke w/ via phone for pre-op interview--- Dale [name] needs dos----   BMP per anesthesia      [name] results------Current EKG in Epic dated 01/29/24. COVID test -----patient states asymptomatic no test needed Arrive at -------0630 NPO after MN NO Solid Food.   Pre-Surgery Ensure or G2:  Med rec completed Medications to take morning of surgery ----- Allopurinol, Klonopin-PRN, Pepcid, Metoprolol, Zoloft and Orgovyx. Diabetic medication -----  GLP1 agonist last dose: GLP1 instructions:  Patient instructed no nail polish to be worn day of surgery Patient instructed to bring photo id and insurance card day of surgery Patient aware to have Driver (ride ) / caregiver    for 24 hours after surgery - [name] Patient Special Instructions -----Fleet enema night before surgery per surgeon instructions. Per pt he will hold ASA for 5 days prior to procedure. Pre-Op special Instructions -----  Patient verbalized understanding of instructions that were given at this phone interview. Patient denies chest pain, sob, fever, cough at the interview.

## 2024-02-28 ENCOUNTER — Ambulatory Visit (HOSPITAL_COMMUNITY)
Admission: RE | Admit: 2024-02-28 | Discharge: 2024-02-28 | Disposition: A | Discharge status: Home / Self Care | Payer: Medicare Other | Source: Ambulatory Visit | Attending: Urology | Admitting: Urology

## 2024-02-28 ENCOUNTER — Encounter (HOSPITAL_COMMUNITY)
Admission: RE | Disposition: A | Discharge status: Home / Self Care | Payer: Self-pay | Source: Ambulatory Visit | Attending: Urology

## 2024-02-28 ENCOUNTER — Encounter (HOSPITAL_COMMUNITY): Payer: Self-pay | Admitting: Urology

## 2024-02-28 ENCOUNTER — Ambulatory Visit (HOSPITAL_COMMUNITY)

## 2024-02-28 DIAGNOSIS — I10 Essential (primary) hypertension: Secondary | ICD-10-CM | POA: Insufficient documentation

## 2024-02-28 DIAGNOSIS — Z87891 Personal history of nicotine dependence: Secondary | ICD-10-CM | POA: Insufficient documentation

## 2024-02-28 DIAGNOSIS — C61 Malignant neoplasm of prostate: Secondary | ICD-10-CM | POA: Insufficient documentation

## 2024-02-28 HISTORY — PX: GOLD SEED IMPLANT: SHX6343

## 2024-02-28 HISTORY — PX: SPACE OAR INSTILLATION: SHX6769

## 2024-02-28 LAB — BASIC METABOLIC PANEL WITH GFR
Anion gap: 8 (ref 5–15)
BUN: 26 mg/dL — ABNORMAL HIGH (ref 8–23)
CO2: 27 mmol/L (ref 22–32)
Calcium: 9.2 mg/dL (ref 8.9–10.3)
Chloride: 106 mmol/L (ref 98–111)
Creatinine, Ser: 1.15 mg/dL (ref 0.61–1.24)
GFR, Estimated: >60 mL/min (ref >60)
Glucose, Bld: 85 mg/dL (ref 70–99)
Potassium: 3.6 mmol/L (ref 3.5–5.1)
Sodium: 141 mmol/L (ref 135–145)

## 2024-02-28 SURGERY — INSERTION, GOLD SEEDS
Anesthesia: Monitor Anesthesia Care | Site: Prostate

## 2024-02-28 MED ORDER — FLEET ENEMA RE ENEM: 1.0000 | ENEMA | Freq: Once | RECTAL | Status: DC

## 2024-02-28 MED ORDER — ONDANSETRON HCL 4 MG/2ML IJ SOLN
INTRAMUSCULAR | Status: DC | PRN
  Administered 2024-02-28: 4 mg via INTRAVENOUS

## 2024-02-28 MED ORDER — CEFAZOLIN SODIUM-DEXTROSE 2-4 GM/100ML-% IV SOLN
2.0000 g | INTRAVENOUS | Status: AC
  Administered 2024-02-28: 2 g via INTRAVENOUS

## 2024-02-28 MED ORDER — ONDANSETRON HCL 4 MG/2ML IJ SOLN: 4.0000 mg | Freq: Once | INTRAMUSCULAR | Status: DC | PRN

## 2024-02-28 MED ORDER — FENTANYL CITRATE (PF) 250 MCG/5ML IJ SOLN
INTRAMUSCULAR | Status: AC
  Filled 2024-02-28: qty 5

## 2024-02-28 MED ORDER — CHLORHEXIDINE GLUCONATE 0.12 % MT SOLN
15.0000 mL | Freq: Once | OROMUCOSAL | Status: AC
  Administered 2024-02-28: 15 mL via OROMUCOSAL

## 2024-02-28 MED ORDER — FENTANYL CITRATE (PF) 100 MCG/2ML IJ SOLN: INTRAMUSCULAR | Status: DC | PRN

## 2024-02-28 MED ORDER — SODIUM CHLORIDE (PF) 0.9 % IJ SOLN
INTRAMUSCULAR | Status: AC
  Filled 2024-02-28: qty 10

## 2024-02-28 MED ORDER — ACETAMINOPHEN 10 MG/ML IV SOLN: 1000.0000 mg | Freq: Once | INTRAVENOUS | Status: DC | PRN

## 2024-02-28 MED ORDER — DEXAMETHASONE SODIUM PHOSPHATE 10 MG/ML IJ SOLN
INTRAMUSCULAR | Status: DC | PRN
  Administered 2024-02-28: 8 mg via INTRAVENOUS

## 2024-02-28 MED ORDER — ORAL CARE MOUTH RINSE: 15.0000 mL | Freq: Once | OROMUCOSAL | Status: AC

## 2024-02-28 MED ORDER — LIDOCAINE 2% (20 MG/ML) 5 ML SYRINGE
INTRAMUSCULAR | Status: AC
  Filled 2024-02-28: qty 5

## 2024-02-28 MED ORDER — CEFAZOLIN SODIUM-DEXTROSE 2-4 GM/100ML-% IV SOLN
INTRAVENOUS | Status: AC
Start: 2024-02-28 — End: 2024-02-28
  Filled 2024-02-28: qty 100

## 2024-02-28 MED ORDER — FENTANYL CITRATE (PF) 100 MCG/2ML IJ SOLN: 25.0000 ug | INTRAMUSCULAR | Status: DC | PRN

## 2024-02-28 MED ORDER — SODIUM CHLORIDE (PF) 0.9 % IJ SOLN
INTRAMUSCULAR | Status: DC | PRN
  Administered 2024-02-28: 10 mL

## 2024-02-28 MED ORDER — DEXAMETHASONE SODIUM PHOSPHATE 10 MG/ML IJ SOLN
INTRAMUSCULAR | Status: AC
  Filled 2024-02-28: qty 1

## 2024-02-28 MED ORDER — PROPOFOL 10 MG/ML IV BOLUS
INTRAVENOUS | Status: AC
  Filled 2024-02-28: qty 20

## 2024-02-28 MED ORDER — ONDANSETRON HCL 4 MG/2ML IJ SOLN
INTRAMUSCULAR | Status: AC
  Filled 2024-02-28: qty 2

## 2024-02-28 MED ORDER — MIDAZOLAM HCL 2 MG/2ML IJ SOLN
INTRAMUSCULAR | Status: AC
  Filled 2024-02-28: qty 2

## 2024-02-28 MED ORDER — CHLORHEXIDINE GLUCONATE 0.12 % MT SOLN
OROMUCOSAL | Status: AC
  Filled 2024-02-28: qty 15

## 2024-02-28 MED ORDER — PROPOFOL 10 MG/ML IV BOLUS
INTRAVENOUS | Status: DC | PRN
  Administered 2024-02-28: 50 mg via INTRAVENOUS
  Administered 2024-02-28: 50 ug/kg/min via INTRAVENOUS

## 2024-02-28 MED ORDER — MIDAZOLAM HCL 2 MG/2ML IJ SOLN
INTRAMUSCULAR | Status: DC | PRN
  Administered 2024-02-28: 1 mg via INTRAVENOUS

## 2024-02-28 MED ORDER — BUPIVACAINE HCL (PF) 0.25 % IJ SOLN
INTRAMUSCULAR | Status: AC
  Filled 2024-02-28: qty 10

## 2024-02-28 MED ORDER — LACTATED RINGERS IV SOLN: INTRAVENOUS | Status: DC

## 2024-02-28 MED ORDER — LIDOCAINE 2% (20 MG/ML) 5 ML SYRINGE
INTRAMUSCULAR | Status: DC | PRN
  Administered 2024-02-28: 60 mg via INTRAVENOUS

## 2024-02-28 MED ORDER — FENTANYL CITRATE (PF) 100 MCG/2ML IJ SOLN
INTRAMUSCULAR | Status: DC | PRN
  Administered 2024-02-28: 25 ug via INTRAVENOUS
  Administered 2024-02-28: 50 ug via INTRAVENOUS

## 2024-02-28 MED ORDER — BUPIVACAINE HCL (PF) 0.25 % IJ SOLN
INTRAMUSCULAR | Status: DC | PRN
Start: 2024-02-28 — End: 2024-02-28
  Administered 2024-02-28: 10 mL

## 2024-02-28 MED ORDER — AMISULPRIDE (ANTIEMETIC) 5 MG/2ML IV SOLN: 10.0000 mg | Freq: Once | INTRAVENOUS | Status: DC | PRN

## 2024-02-28 SURGICAL SUPPLY — 23 items
BLADE CLIPPER SENSICLIP SURGIC (BLADE) ×1 IMPLANT
CNTNR URN SCR LID CUP LEK RST (MISCELLANEOUS) ×1 IMPLANT
COVER BACK TABLE 60X90IN (DRAPES) ×1 IMPLANT
DRSG TEGADERM 4X4.75 (GAUZE/BANDAGES/DRESSINGS) ×1 IMPLANT
DRSG TEGADERM 8X12 (GAUZE/BANDAGES/DRESSINGS) ×1 IMPLANT
GAUZE SPONGE 4X4 12PLY STRL (GAUZE/BANDAGES/DRESSINGS) ×1 IMPLANT
GLOVE BIOGEL PI IND STRL 7.5 (GLOVE) IMPLANT
GLOVE SURG SS PI 7.5 STRL IVOR (GLOVE) IMPLANT
GOWN STRL REUS W/ TWL XL LVL3 (GOWN DISPOSABLE) IMPLANT
IMPL SPACEOAR VUE SYSTEM (Spacer) ×1 IMPLANT
IMPLANT SPACEOAR VUE SYSTEM (Spacer) ×1 IMPLANT
MARKER GOLD PRELOAD 1.2X3 (Urological Implant) ×1 IMPLANT
MARKER SKIN DUAL TIP RULER LAB (MISCELLANEOUS) ×1 IMPLANT
NDL SPNL 22GX3.5 QUINCKE BK (NEEDLE) ×1 IMPLANT
NEEDLE SPNL 22GX3.5 QUINCKE BK (NEEDLE) ×1 IMPLANT
SEED GOLD PRELOAD 1.2X3 (Urological Implant) ×1 IMPLANT
SHEATH ULTRASOUND LF (SHEATH) ×1 IMPLANT
SLEEVE SCD COMPRESS KNEE MED (STOCKING) ×1 IMPLANT
SURGILUBE 2OZ TUBE FLIPTOP (MISCELLANEOUS) ×1 IMPLANT
SYR 10ML LL (SYRINGE) IMPLANT
SYR CONTROL 10ML LL (SYRINGE) ×1 IMPLANT
TOWEL OR 17X24 6PK STRL BLUE (TOWEL DISPOSABLE) ×1 IMPLANT
UNDERPAD 30X36 HEAVY ABSORB (UNDERPADS AND DIAPERS) ×1 IMPLANT

## 2024-02-28 NOTE — Transfer of Care (Signed)
 Immediate Anesthesia Transfer of Care Note  Patient: Dale [name]  Procedure(s) Performed: INSERTION, GOLD SEEDS (Prostate) INJECTION, HYDROGEL SPACER (Prostate)  Patient Location: PACU  Anesthesia Type:MAC  Level of Consciousness: awake and sedated  Airway & Oxygen Therapy: Patient Spontanous Breathing and Patient connected to face mask oxygen  Post-op Assessment: Report given to RN and Post -op Vital signs reviewed and stable  Post vital signs: Reviewed and stable  Last Vitals:  Vitals Value Taken Time  BP 141/68 02/28/24 0934  Temp    Pulse 50 02/28/24 0937  Resp 7 02/28/24 0936  SpO2 97 % 02/28/24 0937  Vitals shown include unfiled device data.  Last Pain:  Vitals:   02/28/24 0759  TempSrc: Oral  PainSc: 0-No pain      Patients Stated Pain Goal: 4 (02/28/24 0759)  Complications: No notable events documented.

## 2024-02-28 NOTE — Anesthesia Preprocedure Evaluation (Addendum)
 Anesthesia Evaluation  Patient identified by MRN, date of birth, ID band Patient awake    Reviewed: Allergy & Precautions, NPO status , Patient's Chart, lab work & pertinent test results  Airway Mallampati: II       Dental no notable dental hx.    Pulmonary neg pulmonary ROS   Pulmonary exam normal        Cardiovascular hypertension, Pt. on home beta blockers Normal cardiovascular exam     Neuro/Psych  PSYCHIATRIC DISORDERS Anxiety Depression    negative neurological ROS     GI/Hepatic negative GI ROS, Neg liver ROS,,,  Endo/Other  negative endocrine ROS    Renal/GU negative Renal ROS     Musculoskeletal  (+) Arthritis ,    Abdominal   Peds  Hematology negative hematology ROS (+)   Anesthesia Other Findings PROSTATE CANCER  Reproductive/Obstetrics                             Anesthesia Physical Anesthesia Plan  ASA: 2  Anesthesia Plan: MAC   Post-op Pain Management:    Induction: Intravenous  PONV Risk Score and Plan: 1 and Ondansetron, Dexamethasone, Propofol infusion and Treatment may vary due to age or medical condition  Airway Management Planned: Simple Face Mask  Additional Equipment:   Intra-op Plan:   Post-operative Plan:   Informed Consent: I have reviewed the patients History and Physical, chart, labs and discussed the procedure including the risks, benefits and alternatives for the proposed anesthesia with the patient or authorized representative who has indicated his/her understanding and acceptance.     Dental advisory given  Plan Discussed with: CRNA  Anesthesia Plan Comments:        Anesthesia Quick Evaluation

## 2024-02-28 NOTE — Anesthesia Postprocedure Evaluation (Signed)
 Anesthesia Post Note  Patient: Dale [name]  Procedure(s) Performed: INSERTION, GOLD SEEDS (Prostate) INJECTION, HYDROGEL SPACER (Prostate)     Patient location during evaluation: PACU Anesthesia Type: MAC Level of consciousness: awake Pain management: pain level controlled Vital Signs Assessment: post-procedure vital signs reviewed and stable Respiratory status: spontaneous breathing, nonlabored ventilation and respiratory function stable Cardiovascular status: blood pressure returned to baseline and stable Postop Assessment: no apparent nausea or vomiting Anesthetic complications: no   No notable events documented.  Last Vitals:  Vitals:   02/28/24 0957 02/28/24 1004  BP:    Pulse: (!) 48 (!) 47  Resp: 12 15  Temp:  36.7 C  SpO2: 98% 97%    Last Pain:  Vitals:   02/28/24 0759  TempSrc: Oral  PainSc: 0-No pain                 Alexiya Franqui P Sherman Lipuma

## 2024-02-28 NOTE — Discharge Instructions (Signed)

## 2024-02-28 NOTE — H&P (Signed)
 Office Visit Report     02/19/2024   --------------------------------------------------------------------------------   Dale [name]  MRN: [id number]  DOB: October 15, 1946, 78 year old Male  PRIMARY CARE:  C Duane Lope, MD  PRIMARY CARE FAX:  [phone]  REFERRING:  Doyne Keel, MD  PROVIDER:  Jerilee Field, M.D.  TREATING:  Bartholomew Crews, NP  LOCATION:  [facility] [phone]     --------------------------------------------------------------------------------   CC/HPI: F/u -   1) PSA elevation - A 2011 TRUS/Bx. PSA 4.17. Volume 37 ml. PSAD 0.11. Benign with chronic inflammation. PSA rose to 8 in 2018. A PCA 3 was positive at 39. Repeat 2018 TRUS/Bx. PSA 7.92. Prostatic volume 81.7 ml . PSAD 0.096. Benign with 2 showing inflammation, 1 core (right mid lateral) small focus of atypical (right mid lateral).   A 2020 pMRI was benign with a 100 g prostate. PSA rose to 9 (one point) over five years in 2023. His Oct 2023 PSA was 11.2. His October 2024 PSA was 13.2 (PSAD = 0.13), testosterone 772.   2) LOW TESTOSTERONE - He has been on testosterone supplementation w/ weekly injections since 08/2016 with 0.3 ml weekly. AT that time total/free testosterone levels were 177/43. Prolactin levels in 2013 was 6--nml. Oct 2024 Ca 9.3, PTH 23 following PTHectomy with Dr. Gerrit Friends. Hct 49 in Oct 2024. Cr 1.28 in Sep 2024 gfr 55. On testosterone 0.3 - 0.5 ml weekly.   3) BPH - He has a large gland--> 100 mL. Referred to Dr Apolinar Junes for HoLEP in 2021. Procedure initiated, but aborted due to urethral stricture. He was balloon dilated to 55 [place] but the 26 French resectoscope sheath still would not pass in order to accommodate the morcellator. He then underwent Urolift August 2021, but no urethral lesions were noted. On tamsulosin.   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 Dilated with 14 Fr in and out cath and then scoped passed. BPO with Obstructing. Severe  hyperplasia. With an intravesical component. No median lobe. PUL clip visible at [facility] but covered in urothelium.   Today, seen for the above for fusion bx for elevated PSA/MRI lesion. He underwent Nov 2024 pMRI with a 139 g prostate and a 14 mm PIRADS 4 right PZ. Negative staging. PUL clips visible. Oct 2024 PSA 13.2 (psad 0.09). Prior atypia right mid.   He is well today without dysuria or gross hematuria. He is voiding well on tamsulosin. Last T use Nov 2024.   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   02/19/24: Presents with concerns of a hernia. he is worried about starting radiation with a active hernia.     ALLERGIES: Latex Penicillins - Skin Rash, swelling down arm and fever Pneumovax 23 INJ - Anaphylaxis, Skin Rash    MEDICATIONS: Allopurinol 300 MG Tablet 1 tablet PO Daily  Cialis 20 MG Tablet 1 tablet PO prn  Cialis 20 MG Tablet 1/2 to 1 tablet p.o. as needed  Testosterone Cypionate 200 MG/ML Solution 0.3 ml IM Q1WK  Aspirin 81 MG Tablet Delayed Release Oral  Baclofen  BD Plastipak Syringe 21G X 1" 3 ML Miscellaneous 1 syringe IM Q1WK  Clonazepam 1 PO Daily  Colchicine 0.6 MG Tablet  diazePAM 5 MG Tablet take three tabs po once about 30 minutes prior to biopsy  diazePAM 5 MG Tablet take two tabs po prior to procedure/MRI  Flonase 50 MCG/ACT Nasal Suspension 0 Nasal  Fosamax 70 MG Tablet 1 tablet PO Daily  Gabapentin  Lipitor 10 MG Oral Tablet Oral  Lisinopril 20 MG Tablet Oral  Metoprolol Tartrate 25 MG Tablet Oral  Orgovyx 120 MG Tablet 1 tablet PO Daily  predniSONE 10 MG (21) Tablet Therapy Pack  Protonix 40 MG Oral Tablet Delayed Release Oral  traMADol HCl 50 MG Tablet 1 tablet PO Daily  Tums CHEW Oral  Vitamin D  Voltaren 1 % GEL 0 External     GU PSH: Cystoscopy - 10/16/2023 Prostate Needle Biopsy - 12/16/2023, 2018       PSH Notes: Dermatological Surgery, Dermatological Surgery, Appendectomy, Nose Surgery, Knee Surgery, Hand Surgery, Hernia Repair   NON-GU PSH:  Appendectomy - 2011 Cataract surgery, Bilateral Hernia Repair - 2011 Knee Arthroscopy/surgery - 2020 Knee replacement, Left Parathyroidectomy - 08/19/2023 Surgical Pathology, Gross And Microscopic Examination For Prostate Needle - 12/16/2023, 2018 Visit Complexity (formerly GPC1X) - 12/25/2023, 09/26/2023     GU PMH: BPH w/LUTS, He is voiding well on tamsulosin. We did discuss treatment of BPH before or after radiation therapy and how BPH can complicate prostate cancer treatment. We discussed how radical versus BPH directed therapy affects prostate cancer. We discussed some studies on focal therapy with prostate cancer. Some options for him might be aqua ablation, enucleation or PAE. He reports he is voiding well on the tamsulosin and will continue to monitor. - 12/25/2023, We went over the nature risk benefits and alternatives to AQB such as reattempt HoLEP or PAE. Also disc WVTT. We could do WVTTT but he does have an intravesical component. He will consider and proceed with fusion bx. WVTT not a bad idea - would require less urethral dilation. , - 10/16/2023, With history of PUL and urethral stricture he needs a repeat cystoscopy. Will set that up., - 09/26/2023, Overall stable following UroLift, - 09/17/2022 Prostate Cancer, I had a long discussion with the patient and his wife using his path report as a reference. We discussed his stage, grade and prognosis. We discussed the nature risks and benefits of active surveillance, radical prostatectomy, external beam radiotherapy, and brachytherapy. We discussed the role of androgen deprivation in prostate cancer. We discussed antagonist versus agonist. He would like to start Orgovyx if feasible. We also discussed other ablative techniques such as HiFU and cryotherapy as well as whole gland versus focal treatment. We discussed specifically how each treatment might affect bowel, bladder and sexual function. We discussed how each treatment might effect salvage  treatments and active surveillance might lead to progression and more difficult treatment in the future. All questions answered. He would like to proceed with ADT and radiation. Referral to Dr. Kathrynn Running. - 12/25/2023 Weak Urinary Stream - 12/25/2023, (Stable), - 10/16/2023, - 09/26/2023, - 09/17/2022 Elevated PSA, Benign exam. He tolerated well. - 12/16/2023, I drew the patient and his wife a picture of the MRI findings. We went over the nature risk benefits and alternatives to fusion biopsy. We also discussed management of prostate cancer and a 140 g prostate. This would most likely be external beam radiation. We would like to take care of any BPH issues first. He will proceed with biopsy as planned. We are looking for a date. Discussed this is not likely to change quickly over the next few months. Also discussed familial implications., - 10/16/2023, Disc PSA and PSAD. Repeat MRI - check prostate size , - 09/26/2023, Fairly stable PSA curve, normal DRE, negative biopsy in the past, - 03/18/2023, Negative biopsies, large prostate on DRE and MRI., - 09/17/2022 BPH w/o LUTS, Symptoms a  bit worse, back on tamsulosin - 03/18/2023 ED due to arterial insufficiency, On Cialis - 03/18/2023 Pelvic/perineal pain - 2022, - 2022, - 2022, - 2022, - 2022, - 2021, - 2021, - 2021, - 2021, - 2021, - 2021, - 2021, - 2021, - 2021, - 2021 Inflammatory Disease Prostate, Unspec, Prostatitis - 2014 Primary hypogonadism, Hypogonadism, testicular - 2014 Prostate Disorder, Unspec, Prostate disorder - 2014      PMH Notes:  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 - 09/26/2023, Acceptable testosterone levels on repletion, - 03/18/2023 Hypercalcemia, New to patient - 03/18/2023 Muscle weakness (generalized) - 2022, - 2022, - 2022, - 2022, - 2022, - 2021, - 2021, - 2021, - 2021, - 2021, - 2021, - 2021, - 2021, -  2021 Other muscle spasm - 2022, - 2022, - 2022, - 2022, - 2022, - 2021, - 2021, - 2021, - 2021, - 2021, - 2021, - 2021, - 2021, - 2021, - 2021 Other specified disorders of muscle - 2022, - 2021, - 2021 Postural kyphosis, thoracic region - 2022, - 2022, - 2022, - 2022, - 2022, - 2021, - 2021, - 2021, - 2021, - 2021, - 2021, - 2021, - 2021 Encounter for general adult medical examination without abnormal findings, Encounter for preventive health examination - 2014 Personal history of other diseases of the circulatory system, History of hypertension - 2014 Personal history of other diseases of the digestive system, History of esophageal reflux - 2014 Personal history of other endocrine, nutritional and metabolic disease, History of hypercholesterolemia - 2014    FAMILY HISTORY: Death In The Family Father - Father Death In The Family Mother - Mother Family Health Status Number - Runs In Family Marfan Syndrome - Runs In Family Stroke Syndrome - Mother   SOCIAL HISTORY: Marital Status: Married Preferred Language: English; Ethnicity: Not Hispanic Or Latino; Race: White Current Smoking Status: Patient has never smoked.  Does not drink anymore.  Does not use drugs. Does not drink caffeine. Patient's occupation is/was Retired.     Notes: Former smoker, Retired From Work, Marital History - Currently Married, Caffeine Use, Alcohol Use   REVIEW OF SYSTEMS:    GU Review Male:   Patient denies frequent urination, hard to postpone urination, burning/ pain with urination, get up at night to urinate, leakage of urine, stream starts and stops, trouble starting your stream, have to strain to urinate , erection problems, and penile pain.  Gastrointestinal (Upper):   Patient denies nausea, vomiting, and indigestion/ heartburn.  Gastrointestinal (Lower):   Patient denies diarrhea and constipation.  Constitutional:   Patient denies fever, night sweats, weight loss, and fatigue.  Skin:   Patient denies skin  rash/ lesion and itching.  Musculoskeletal:   Patient denies joint pain and back pain.  Neurological:   Patient denies headaches and dizziness.  Psychologic:   Patient denies depression and anxiety.   Notes: groin dscomfort    VITAL SIGNS:      02/19/2024 02:26 PM  BP 132/76 mmHg  Pulse 62 /min  Temperature 98.3 F / 36.8 C   GU PHYSICAL EXAMINATION:      Notes: I was unable to appreciate or palpate a hernia today.     Complexity of Data:  Source Of History:  Patient  Records Review:   Previous Doctor Records, Previous Patient Records  Urine Test Review:   Urinalysis   02/19/24 09/19/23 03/11/23 09/10/22  03/07/22 08/30/21 02/23/21 08/16/20  PSA  Total PSA 1.92 ng/mL 13.20 ng/mL 10.40 ng/mL 11.20 ng/mL 9.31 ng/mL 9.21 ng/mL 9.95 ng/mL 7.51 ng/mL  Free PSA     1.85 ng/mL  2.29 ng/mL   % Free PSA     20 % PSA  23 % PSA     02/19/24 09/19/23 03/11/23 08/30/21 06/03/19 10/17/18 11/02/16 07/24/16  Hormones  Testosterone, Total <10 ng/dL 161.0 ng/dL 960.4 ng/dL 540.9 ng/dL 811.9 ng/dL 1478.2 ng/dL 956.2 pg/dL 130.8 pg/dL    65/78/46 96/29/52  Urinalysis  Urine Appearance Clear  Clear   Urine Color Yellow  Yellow   Urine Glucose Neg mg/dL Neg   Urine Bilirubin Neg mg/dL Neg   Urine Ketones Neg mg/dL Neg   Urine Specific Gravity 1.015  1.015   Urine Blood Neg ery/uL Neg   Urine pH <=5.0  <=5.0   Urine Protein Neg mg/dL Neg   Urine Urobilinogen 0.2 mg/dL 0.2   Urine Nitrites Neg  Neg   Urine Leukocyte Esterase Neg leu/uL Neg    PROCEDURES:         C.T. ABD-Pelv w/o - [id number]      Patient confirmed No Neulasta OnPro Device.         Visit Complexity - G2211          Urinalysis - 81003 Dipstick Dipstick Cont'd  Color: Yellow Bilirubin: Neg  Appearance: Clear Ketones: Neg  Specific Gravity: 1.015 Blood: Neg  pH: <=5.0 Protein: Neg  Glucose: Neg Urobilinogen: 0.2    Nitrites: Neg    Leukocyte Esterase: Neg    Notes:      ASSESSMENT:      ICD-10 Details  1 GU:    Prostate Cancer - C61 Chronic, Stable  2   Weak Urinary Stream - R39.12 Chronic, Stable   PLAN:           Orders X-Rays: C.T. Abdomen/Pelvis Without I.V. Contrast - Worried he has a hernia, no hernie appreciated on exam, about to start radiation  X-Ray Notes: History:   Hematuria: Yes / No   Patient to see MD after exam: Yes/ No   Previous exam:   When:   Where:   Diabetic: Yes / No   BUN/ Creatinine:   Date of last BUN Creatinine:   Weight in pounds:   Allergy- IV Contrast: Yes/ No  Prior Authorization #: [id number]           Schedule         Document Letter(s):  Created for Patient: Clinical Summary         Notes:   ICD keep his upcoming surgery as scheduled for seed implantation and SpaceOAR. CT imaging was not concerning    The risk, benefits and alternatives of gold seed fiducial marker and SpaceOAR placement was discussed with the patient.  Risk include, but are not limited to, bleeding, urinary tract infection, perineal infection, urethral injury, rectal irritation/ulceration, MI, CVA, DVT and the inherent risk of general anesthesia.  He voices understanding and wishes to proceed.

## 2024-02-28 NOTE — Op Note (Signed)
 Operative Note   Preoperative diagnosis:  1.  Grade 3 prostate cancer   Postoperative diagnosis: 1.  Same    Procedure(s): 1.  Transrectal ultrasound guided prostatic gold seed fiducial marker and attempted SpaceOAR placement   Surgeon: Rhoderick Moody, MD   Assistants:  None    Anesthesia:  MAC   Complications:  None   EBL:  <5 mL   Specimens: 1. None   Drains/Catheters: 1.  None   Intraoperative findings:   Fiducial markers were placed at the prostatic base, mid-gland and apex.   The fat plane between the prostate and rectum was too thin to safely access at hydrodissect for SpaceOAR placement   Indication: Mr. Dale [name] is a 78 y.o. male with grade 3 prostate cancer.  He has elected to proceed with XRT as primary treatment and is here today for gold seed fiducial marker and SpaceOAR placement.  He has been consented for the above procedures, voices understanding and wishes to proceed.    Description of procedure:   After informed consent the patient was brought to the major OR, placed on the table and administered general anesthesia. He was then moved to the modified lithotomy position with his perineum perpendicular to the floor. His perineum and genitalia were then sterilely prepped. An official timeout was then performed.    Real time transrectal ultrasonography was used visualize the prostate.  Gold seed fiducial markers were then placed transperineally at the prostatic base, mid gland and apex.   I then proceeded with an attempt at placement of SpaceOAR by introducing a needle with the bevel angled inferiorly approximately 2 cm superior to the anus. This was angled downward and under direct ultrasound was placed within the space between the prostatic capsule and rectum. After hydrodissection, a plane between the prostate and rectum could not safely be identified to adequately inject the SpaceOAR gel.  Therefore SpaceOAR gel placement was aborted.  The patient  tolerated the procedure well and was transferred to the postanesthesia in stable condition.  Plan: Follow-up in 4 months with PSA

## 2024-03-02 ENCOUNTER — Encounter (HOSPITAL_COMMUNITY): Payer: Self-pay | Admitting: Urology

## 2024-03-02 ENCOUNTER — Telehealth: Payer: Self-pay | Admitting: *Deleted

## 2024-03-02 NOTE — Telephone Encounter (Signed)
 CALLED PATIENT TO REMIND OF SIM APPT. FOR 03-03-24- ARRIVAL TIME- 9:45 AM @ [facility], INFORMED PATIENT TO ARRIVE WITH A FULL BLADDER, SPOKE WITH PATIENT AND HE IS AWARE OF THIS APPT. AND THE INSTRUCTIONS

## 2024-03-03 ENCOUNTER — Ambulatory Visit
Admission: RE | Admit: 2024-03-03 | Discharge: 2024-03-03 | Disposition: A | Discharge status: Home / Self Care | Payer: Medicare Other | Source: Ambulatory Visit | Attending: Urology | Admitting: Urology

## 2024-03-03 DIAGNOSIS — R350 Frequency of micturition: Secondary | ICD-10-CM | POA: Diagnosis not present

## 2024-03-03 DIAGNOSIS — Z510 Encounter for antineoplastic radiation therapy: Secondary | ICD-10-CM | POA: Diagnosis present

## 2024-03-03 DIAGNOSIS — C61 Malignant neoplasm of prostate: Secondary | ICD-10-CM | POA: Diagnosis present

## 2024-03-03 NOTE — Progress Notes (Signed)
  Radiation Oncology         [phone] ________________________________  Name: JORDELL OUTTEN MRN: [id number]  Date: 03/03/2024  DOB: 1946/09/18  SIMULATION AND TREATMENT PLANNING NOTE    ICD-10-CM   1. Malignant neoplasm of prostate (HCC)  C61       DIAGNOSIS:  78 y.o. gentleman with Stage T1c adenocarcinoma of the prostate with Gleason score of 4+3, and PSA of 13.2.   NARRATIVE:  The patient was brought to the CT Simulation planning suite.  Identity was confirmed.  All relevant records and images related to the planned course of therapy were reviewed.  The patient freely provided informed written consent to proceed with treatment after reviewing the details related to the planned course of therapy. The consent form was witnessed and verified by the simulation staff.  Then, the patient was set-up in a stable reproducible supine position for radiation therapy.  A vacuum lock pillow device was custom fabricated to position his legs in a reproducible immobilized position.  Then, I performed a urethrogram under sterile conditions to identify the prostatic apex.  CT images were obtained.  Surface markings were placed.  The CT images were loaded into the planning software.  Then the prostate target and avoidance structures including the rectum, bladder, bowel and hips were contoured.  Treatment planning then occurred.  The radiation prescription was entered and confirmed.  A total of one complex treatment devices was fabricated. I have requested : Intensity Modulated Radiotherapy (IMRT) is medically necessary for this case for the following reason:  Rectal sparing.Marland Kitchen  PLAN:  The patient will receive 70 Gy in 28 fractions; concurrent with ST-ADT.  ________________________________  Artist Pais. Kathrynn Running, M.D.

## 2024-03-04 DIAGNOSIS — Z510 Encounter for antineoplastic radiation therapy: Secondary | ICD-10-CM | POA: Diagnosis not present

## 2024-03-12 ENCOUNTER — Other Ambulatory Visit: Payer: Self-pay

## 2024-03-12 ENCOUNTER — Ambulatory Visit
Admission: RE | Admit: 2024-03-12 | Discharge: 2024-03-12 | Discharge status: Home / Self Care | Source: Ambulatory Visit | Attending: Radiation Oncology

## 2024-03-12 DIAGNOSIS — Z510 Encounter for antineoplastic radiation therapy: Secondary | ICD-10-CM | POA: Diagnosis not present

## 2024-03-12 LAB — RAD ONC ARIA SESSION SUMMARY
Course Elapsed Days: 0
Plan Fractions Treated to Date: 1
Plan Prescribed Dose Per Fraction: 2.5 Gy
Plan Total Fractions Prescribed: 28
Plan Total Prescribed Dose: 70 Gy
Reference Point Dosage Given to Date: 2.5 Gy
Reference Point Session Dosage Given: 2.5 Gy
Session Number: 1

## 2024-03-13 ENCOUNTER — Ambulatory Visit
Admission: RE | Admit: 2024-03-13 | Discharge: 2024-03-13 | Disposition: A | Discharge status: Home / Self Care | Source: Ambulatory Visit | Attending: Radiation Oncology | Admitting: Radiation Oncology

## 2024-03-13 ENCOUNTER — Other Ambulatory Visit: Payer: Self-pay

## 2024-03-13 DIAGNOSIS — Z510 Encounter for antineoplastic radiation therapy: Secondary | ICD-10-CM | POA: Diagnosis not present

## 2024-03-13 LAB — RAD ONC ARIA SESSION SUMMARY
Course Elapsed Days: 1
Plan Fractions Treated to Date: 2
Plan Prescribed Dose Per Fraction: 2.5 Gy
Plan Total Fractions Prescribed: 28
Plan Total Prescribed Dose: 70 Gy
Reference Point Dosage Given to Date: 5 Gy
Reference Point Session Dosage Given: 2.5 Gy
Session Number: 2

## 2024-03-16 ENCOUNTER — Other Ambulatory Visit: Payer: Self-pay

## 2024-03-16 ENCOUNTER — Ambulatory Visit
Admission: RE | Admit: 2024-03-16 | Discharge: 2024-03-16 | Disposition: A | Discharge status: Home / Self Care | Source: Ambulatory Visit | Attending: Radiation Oncology | Admitting: Radiation Oncology

## 2024-03-16 DIAGNOSIS — Z510 Encounter for antineoplastic radiation therapy: Secondary | ICD-10-CM | POA: Diagnosis not present

## 2024-03-16 LAB — RAD ONC ARIA SESSION SUMMARY
Course Elapsed Days: 4
Plan Fractions Treated to Date: 3
Plan Prescribed Dose Per Fraction: 2.5 Gy
Plan Total Fractions Prescribed: 28
Plan Total Prescribed Dose: 70 Gy
Reference Point Dosage Given to Date: 7.5 Gy
Reference Point Session Dosage Given: 2.5 Gy
Session Number: 3

## 2024-03-17 ENCOUNTER — Ambulatory Visit
Admission: RE | Admit: 2024-03-17 | Discharge: 2024-03-17 | Disposition: A | Discharge status: Home / Self Care | Source: Ambulatory Visit | Attending: Radiation Oncology

## 2024-03-17 ENCOUNTER — Other Ambulatory Visit: Payer: Self-pay | Admitting: Urology

## 2024-03-17 ENCOUNTER — Telehealth: Payer: Self-pay

## 2024-03-17 ENCOUNTER — Ambulatory Visit
Admission: RE | Admit: 2024-03-17 | Discharge: 2024-03-17 | Disposition: A | Discharge status: Home / Self Care | Source: Ambulatory Visit | Attending: Urology | Admitting: Urology

## 2024-03-17 ENCOUNTER — Other Ambulatory Visit: Payer: Self-pay

## 2024-03-17 DIAGNOSIS — R300 Dysuria: Secondary | ICD-10-CM

## 2024-03-17 DIAGNOSIS — Z510 Encounter for antineoplastic radiation therapy: Secondary | ICD-10-CM | POA: Diagnosis not present

## 2024-03-17 LAB — URINALYSIS, COMPLETE (UACMP) WITH MICROSCOPIC
Bacteria, UA: NONE SEEN
Bilirubin Urine: NEGATIVE
Glucose, UA: NEGATIVE mg/dL
Hgb urine dipstick: NEGATIVE
Ketones, ur: NEGATIVE mg/dL
Leukocytes,Ua: NEGATIVE
Nitrite: POSITIVE — AB
Protein, ur: NEGATIVE mg/dL
Specific Gravity, Urine: 1.008 (ref 1.005–1.030)
pH: 7 (ref 5.0–8.0)

## 2024-03-17 LAB — RAD ONC ARIA SESSION SUMMARY
Course Elapsed Days: 5
Plan Fractions Treated to Date: 4
Plan Prescribed Dose Per Fraction: 2.5 Gy
Plan Total Fractions Prescribed: 28
Plan Total Prescribed Dose: 70 Gy
Reference Point Dosage Given to Date: 10 Gy
Reference Point Session Dosage Given: 2.5 Gy
Session Number: 4

## 2024-03-17 MED ORDER — DOXYCYCLINE HYCLATE 100 MG PO TABS: 100.0000 mg | ORAL_TABLET | Freq: Two times a day (BID) | ORAL | 0 refills | Status: DC

## 2024-03-17 NOTE — Telephone Encounter (Signed)
 RN called patient back per [employer], PA-C to give results of urinalysis.  There is no bacteria on U/A to indicate UTI but with his symptoms this early in his course of radiation.  Prescription of Doxycycline for possible bacterial prostatitis and will adjust antibiotic prn once we have final C&S. Patient was encouraged to continue AZO and Flomax BID to help manage symptoms. May also take Alleve or advil to help with inflammation.  Rx to [facility] [address]. Patient verbalized understanding and will pick up medication and start today.

## 2024-03-17 NOTE — Progress Notes (Signed)
 Patient called in to report symptoms of urinary frequency/urgency, dysuria, pain at head of penis and urethra.  Denies fever, chills, hematuria.  Dale [name] reports taking AZO and Tamsulosin 0.4 mg 1 tab nightly.  Dale [name] was advised to give urine sample to be tested to rule out UTI.  Dale [name] reports will try 2 Tamsulosin tabs to see if that will help alleviate some discomfort with urination.  Advised to increase water intake and continue with AZO.  RN will call Mr. Sahota with results.

## 2024-03-18 ENCOUNTER — Ambulatory Visit
Admission: RE | Admit: 2024-03-18 | Discharge: 2024-03-18 | Disposition: A | Discharge status: Home / Self Care | Source: Ambulatory Visit | Attending: Radiation Oncology | Admitting: Radiation Oncology

## 2024-03-18 ENCOUNTER — Other Ambulatory Visit: Payer: Self-pay

## 2024-03-18 DIAGNOSIS — Z510 Encounter for antineoplastic radiation therapy: Secondary | ICD-10-CM | POA: Diagnosis not present

## 2024-03-18 LAB — RAD ONC ARIA SESSION SUMMARY
Course Elapsed Days: 6
Plan Fractions Treated to Date: 5
Plan Prescribed Dose Per Fraction: 2.5 Gy
Plan Total Fractions Prescribed: 28
Plan Total Prescribed Dose: 70 Gy
Reference Point Dosage Given to Date: 12.5 Gy
Reference Point Session Dosage Given: 2.5 Gy
Session Number: 5

## 2024-03-18 LAB — URINE CULTURE: Culture: NO GROWTH

## 2024-03-19 ENCOUNTER — Other Ambulatory Visit: Payer: Self-pay

## 2024-03-19 ENCOUNTER — Ambulatory Visit
Admission: RE | Admit: 2024-03-19 | Discharge: 2024-03-19 | Disposition: A | Discharge status: Home / Self Care | Source: Ambulatory Visit | Attending: Radiation Oncology | Admitting: Radiation Oncology

## 2024-03-19 DIAGNOSIS — Z510 Encounter for antineoplastic radiation therapy: Secondary | ICD-10-CM | POA: Diagnosis not present

## 2024-03-19 LAB — RAD ONC ARIA SESSION SUMMARY
Course Elapsed Days: 7
Plan Fractions Treated to Date: 6
Plan Prescribed Dose Per Fraction: 2.5 Gy
Plan Total Fractions Prescribed: 28
Plan Total Prescribed Dose: 70 Gy
Reference Point Dosage Given to Date: 15 Gy
Reference Point Session Dosage Given: 2.5 Gy
Session Number: 6

## 2024-03-20 ENCOUNTER — Ambulatory Visit
Admission: RE | Admit: 2024-03-20 | Discharge: 2024-03-20 | Disposition: A | Discharge status: Home / Self Care | Source: Ambulatory Visit | Attending: Radiation Oncology | Admitting: Radiation Oncology

## 2024-03-20 ENCOUNTER — Other Ambulatory Visit: Payer: Self-pay | Admitting: Radiation Oncology

## 2024-03-20 ENCOUNTER — Other Ambulatory Visit: Payer: Self-pay

## 2024-03-20 DIAGNOSIS — Z510 Encounter for antineoplastic radiation therapy: Secondary | ICD-10-CM | POA: Diagnosis not present

## 2024-03-20 LAB — RAD ONC ARIA SESSION SUMMARY
Course Elapsed Days: 8
Plan Fractions Treated to Date: 7
Plan Prescribed Dose Per Fraction: 2.5 Gy
Plan Total Fractions Prescribed: 28
Plan Total Prescribed Dose: 70 Gy
Reference Point Dosage Given to Date: 17.5 Gy
Reference Point Session Dosage Given: 2.5 Gy
Session Number: 7

## 2024-03-20 MED ORDER — PHENAZOPYRIDINE HCL 200 MG PO TABS: 200.0000 mg | ORAL_TABLET | Freq: Three times a day (TID) | ORAL | 5 refills | Status: DC | PRN

## 2024-03-23 ENCOUNTER — Other Ambulatory Visit: Payer: Self-pay

## 2024-03-23 ENCOUNTER — Ambulatory Visit
Admission: RE | Admit: 2024-03-23 | Discharge: 2024-03-23 | Disposition: A | Discharge status: Home / Self Care | Source: Ambulatory Visit | Attending: Radiation Oncology | Admitting: Radiation Oncology

## 2024-03-23 DIAGNOSIS — Z510 Encounter for antineoplastic radiation therapy: Secondary | ICD-10-CM | POA: Diagnosis not present

## 2024-03-23 LAB — RAD ONC ARIA SESSION SUMMARY
Course Elapsed Days: 11
Plan Fractions Treated to Date: 8
Plan Prescribed Dose Per Fraction: 2.5 Gy
Plan Total Fractions Prescribed: 28
Plan Total Prescribed Dose: 70 Gy
Reference Point Dosage Given to Date: 20 Gy
Reference Point Session Dosage Given: 2.5 Gy
Session Number: 8

## 2024-03-24 ENCOUNTER — Other Ambulatory Visit: Payer: Self-pay

## 2024-03-24 ENCOUNTER — Ambulatory Visit
Admission: RE | Admit: 2024-03-24 | Discharge: 2024-03-24 | Disposition: A | Discharge status: Home / Self Care | Source: Ambulatory Visit | Attending: Radiation Oncology | Admitting: Radiation Oncology

## 2024-03-24 DIAGNOSIS — Z510 Encounter for antineoplastic radiation therapy: Secondary | ICD-10-CM | POA: Diagnosis not present

## 2024-03-24 LAB — RAD ONC ARIA SESSION SUMMARY
Course Elapsed Days: 12
Plan Fractions Treated to Date: 9
Plan Prescribed Dose Per Fraction: 2.5 Gy
Plan Total Fractions Prescribed: 28
Plan Total Prescribed Dose: 70 Gy
Reference Point Dosage Given to Date: 22.5 Gy
Reference Point Session Dosage Given: 2.5 Gy
Session Number: 9

## 2024-03-25 ENCOUNTER — Other Ambulatory Visit: Payer: Self-pay

## 2024-03-25 ENCOUNTER — Ambulatory Visit
Admission: RE | Admit: 2024-03-25 | Discharge: 2024-03-25 | Disposition: A | Discharge status: Home / Self Care | Source: Ambulatory Visit | Attending: Radiation Oncology

## 2024-03-25 DIAGNOSIS — Z510 Encounter for antineoplastic radiation therapy: Secondary | ICD-10-CM | POA: Diagnosis not present

## 2024-03-25 LAB — RAD ONC ARIA SESSION SUMMARY
Course Elapsed Days: 13
Plan Fractions Treated to Date: 10
Plan Prescribed Dose Per Fraction: 2.5 Gy
Plan Total Fractions Prescribed: 28
Plan Total Prescribed Dose: 70 Gy
Reference Point Dosage Given to Date: 25 Gy
Reference Point Session Dosage Given: 2.5 Gy
Session Number: 10

## 2024-03-26 ENCOUNTER — Ambulatory Visit
Admission: RE | Admit: 2024-03-26 | Discharge: 2024-03-26 | Disposition: A | Discharge status: Home / Self Care | Source: Ambulatory Visit | Attending: Radiation Oncology | Admitting: Radiation Oncology

## 2024-03-26 ENCOUNTER — Other Ambulatory Visit: Payer: Self-pay

## 2024-03-26 DIAGNOSIS — Z510 Encounter for antineoplastic radiation therapy: Secondary | ICD-10-CM | POA: Diagnosis not present

## 2024-03-26 LAB — RAD ONC ARIA SESSION SUMMARY
Course Elapsed Days: 14
Plan Fractions Treated to Date: 11
Plan Prescribed Dose Per Fraction: 2.5 Gy
Plan Total Fractions Prescribed: 28
Plan Total Prescribed Dose: 70 Gy
Reference Point Dosage Given to Date: 27.5 Gy
Reference Point Session Dosage Given: 2.5 Gy
Session Number: 11

## 2024-03-27 ENCOUNTER — Ambulatory Visit
Admission: RE | Admit: 2024-03-27 | Discharge: 2024-03-27 | Disposition: A | Discharge status: Home / Self Care | Source: Ambulatory Visit | Attending: Radiation Oncology | Admitting: Radiation Oncology

## 2024-03-27 ENCOUNTER — Other Ambulatory Visit: Payer: Self-pay

## 2024-03-27 DIAGNOSIS — Z510 Encounter for antineoplastic radiation therapy: Secondary | ICD-10-CM | POA: Diagnosis not present

## 2024-03-27 LAB — RAD ONC ARIA SESSION SUMMARY
Course Elapsed Days: 15
Plan Fractions Treated to Date: 12
Plan Prescribed Dose Per Fraction: 2.5 Gy
Plan Total Fractions Prescribed: 28
Plan Total Prescribed Dose: 70 Gy
Reference Point Dosage Given to Date: 30 Gy
Reference Point Session Dosage Given: 2.5 Gy
Session Number: 12

## 2024-03-30 ENCOUNTER — Other Ambulatory Visit: Payer: Self-pay

## 2024-03-30 ENCOUNTER — Ambulatory Visit
Admission: RE | Admit: 2024-03-30 | Discharge: 2024-03-30 | Disposition: A | Discharge status: Home / Self Care | Source: Ambulatory Visit | Attending: Radiation Oncology | Admitting: Radiation Oncology

## 2024-03-30 DIAGNOSIS — Z510 Encounter for antineoplastic radiation therapy: Secondary | ICD-10-CM | POA: Diagnosis not present

## 2024-03-30 LAB — RAD ONC ARIA SESSION SUMMARY
Course Elapsed Days: 18
Plan Fractions Treated to Date: 13
Plan Prescribed Dose Per Fraction: 2.5 Gy
Plan Total Fractions Prescribed: 28
Plan Total Prescribed Dose: 70 Gy
Reference Point Dosage Given to Date: 32.5 Gy
Reference Point Session Dosage Given: 2.5 Gy
Session Number: 13

## 2024-03-31 ENCOUNTER — Ambulatory Visit
Admission: RE | Admit: 2024-03-31 | Discharge: 2024-03-31 | Disposition: A | Discharge status: Home / Self Care | Source: Ambulatory Visit | Attending: Radiation Oncology

## 2024-03-31 ENCOUNTER — Other Ambulatory Visit: Payer: Self-pay

## 2024-03-31 DIAGNOSIS — Z510 Encounter for antineoplastic radiation therapy: Secondary | ICD-10-CM | POA: Diagnosis not present

## 2024-03-31 LAB — RAD ONC ARIA SESSION SUMMARY
Course Elapsed Days: 19
Plan Fractions Treated to Date: 14
Plan Prescribed Dose Per Fraction: 2.5 Gy
Plan Total Fractions Prescribed: 28
Plan Total Prescribed Dose: 70 Gy
Reference Point Dosage Given to Date: 35 Gy
Reference Point Session Dosage Given: 2.5 Gy
Session Number: 14

## 2024-04-01 ENCOUNTER — Other Ambulatory Visit: Payer: Self-pay

## 2024-04-01 ENCOUNTER — Ambulatory Visit
Admission: RE | Admit: 2024-04-01 | Discharge: 2024-04-01 | Disposition: A | Discharge status: Home / Self Care | Source: Ambulatory Visit | Attending: Radiation Oncology | Admitting: Radiation Oncology

## 2024-04-01 DIAGNOSIS — Z510 Encounter for antineoplastic radiation therapy: Secondary | ICD-10-CM | POA: Diagnosis not present

## 2024-04-01 LAB — RAD ONC ARIA SESSION SUMMARY
Course Elapsed Days: 20
Plan Fractions Treated to Date: 15
Plan Prescribed Dose Per Fraction: 2.5 Gy
Plan Total Fractions Prescribed: 28
Plan Total Prescribed Dose: 70 Gy
Reference Point Dosage Given to Date: 37.5 Gy
Reference Point Session Dosage Given: 2.5 Gy
Session Number: 15

## 2024-04-02 ENCOUNTER — Ambulatory Visit
Admission: RE | Admit: 2024-04-02 | Discharge: 2024-04-02 | Disposition: A | Discharge status: Home / Self Care | Source: Ambulatory Visit | Attending: Radiation Oncology | Admitting: Radiation Oncology

## 2024-04-02 ENCOUNTER — Other Ambulatory Visit: Payer: Self-pay

## 2024-04-02 DIAGNOSIS — Z510 Encounter for antineoplastic radiation therapy: Secondary | ICD-10-CM | POA: Insufficient documentation

## 2024-04-02 DIAGNOSIS — R351 Nocturia: Secondary | ICD-10-CM | POA: Insufficient documentation

## 2024-04-02 DIAGNOSIS — C61 Malignant neoplasm of prostate: Secondary | ICD-10-CM | POA: Diagnosis present

## 2024-04-02 LAB — RAD ONC ARIA SESSION SUMMARY
Course Elapsed Days: 21
Plan Fractions Treated to Date: 16
Plan Prescribed Dose Per Fraction: 2.5 Gy
Plan Total Fractions Prescribed: 28
Plan Total Prescribed Dose: 70 Gy
Reference Point Dosage Given to Date: 40 Gy
Reference Point Session Dosage Given: 2.5 Gy
Session Number: 16

## 2024-04-03 ENCOUNTER — Encounter: Payer: Self-pay | Admitting: Radiation Oncology

## 2024-04-03 ENCOUNTER — Ambulatory Visit
Admission: RE | Admit: 2024-04-03 | Discharge: 2024-04-03 | Disposition: A | Discharge status: Home / Self Care | Source: Ambulatory Visit | Attending: Radiation Oncology

## 2024-04-03 ENCOUNTER — Ambulatory Visit

## 2024-04-03 ENCOUNTER — Other Ambulatory Visit: Payer: Self-pay | Admitting: Radiation Oncology

## 2024-04-03 ENCOUNTER — Other Ambulatory Visit: Payer: Self-pay

## 2024-04-03 DIAGNOSIS — Z510 Encounter for antineoplastic radiation therapy: Secondary | ICD-10-CM | POA: Diagnosis not present

## 2024-04-03 DIAGNOSIS — R300 Dysuria: Secondary | ICD-10-CM

## 2024-04-03 LAB — URINALYSIS, COMPLETE (UACMP) WITH MICROSCOPIC
Bacteria, UA: NONE SEEN
Bilirubin Urine: NEGATIVE
Glucose, UA: NEGATIVE mg/dL
Hgb urine dipstick: NEGATIVE
Ketones, ur: NEGATIVE mg/dL
Leukocytes,Ua: NEGATIVE
Nitrite: POSITIVE — AB
Protein, ur: NEGATIVE mg/dL
Specific Gravity, Urine: 1.005 (ref 1.005–1.030)
pH: 6 (ref 5.0–8.0)

## 2024-04-03 LAB — RAD ONC ARIA SESSION SUMMARY
Course Elapsed Days: 22
Plan Fractions Treated to Date: 17
Plan Prescribed Dose Per Fraction: 2.5 Gy
Plan Total Fractions Prescribed: 28
Plan Total Prescribed Dose: 70 Gy
Reference Point Dosage Given to Date: 42.5 Gy
Reference Point Session Dosage Given: 2.5 Gy
Session Number: 17

## 2024-04-03 MED ORDER — SULFAMETHOXAZOLE-TRIMETHOPRIM 800-160 MG PO TABS: 1.0000 | ORAL_TABLET | Freq: Two times a day (BID) | ORAL | 0 refills | Status: DC

## 2024-04-03 NOTE — Progress Notes (Signed)
 Please call patient with normal result.  Thanks. MM

## 2024-04-04 LAB — URINE CULTURE: Culture: NO GROWTH

## 2024-04-06 ENCOUNTER — Other Ambulatory Visit: Payer: Self-pay

## 2024-04-06 ENCOUNTER — Ambulatory Visit
Admission: RE | Admit: 2024-04-06 | Discharge: 2024-04-06 | Disposition: A | Discharge status: Home / Self Care | Source: Ambulatory Visit | Attending: Radiation Oncology | Admitting: Radiation Oncology

## 2024-04-06 DIAGNOSIS — Z510 Encounter for antineoplastic radiation therapy: Secondary | ICD-10-CM | POA: Diagnosis not present

## 2024-04-06 LAB — RAD ONC ARIA SESSION SUMMARY
Course Elapsed Days: 25
Plan Fractions Treated to Date: 18
Plan Prescribed Dose Per Fraction: 2.5 Gy
Plan Total Fractions Prescribed: 28
Plan Total Prescribed Dose: 70 Gy
Reference Point Dosage Given to Date: 45 Gy
Reference Point Session Dosage Given: 2.5 Gy
Session Number: 18

## 2024-04-07 ENCOUNTER — Other Ambulatory Visit: Payer: Self-pay

## 2024-04-07 ENCOUNTER — Ambulatory Visit
Admission: RE | Admit: 2024-04-07 | Discharge: 2024-04-07 | Disposition: A | Discharge status: Home / Self Care | Source: Ambulatory Visit | Attending: Radiation Oncology

## 2024-04-07 DIAGNOSIS — Z510 Encounter for antineoplastic radiation therapy: Secondary | ICD-10-CM | POA: Diagnosis not present

## 2024-04-07 LAB — RAD ONC ARIA SESSION SUMMARY
Course Elapsed Days: 26
Plan Fractions Treated to Date: 19
Plan Prescribed Dose Per Fraction: 2.5 Gy
Plan Total Fractions Prescribed: 28
Plan Total Prescribed Dose: 70 Gy
Reference Point Dosage Given to Date: 47.5 Gy
Reference Point Session Dosage Given: 2.5 Gy
Session Number: 19

## 2024-04-08 ENCOUNTER — Other Ambulatory Visit: Payer: Self-pay

## 2024-04-08 ENCOUNTER — Ambulatory Visit
Admission: RE | Admit: 2024-04-08 | Discharge: 2024-04-08 | Disposition: A | Discharge status: Home / Self Care | Source: Ambulatory Visit | Attending: Radiation Oncology | Admitting: Radiation Oncology

## 2024-04-08 DIAGNOSIS — Z510 Encounter for antineoplastic radiation therapy: Secondary | ICD-10-CM | POA: Diagnosis not present

## 2024-04-08 LAB — RAD ONC ARIA SESSION SUMMARY
Course Elapsed Days: 27
Plan Fractions Treated to Date: 20
Plan Prescribed Dose Per Fraction: 2.5 Gy
Plan Total Fractions Prescribed: 28
Plan Total Prescribed Dose: 70 Gy
Reference Point Dosage Given to Date: 50 Gy
Reference Point Session Dosage Given: 2.5 Gy
Session Number: 20

## 2024-04-09 ENCOUNTER — Other Ambulatory Visit: Payer: Self-pay | Admitting: Radiation Oncology

## 2024-04-09 ENCOUNTER — Ambulatory Visit
Admission: RE | Admit: 2024-04-09 | Discharge: 2024-04-09 | Disposition: A | Discharge status: Home / Self Care | Source: Ambulatory Visit | Attending: Radiation Oncology | Admitting: Radiation Oncology

## 2024-04-09 ENCOUNTER — Other Ambulatory Visit: Payer: Self-pay

## 2024-04-09 DIAGNOSIS — Z510 Encounter for antineoplastic radiation therapy: Secondary | ICD-10-CM | POA: Diagnosis not present

## 2024-04-09 LAB — RAD ONC ARIA SESSION SUMMARY
Course Elapsed Days: 28
Plan Fractions Treated to Date: 21
Plan Prescribed Dose Per Fraction: 2.5 Gy
Plan Total Fractions Prescribed: 28
Plan Total Prescribed Dose: 70 Gy
Reference Point Dosage Given to Date: 52.5 Gy
Reference Point Session Dosage Given: 2.5 Gy
Session Number: 21

## 2024-04-09 MED ORDER — SUCRALFATE 1 G PO TABS: 1.0000 g | ORAL_TABLET | Freq: Three times a day (TID) | ORAL | 2 refills | Status: AC

## 2024-04-10 ENCOUNTER — Other Ambulatory Visit: Payer: Self-pay

## 2024-04-10 ENCOUNTER — Ambulatory Visit
Admission: RE | Admit: 2024-04-10 | Discharge: 2024-04-10 | Disposition: A | Discharge status: Home / Self Care | Source: Ambulatory Visit | Attending: Radiation Oncology | Admitting: Radiation Oncology

## 2024-04-10 DIAGNOSIS — Z510 Encounter for antineoplastic radiation therapy: Secondary | ICD-10-CM | POA: Diagnosis not present

## 2024-04-10 LAB — RAD ONC ARIA SESSION SUMMARY
Course Elapsed Days: 29
Plan Fractions Treated to Date: 22
Plan Prescribed Dose Per Fraction: 2.5 Gy
Plan Total Fractions Prescribed: 28
Plan Total Prescribed Dose: 70 Gy
Reference Point Dosage Given to Date: 55 Gy
Reference Point Session Dosage Given: 2.5 Gy
Session Number: 22

## 2024-04-13 ENCOUNTER — Ambulatory Visit
Admission: RE | Admit: 2024-04-13 | Discharge: 2024-04-13 | Disposition: A | Discharge status: Home / Self Care | Source: Ambulatory Visit | Attending: Radiation Oncology

## 2024-04-13 ENCOUNTER — Other Ambulatory Visit: Payer: Self-pay

## 2024-04-13 DIAGNOSIS — Z510 Encounter for antineoplastic radiation therapy: Secondary | ICD-10-CM | POA: Diagnosis not present

## 2024-04-13 LAB — RAD ONC ARIA SESSION SUMMARY
Course Elapsed Days: 32
Plan Fractions Treated to Date: 23
Plan Prescribed Dose Per Fraction: 2.5 Gy
Plan Total Fractions Prescribed: 28
Plan Total Prescribed Dose: 70 Gy
Reference Point Dosage Given to Date: 57.5 Gy
Reference Point Session Dosage Given: 2.5 Gy
Session Number: 23

## 2024-04-14 ENCOUNTER — Ambulatory Visit
Admission: RE | Admit: 2024-04-14 | Discharge: 2024-04-14 | Disposition: A | Discharge status: Home / Self Care | Source: Ambulatory Visit | Attending: Radiation Oncology

## 2024-04-14 ENCOUNTER — Other Ambulatory Visit: Payer: Self-pay

## 2024-04-14 DIAGNOSIS — Z510 Encounter for antineoplastic radiation therapy: Secondary | ICD-10-CM | POA: Diagnosis not present

## 2024-04-14 LAB — RAD ONC ARIA SESSION SUMMARY
Course Elapsed Days: 33
Plan Fractions Treated to Date: 24
Plan Prescribed Dose Per Fraction: 2.5 Gy
Plan Total Fractions Prescribed: 28
Plan Total Prescribed Dose: 70 Gy
Reference Point Dosage Given to Date: 60 Gy
Reference Point Session Dosage Given: 2.5 Gy
Session Number: 24

## 2024-04-15 ENCOUNTER — Ambulatory Visit
Admission: RE | Admit: 2024-04-15 | Discharge: 2024-04-15 | Disposition: A | Discharge status: Home / Self Care | Source: Ambulatory Visit | Attending: Radiation Oncology

## 2024-04-15 ENCOUNTER — Other Ambulatory Visit: Payer: Self-pay

## 2024-04-15 DIAGNOSIS — Z510 Encounter for antineoplastic radiation therapy: Secondary | ICD-10-CM | POA: Diagnosis not present

## 2024-04-15 LAB — LAB REPORT - SCANNED: EGFR: 55

## 2024-04-15 LAB — RAD ONC ARIA SESSION SUMMARY
Course Elapsed Days: 34
Plan Fractions Treated to Date: 25
Plan Prescribed Dose Per Fraction: 2.5 Gy
Plan Total Fractions Prescribed: 28
Plan Total Prescribed Dose: 70 Gy
Reference Point Dosage Given to Date: 62.5 Gy
Reference Point Session Dosage Given: 2.5 Gy
Session Number: 25

## 2024-04-16 ENCOUNTER — Ambulatory Visit
Admission: RE | Admit: 2024-04-16 | Discharge: 2024-04-16 | Disposition: A | Discharge status: Home / Self Care | Source: Ambulatory Visit | Attending: Radiation Oncology | Admitting: Radiation Oncology

## 2024-04-16 ENCOUNTER — Other Ambulatory Visit: Payer: Self-pay

## 2024-04-16 DIAGNOSIS — Z510 Encounter for antineoplastic radiation therapy: Secondary | ICD-10-CM | POA: Diagnosis not present

## 2024-04-16 LAB — RAD ONC ARIA SESSION SUMMARY
Course Elapsed Days: 35
Plan Fractions Treated to Date: 26
Plan Prescribed Dose Per Fraction: 2.5 Gy
Plan Total Fractions Prescribed: 28
Plan Total Prescribed Dose: 70 Gy
Reference Point Dosage Given to Date: 65 Gy
Reference Point Session Dosage Given: 2.5 Gy
Session Number: 26

## 2024-04-17 ENCOUNTER — Other Ambulatory Visit: Payer: Self-pay

## 2024-04-17 ENCOUNTER — Ambulatory Visit
Admission: RE | Admit: 2024-04-17 | Discharge: 2024-04-17 | Disposition: A | Discharge status: Home / Self Care | Source: Ambulatory Visit | Attending: Radiation Oncology | Admitting: Radiation Oncology

## 2024-04-17 DIAGNOSIS — Z510 Encounter for antineoplastic radiation therapy: Secondary | ICD-10-CM | POA: Diagnosis not present

## 2024-04-17 LAB — RAD ONC ARIA SESSION SUMMARY
Course Elapsed Days: 36
Plan Fractions Treated to Date: 27
Plan Prescribed Dose Per Fraction: 2.5 Gy
Plan Total Fractions Prescribed: 28
Plan Total Prescribed Dose: 70 Gy
Reference Point Dosage Given to Date: 67.5 Gy
Reference Point Session Dosage Given: 2.5 Gy
Session Number: 27

## 2024-04-20 ENCOUNTER — Ambulatory Visit
Admission: RE | Admit: 2024-04-20 | Discharge: 2024-04-20 | Disposition: A | Discharge status: Home / Self Care | Source: Ambulatory Visit | Attending: Radiation Oncology | Admitting: Radiation Oncology

## 2024-04-20 ENCOUNTER — Other Ambulatory Visit: Payer: Self-pay

## 2024-04-20 DIAGNOSIS — C61 Malignant neoplasm of prostate: Secondary | ICD-10-CM

## 2024-04-20 DIAGNOSIS — Z510 Encounter for antineoplastic radiation therapy: Secondary | ICD-10-CM | POA: Diagnosis not present

## 2024-04-20 LAB — RAD ONC ARIA SESSION SUMMARY
Course Elapsed Days: 39
Plan Fractions Treated to Date: 28
Plan Prescribed Dose Per Fraction: 2.5 Gy
Plan Total Fractions Prescribed: 28
Plan Total Prescribed Dose: 70 Gy
Reference Point Dosage Given to Date: 70 Gy
Reference Point Session Dosage Given: 2.5 Gy
Session Number: 28

## 2024-04-22 NOTE — Radiation Completion Notes (Addendum)
  Radiation Oncology         [phone] ________________________________  Name: Dale [name] MRN: [id number]  Date: 04/20/2024  DOB: July 08, 1946  Referring Physician: Christina Coyer, M.D. Date of Service: 2024-04-22 Radiation Oncologist: Bartholome Ligas, M.D. [facility]     RADIATION ONCOLOGY END OF TREATMENT NOTE     Diagnosis: 78 y.o. gentleman with Stage T1c adenocarcinoma of the prostate with Gleason score of 4+3, and PSA of 13.2.   Intent: Curative     ==========DELIVERED PLANS==========  First Treatment Date: 2024-03-12 Last Treatment Date: 2024-04-20   Plan Name: Prostate_L2 Site: Prostate Technique: IMRT Mode: Photon Dose Per Fraction: 2.5 Gy Prescribed Dose (Delivered / Prescribed): 70 Gy / 70 Gy Prescribed Fxs (Delivered / Prescribed): 28 / 28     ==========ON TREATMENT VISIT DATES========== 2024-03-13, 2024-03-20, 2024-03-27, 2024-04-03, 2024-04-09, 2024-04-17     See weekly On Treatment Notes in Epic for details in the Media tab (listed as Progress notes on the On Treatment Visit Dates listed above).  He tolerated the treatments well with only modest fatigue.  The patient will receive a call in about one month from the radiation oncology department. He will continue follow up with his urologist, Dr. Derrick Fling, as well.  ------------------------------------------------   Kenith Payer, MD [facility]  Radiation Oncology Direct Dial: [phone]  Fax: [phone] [email or web address]  Skype  LinkedIn

## 2024-04-23 ENCOUNTER — Telehealth (HOSPITAL_BASED_OUTPATIENT_CLINIC_OR_DEPARTMENT_OTHER): Payer: Self-pay | Admitting: Cardiology

## 2024-04-23 NOTE — Telephone Encounter (Signed)
 Called and spoke to patient. Patient verified with 2 identifiers (DOB and Last Name)  Patient states:  - DX with prostate CA and has been going thorough radiation.  - Feels dizzy when he gets up, moves around or bends over.  - Pt went to pick up meds yesterday and when he turned around, he almost collapsed but held onto ledge for balance.  - When walking into building to get meds yesterday, BLE from the knees down felt weak and tingly.  - Pt feels this dizziness could be med related.  - Pt has been exercising and HR is 80-90s which is "high for him".. Has been trying to exercise more since finishing radiation.  - Pt on Orgovxy  - Pt has had a few med changes involving increased dosage and pt is concerned this may contribute to his symptoms of dizziness; Flomax is now BID.   Nurse's Recommendations:  - Change positions slowly.  - Hydrate adequately.  - Will route this info to APP for appt tomorrow.  Patient verbalized understanding.

## 2024-04-23 NOTE — Telephone Encounter (Signed)
 Pt c/o BP issue: STAT if pt c/o blurred vision, one-sided weakness or slurred speech.  STAT if BP is GREATER than 180/120 TODAY.  STAT if BP is LESS than 90/60 and SYMPTOMATIC TODAY  1. What is your BP concern?   Wife Kenney Peacemaker) is concerned patient has been having dizziness   2. Have you taken any BP medication today?  Yes  3. What are your last 5 BP readings?    124/74 (sitting - yesterday)  4. Are you having any other symptoms (ex. Dizziness, headache, blurred vision, passed out)?   Dizziness  Wife Kenney Peacemaker) stated patient gets orthostatic dizziness. Wife wants patient called directly at [phone].  Patient has appointment schedule 5/23 at 3:35 pm

## 2024-04-24 ENCOUNTER — Encounter (HOSPITAL_BASED_OUTPATIENT_CLINIC_OR_DEPARTMENT_OTHER): Payer: Self-pay | Admitting: Family

## 2024-04-24 ENCOUNTER — Ambulatory Visit (HOSPITAL_BASED_OUTPATIENT_CLINIC_OR_DEPARTMENT_OTHER): Admitting: Family

## 2024-04-24 VITALS — BP 118/56 | HR 62 | Ht 67.0 in | Wt 152.4 lb

## 2024-04-24 DIAGNOSIS — I493 Ventricular premature depolarization: Secondary | ICD-10-CM

## 2024-04-24 DIAGNOSIS — I951 Orthostatic hypotension: Secondary | ICD-10-CM

## 2024-04-24 DIAGNOSIS — I25118 Atherosclerotic heart disease of native coronary artery with other forms of angina pectoris: Secondary | ICD-10-CM

## 2024-04-24 DIAGNOSIS — E785 Hyperlipidemia, unspecified: Secondary | ICD-10-CM

## 2024-04-24 DIAGNOSIS — R42 Dizziness and giddiness: Secondary | ICD-10-CM | POA: Diagnosis not present

## 2024-04-24 NOTE — Patient Instructions (Addendum)
 Medication Instructions:  Continue your current medications.   *If you need a refill on your cardiac medications before your next appointment, please call your pharmacy*  Lab Work: Your physician recommends that you return for lab work today: CBC, magnesium  If you have labs (blood work) drawn today and your tests are completely normal, you will receive your results only by: MyChart Message (if you have MyChart) OR A paper copy in the mail If you have any lab test that is abnormal or we need to change your treatment, we will call you to review the results.  Procedures  Your EKG today showed sinus bradycardia 55bpm (a normal but slightly slow heart beat) and normal QTc.  Your physician has requested that you have an echocardiogram. Echocardiography is a painless test that uses sound waves to create images of your heart. It provides your doctor with information about the size and shape of your heart and how well your heart's chambers and valves are working. This procedure takes approximately one hour. There are no restrictions for this procedure. Please do NOT wear cologne, perfume, aftershave, or lotions (deodorant is allowed). Please arrive 15 minutes prior to your appointment time.  Please note: We ask at that you not bring children with you during ultrasound (echo/ vascular) testing. Due to room size and safety concerns, children are not allowed in the ultrasound rooms during exams. Our front office staff cannot provide observation of children in our lobby area while testing is being conducted. An adult accompanying a patient to their appointment will only be allowed in the ultrasound room at the discretion of the ultrasound technician under special circumstances. We apologize for any inconvenience.   Follow-Up: At [facility], you and your health needs are our priority.  As part of our continuing mission to provide you with exceptional heart care, our providers are all part of one  team.  This team includes your primary Cardiologist (physician) and Advanced Practice Providers or APPs (Physician Assistants and Nurse Practitioners) who all work together to provide you with the care you need, when you need it.  Your next appointment:   August or September 2025  Provider:   Sheryle Donning, MD, Slater Duncan, NP, or Neomi Banks, NP    We recommend signing up for the patient portal called "MyChart".  Sign up information is provided on this After Visit Summary.  MyChart is used to connect with patients for Virtual Visits (Telemedicine).  Patients are able to view lab/test results, encounter notes, upcoming appointments, etc.  Non-urgent messages can be sent to your provider as well.   To learn more about what you can do with MyChart, go to [email or web address].   Other Instructions  To help with lightheadedness/dizziness: Recommend aiming for 64oz - 80oz water  per day.  Continue to eat regular meals. Wear compression stockings (15-37mmHg) during the day. Make position changes slowly.

## 2024-04-24 NOTE — Progress Notes (Unsigned)
 Cardiology Office Note:  .   Date:  04/27/2024  ID:  Dale [name], DOB 01-04-1946, MRN [id number] PCP: Jimmey Mould, MD  [facility] Providers Cardiologist:  Sheryle Donning, MD    History of Present Illness: .   Dale [name] is a 78 y.o. male with hx of nonobstructive CAD, HLD, PVC, intermittent bradycardia, intermittent lightheadedness, hypertension.   Metoprolol  previously reduced for hypotension. Dosing of Metoprolol  has been carefully balanced with hypotension, bradycardia, and PVC (last monitor 05/2021 PVC burden 10%).  He contacted the office yesterday noting dizziness with BP 124/74. Dizziness noted when he gets up, moved, or bends. Going through radiation and has been working to exercise more. Notes dizziness is more severe and lasted longer than previous episodes. Had positive orthostatic vital signs at recent PCP evaluation. Yesterday went to urology to pick up medication and felt he would've fallen if he didn't have a counter to hold onto. Also noted some tingling in his legs when walking. Presently on Orgovyx (conecern for QT prolongation as well as hemoglobin drop) and would like updated labs. He is on a nearly [place] diet related to stomach upset from medication changes with six small meals per day. He is drinking water  throughout the day about three 16 oz bottlers of water . Notes with a little bit of exercise now his heart rate will jump up to 80-90s which is higher than usual, discussed as he is getting back to activity as he gradually increases this will resolve.   Labs via [facility]: 04/15/24 creatinine 1.33, bun 29, gfr 55, k 4.5 normal ast/alt  ROS: Please see the history of present illness.    All other systems reviewed and are negative.   Studies Reviewed: Aaron Aas   EKG Interpretation Date/Time:  Friday Apr 24 2024 16:23:09 EDT Ventricular Rate:  55 PR Interval:  196 QRS Duration:  94 QT Interval:  440 QTC Calculation: 420 R Axis:   65  Text  Interpretation: Sinus bradycardia Normal QTc No acute ST/T wave changes Confirmed by Neomi Banks ([id number]) on 04/24/2024 4:24:36 PM    Cardiac Studies & Procedures   ______________________________________________________________________________________________   STRESS TESTS  EXERCISE TOLERANCE TEST (ETT) 07/11/2022  Narrative   Excellent exercise capacity, achieved 12.1 METS   Peak heart rate 144 bpm (98% max age-predicted heart rate)   Hypertensive response to exercise, peak blood pressure 206/101 mmHg   Frequent PVCs, at rest and during stress/recovery   up sloping ST depression was noted.  No evidence of ischemia      MONITORS  LONG TERM MONITOR (3-14 DAYS) 06/14/2021  Narrative  Basic underlying rhythm is normal sinus rhythm and sinus bradycardia with an average heart rate of 58 bpm.  Relatively high burden of isolated premature ventricular contractions, 10%  Accelerated idioventricular rhythm, asymptomatic. There was 1 instance.  No Atrial fibrillation.  Rare salvos of SVT up to 5 beats.   Patch Wear Time:  14 days and 0 hours (2022-06-18T21:51:14-0400 to 2022-07-02T21:51:18-0400)  Patient had a min HR of 35 bpm, max HR of 145 bpm, and avg HR of 58 bpm. Predominant underlying rhythm was Sinus Rhythm. 5 Supraventricular Tachycardia runs occurred, the run with the fastest interval lasting 4 beats with a max rate of 126 bpm, the longest lasting 5 beats with an avg rate of 106 bpm. Some episodes of Supraventricular Tachycardia may be possible Atrial Tachycardia with variable block. Idioventricular Rhythm was present. Isolated SVEs were rare (<1.0%), SVE Couplets were rare (<1.0%), and SVE Triplets  were rare (<1.0%). Isolated VEs were frequent (10.0%, [id number]), VE Couplets were rare (<1.0%, 428), and VE Triplets were rare (<1.0%, 41). Ventricular Bigeminy and Trigeminy were present.        ______________________________________________________________________________________________      Risk Assessment/Calculations:            Physical Exam:   VS:  BP (!) 118/56 (BP Location: Left Arm, Patient Position: Sitting, Cuff Size: Normal)   Pulse 62   Ht 5\' 7"  (1.702 m)   Wt 152 lb 6.4 oz (69.1 kg)   SpO2 90%   BMI 23.87 kg/m    Wt Readings from Last 3 Encounters:  04/24/24 152 lb 6.4 oz (69.1 kg)  02/28/24 151 lb (68.5 kg)  01/29/24 157 lb 8 oz (71.4 kg)    GEN: Well nourished, well developed in no acute distress NECK: No JVD; No carotid bruits CARDIAC: RRR, no murmurs, rubs, gallops RESPIRATORY:  Clear to auscultation without rales, wheezing or rhonchi  ABDOMEN: Soft, non-tender, non-distended EXTREMITIES:  No edema; No deformity   ASSESSMENT AND PLAN: .    HTN / Intermittent lightheadedness / Orthostatic hypotension - increased frequency, severity, and duration of lightheadedness. Orthostatic vital signs positive at recent PCP visit. Recommend 64-80 oz fluid per day, knee high compression stockings during daytime, and slow position changes. CBC, magnesium  today. Recent labs with PCP creatinine 1.33 consistent with dehydration. At this point his only antihypertensive is Toprol  25mg  daily which has limited BP lowering effect and is helping palpitations, as below. Will plan for echocardiogram to rule out structural abnormalities.   HLD / Nonobstructive CAD - Stable with no anginal symptoms. No indication for ischemic evaluation.  Continue Aspirin  EC 81mg  daily, Rosuvastatin  5mg  daily.   Prostate cancer - presently on Orgovyx. QTc normal on EKG today. Check CBC due to lightheadedness with common side effects of Orgovyx being anemia.  PVC / intermittent sinus bradycardia - Discussed further reducing Toprol  dose due to lightheadedness. Given palpitations at onset of exercise, elects to continue Toprol  25mg  daily. EKG today SB 55 bpm.        Dispo: follow up in 3-4  months with Dr. Veryl Gottron or APP  Signed, Clearnce Curia, NP

## 2024-04-25 LAB — MAGNESIUM: Magnesium: 2.3 mg/dL (ref 1.6–2.3)

## 2024-04-25 LAB — CBC
Hematocrit: 29.4 % — ABNORMAL LOW (ref 37.5–51.0)
Hemoglobin: 10.3 g/dL — ABNORMAL LOW (ref 13.0–17.7)
MCH: 36.1 pg — ABNORMAL HIGH (ref 26.6–33.0)
MCHC: 35 g/dL (ref 31.5–35.7)
MCV: 103 fL — ABNORMAL HIGH (ref 79–97)
Platelets: 156 10*3/uL (ref 150–450)
RBC: 2.85 x10E6/uL — ABNORMAL LOW (ref 4.14–5.80)
RDW: 13.2 % (ref 11.6–15.4)
WBC: 5.3 10*3/uL (ref 3.4–10.8)

## 2024-04-27 ENCOUNTER — Ambulatory Visit (HOSPITAL_BASED_OUTPATIENT_CLINIC_OR_DEPARTMENT_OTHER): Payer: Self-pay | Admitting: Family

## 2024-04-27 ENCOUNTER — Encounter (HOSPITAL_BASED_OUTPATIENT_CLINIC_OR_DEPARTMENT_OTHER): Payer: Self-pay | Admitting: Family

## 2024-05-01 NOTE — Progress Notes (Signed)
 Patient was a RadOnc Consult on 12/31/23 for his stage T1c adenocarcinoma of the prostate with Gleason score of 4+3, and PSA of 13.2.  Patient proceed with treatment recommendations of ST-ADT concurrent with 5.5 weeks of external beam therapy and had his final radiation treatment on 04/20/24.   Patient is scheduled for a post treatment nurse call on 05/19/24 and has active follow up's with urology.  Next follow up on 6/18.   RN left message for call back for any questions post treatment.

## 2024-05-12 NOTE — Progress Notes (Signed)
  Radiation Oncology         [phone]) [phone] ________________________________  Name: Dale [name] MRN: [id number]  Date of Service: 05/13/2023 (by pt req)  DOB: [birth year]  Post Treatment Telephone Note  Diagnosis:  Malignant neoplasm of prostate (as documented in provider EOT note)  Pre Treatment IPSS Score: 9 (as documented in the provider consult note)  The patient was available for call today.   Symptoms of fatigue have improved since completing therapy.  Symptoms of bladder changes have improved since completing therapy. Current symptoms include polyuria, and medications for bladder symptoms include Tamsulosin.  Symptoms of bowel changes have improved since completing therapy. Current symptoms include none, and medications for bowel symptoms include Miralax  / Colace to keep things moving smoothly.   Post Treatment IPSS Score: IPSS Questionnaire (AUA-7): Over the past month.   1)  How often have you had a sensation of not emptying your bladder completely after you finish urinating?  0 - Not at all  2)  How often have you had to urinate again less than two hours after you finished urinating? 5 - Almost always  3)  How often have you found you stopped and started again several times when you urinated?  0 - Not at all  4) How difficult have you found it to postpone urination?  1 - Less than 1 time in 5  5) How often have you had a weak urinary stream?  2 - Less than half the time  6) How often have you had to push or strain to begin urination?  1 - Less than 1 time in 5  7) How many times did you most typically get up to urinate from the time you went to bed until the time you got up in the morning?  5 - 5+ times  Total score:  14. Which indicates moderate symptoms  0-7 mildly symptomatic   8-19 moderately symptomatic   20-35 severely symptomatic   Patient has a scheduled follow up visit with his urologist, Dr. Derrick Fling, on 05/20/2024 for ongoing surveillance. He was  counseled that PSA levels will be drawn in the urology office, and was reassured that additional time is expected to improve bowel and bladder symptoms. He was encouraged to call back with concerns or questions regarding radiation.   This concludes the interaction.  Avery Bodo, LPN

## 2024-05-15 ENCOUNTER — Other Ambulatory Visit: Payer: Self-pay | Admitting: Urology

## 2024-05-15 DIAGNOSIS — C61 Malignant neoplasm of prostate: Secondary | ICD-10-CM

## 2024-05-19 ENCOUNTER — Ambulatory Visit
Admission: RE | Admit: 2024-05-19 | Discharge: 2024-05-19 | Disposition: A | Discharge status: Home / Self Care | Source: Ambulatory Visit | Attending: Internal Medicine | Admitting: Internal Medicine

## 2024-05-19 DIAGNOSIS — C61 Malignant neoplasm of prostate: Secondary | ICD-10-CM | POA: Insufficient documentation

## 2024-05-19 DIAGNOSIS — R351 Nocturia: Secondary | ICD-10-CM | POA: Insufficient documentation

## 2024-05-19 DIAGNOSIS — Z510 Encounter for antineoplastic radiation therapy: Secondary | ICD-10-CM | POA: Insufficient documentation

## 2024-05-21 ENCOUNTER — Ambulatory Visit (HOSPITAL_BASED_OUTPATIENT_CLINIC_OR_DEPARTMENT_OTHER)

## 2024-05-21 DIAGNOSIS — I25118 Atherosclerotic heart disease of native coronary artery with other forms of angina pectoris: Secondary | ICD-10-CM | POA: Diagnosis not present

## 2024-05-21 DIAGNOSIS — R42 Dizziness and giddiness: Secondary | ICD-10-CM

## 2024-05-21 DIAGNOSIS — I493 Ventricular premature depolarization: Secondary | ICD-10-CM

## 2024-05-21 LAB — ECHOCARDIOGRAM COMPLETE
AV Vena cont: 0.23 cm
Area-P 1/2: 3.65 cm2
P 1/2 time: 542 ms
S' Lateral: 2.96 cm

## 2024-07-30 ENCOUNTER — Encounter: Payer: Self-pay | Admitting: *Deleted

## 2024-07-31 ENCOUNTER — Encounter: Payer: Self-pay | Admitting: *Deleted

## 2024-08-07 ENCOUNTER — Encounter: Payer: Self-pay | Admitting: *Deleted

## 2024-08-07 ENCOUNTER — Inpatient Hospital Stay: Attending: Adult Health | Admitting: *Deleted

## 2024-08-07 DIAGNOSIS — C61 Malignant neoplasm of prostate: Secondary | ICD-10-CM

## 2024-08-07 NOTE — Progress Notes (Addendum)
 SCP reviewed and completed. Allergies and meds reviewed and updated. Pt does have fatigue rates 4/10. He believes it's from the hormone therapy. Pt has pain to right lung area and will soon have PT for that area. He also complained of general muscle pain, rates 4/10.Pt denies smoking and drinking. Last colonoscopy was 3.31.2015. Repeat will be 9.15.2025, along with an endoscopy. Most recent PSA was <0.1 at [facility]. Recheck PSA at [facility] in November. Pt took his last Orgovyx on 9/4. Pt says he is hoping to gain some muscle back since he no longer will be on the Orgovyx. Pt goes to dermatologist every 6 months for skin check because of hx of basal cell carcinoma. Pt will see PCP in October for wellness checkup.

## 2024-08-18 NOTE — Progress Notes (Signed)
  Cardiology Office Note:  .    Date:  08/19/2024  ID:  Dale [name], DOB 29-Jan-1946, MRN [id number] PCP: Okey Carlin Redbird, MD  [facility] Providers Cardiologist:  Shelda Bruckner, MD     History of Present Illness: .    Dale [name] is a 78 y.o. male with a hx of chest pain with abnormal nuclear study with inferolateral and apical ischemia, nonobstructive epicardial coronaries, systolic murmur, hypertension, hyperlipidemia, and resting bradycardia. He was a previous patient of Dr. Claudene. I initially saw him 03/20/2023 for the evaluation and management of hypertension.    Today: Saw Dr Okey recently, had chest wall tenderness. Xray reportedly unremarkable.   Has been struggling with GERD, just had EGD/colonoscopy.   Brings blood pressure log from home. Generally well controlled, lowest 96/64, highest 154/81, most 120s/70.  Has gone on iron supplements, eating more read meat to try to improve his anemia, and he has seen improvement in his numbers. He is now off orgovyx for his prostate cancer. Dealing with symptoms of low testosterone .  Walks 1.5 miles, gets heart rate into the 80s. Lightheadedness is less frequent/severe. Working to stay hydrated.  ROS:  Denies shortness of breath at rest or with normal exertion. No PND, orthopnea, LE edema or unexpected weight gain. No syncope or palpitations. ROS otherwise negative except as noted.   Studies Reviewed: SABRA         Physical Exam:    VS:  BP 128/69 Comment: home  Pulse 66   Ht 5' 7 (1.702 m)   Wt 157 lb 6.4 oz (71.4 kg)   SpO2 99% Comment: RA  BMI 24.65 kg/m    Wt Readings from Last 3 Encounters:  08/19/24 157 lb 6.4 oz (71.4 kg)  04/24/24 152 lb 6.4 oz (69.1 kg)  02/28/24 151 lb (68.5 kg)    GEN: Well nourished, well developed in no acute distress HEENT: Normal, moist mucous membranes NECK: No JVD CARDIAC: regular rhythm, normal S1 and S2, no rubs or gallops. No murmur. VASCULAR: Radial and  DP pulses 2+ bilaterally. No carotid bruits RESPIRATORY:  Clear to auscultation without rales, wheezing or rhonchi  ABDOMEN: Soft, non-tender, non-distended MUSCULOSKELETAL:  Ambulates independently SKIN: Warm and dry, no edema NEUROLOGIC:  Alert and oriented x 3. No focal neuro deficits noted. PSYCHIATRIC:  Normal affect    ASSESSMENT AND PLAN: .    Intermittent lightheadedness Hypertension -echo in 05/2024 normal -hypertension at goal today, continue metoprolol  succinate 25 mg daily. Have discussed hydration to avoid orthostasis, he is working on this now -was anemic on labs 04/2024, improving with increasing iron   PVCs Intermittent bradycardia -he is asymptomatic with his PVCs. Last burden by monitor 10% -doing well with decreased metoprolol  dose. Balance between PVCs and bradycardia -can augment his HR appropriately with exercise   Hyperlipidemia Nonobstructive CAD -continue rosuvastatin  5 mg daily -last LDL 57 per [facility]  Prostate cancer: no longer on orgovyx  Follow up: 6 mos or sooner as needed  Signed, Shelda Bruckner, MD  Shelda Bruckner, MD, PhD, [facility]  [facility]  [place]  [facility] at [facility] [address] [place], [place] [address] [phone]

## 2024-08-19 ENCOUNTER — Ambulatory Visit (HOSPITAL_BASED_OUTPATIENT_CLINIC_OR_DEPARTMENT_OTHER): Admitting: Cardiology

## 2024-08-19 ENCOUNTER — Encounter (HOSPITAL_BASED_OUTPATIENT_CLINIC_OR_DEPARTMENT_OTHER): Payer: Self-pay | Admitting: Cardiology

## 2024-08-19 VITALS — BP 128/69 | HR 66 | Ht 67.0 in | Wt 157.4 lb

## 2024-08-19 DIAGNOSIS — E785 Hyperlipidemia, unspecified: Secondary | ICD-10-CM

## 2024-08-19 DIAGNOSIS — I10 Essential (primary) hypertension: Secondary | ICD-10-CM

## 2024-08-19 DIAGNOSIS — R42 Dizziness and giddiness: Secondary | ICD-10-CM

## 2024-08-19 DIAGNOSIS — I493 Ventricular premature depolarization: Secondary | ICD-10-CM | POA: Diagnosis not present

## 2024-08-19 DIAGNOSIS — I2510 Atherosclerotic heart disease of native coronary artery without angina pectoris: Secondary | ICD-10-CM | POA: Diagnosis not present

## 2024-08-19 NOTE — Patient Instructions (Signed)
 Medication Instructions:   Your physician recommends that you continue on your current medications as directed. Please refer to the Current Medication list given to you today.  *If you need a refill on your cardiac medications before your next appointment, please call your pharmacy*    Follow-Up: At [facility], you and your health needs are our priority.  As part of our continuing mission to provide you with exceptional heart care, our providers are all part of one team.  This team includes your primary Cardiologist (physician) and Advanced Practice Providers or APPs (Physician Assistants and Nurse Practitioners) who all work together to provide you with the care you need, when you need it.  Your next appointment:   6 month(s)  Provider:   Shelda Bruckner, MD, Rosaline Bane, NP, or Reche Finder, NP

## 2024-09-23 ENCOUNTER — Other Ambulatory Visit (HOSPITAL_COMMUNITY): Payer: Self-pay | Admitting: Internal Medicine

## 2024-09-23 DIAGNOSIS — M810 Age-related osteoporosis without current pathological fracture: Secondary | ICD-10-CM | POA: Insufficient documentation

## 2024-09-24 ENCOUNTER — Telehealth (HOSPITAL_COMMUNITY): Payer: Self-pay | Admitting: Pharmacy Technician

## 2024-09-24 NOTE — Telephone Encounter (Signed)
 Auth Submission: APPROVED Site of care: [facility] Payer: [employer] MEDICARE Medication & CPT/J Code(s) submitted: Reclast (Zolendronic acid) [id number] Diagnosis Code: M81.0 Route of submission (phone, fax, portal): portal Phone # Fax # Auth type: Buy/Bill HB Units/visits requested: 5mg  x 1 dose, q 12 months Reference number: [id number] Approval from: 09/24/2024 to 09/24/25        Dagoberto Armour, CPhT [facility] Phone: [phone] 09/24/2024

## 2024-10-06 ENCOUNTER — Inpatient Hospital Stay (HOSPITAL_COMMUNITY): Admission: RE | Admit: 2024-10-06 | Source: Ambulatory Visit

## 2024-10-23 ENCOUNTER — Ambulatory Visit (HOSPITAL_COMMUNITY)
Admission: RE | Admit: 2024-10-23 | Discharge: 2024-10-23 | Disposition: A | Discharge status: Home / Self Care | Source: Ambulatory Visit | Attending: Internal Medicine | Admitting: Internal Medicine

## 2024-10-23 VITALS — BP 166/64 | HR 55 | Temp 98.3°F | Resp 16

## 2024-10-23 DIAGNOSIS — M810 Age-related osteoporosis without current pathological fracture: Secondary | ICD-10-CM | POA: Insufficient documentation

## 2024-10-23 MED ORDER — ACETAMINOPHEN 325 MG PO TABS: 650.0000 mg | ORAL_TABLET | Freq: Once | ORAL | Status: DC

## 2024-10-23 MED ORDER — ZOLEDRONIC ACID 5 MG/100ML IV SOLN
INTRAVENOUS | Status: AC
  Filled 2024-10-23: qty 100

## 2024-10-23 MED ORDER — DIPHENHYDRAMINE HCL 25 MG PO CAPS: 25.0000 mg | ORAL_CAPSULE | Freq: Once | ORAL | Status: DC

## 2024-10-23 MED ORDER — ACETAMINOPHEN 325 MG PO TABS
ORAL_TABLET | ORAL | Status: AC
  Filled 2024-10-23: qty 2

## 2024-10-23 MED ORDER — ZOLEDRONIC ACID 5 MG/100ML IV SOLN
5.0000 mg | Freq: Once | INTRAVENOUS | Status: AC
  Administered 2024-10-23: 5 mg via INTRAVENOUS

## 2024-10-23 MED ORDER — DIPHENHYDRAMINE HCL 25 MG PO CAPS
ORAL_CAPSULE | ORAL | Status: AC
  Filled 2024-10-23: qty 1

## 2024-11-19 ENCOUNTER — Other Ambulatory Visit (HOSPITAL_COMMUNITY): Payer: Self-pay | Admitting: Gastroenterology

## 2024-11-19 ENCOUNTER — Other Ambulatory Visit: Payer: Self-pay | Admitting: Gastroenterology

## 2024-11-19 DIAGNOSIS — R1010 Upper abdominal pain, unspecified: Secondary | ICD-10-CM

## 2024-12-08 ENCOUNTER — Encounter (HOSPITAL_COMMUNITY)
Admission: RE | Admit: 2024-12-08 | Discharge: 2024-12-08 | Disposition: A | Discharge status: Home / Self Care | Source: Ambulatory Visit | Attending: Gastroenterology | Admitting: Gastroenterology

## 2024-12-08 ENCOUNTER — Encounter (HOSPITAL_COMMUNITY): Payer: Self-pay

## 2024-12-08 DIAGNOSIS — R1010 Upper abdominal pain, unspecified: Secondary | ICD-10-CM | POA: Insufficient documentation

## 2024-12-08 MED ORDER — TECHNETIUM TC 99M MEBROFENIN IV KIT
5.0000 | PACK | Freq: Once | INTRAVENOUS | Status: AC
  Administered 2024-12-08: 5.22 via INTRAVENOUS

## 2024-12-18 ENCOUNTER — Other Ambulatory Visit: Payer: Self-pay | Admitting: Gastroenterology

## 2025-02-18 ENCOUNTER — Ambulatory Visit (HOSPITAL_BASED_OUTPATIENT_CLINIC_OR_DEPARTMENT_OTHER): Admitting: Cardiology

## 2025-03-31 ENCOUNTER — Encounter (HOSPITAL_COMMUNITY): Payer: Self-pay

## 2025-03-31 ENCOUNTER — Ambulatory Visit (HOSPITAL_COMMUNITY): Admit: 2025-03-31 | Admitting: Gastroenterology

## 2025-03-31 SURGERY — MONITORING, ESOPHAGEAL PH, 24 HOUR
